# Patient Record
Sex: Male | Born: 1940 | Race: White | Hispanic: No | Marital: Married | Smoking: Never smoker
Health system: Southern US, Community
[De-identification: ages and names within clinical notes are randomized; demographics above are authoritative.]

## PROBLEM LIST (undated history)

## (undated) DIAGNOSIS — E1051 Type 1 diabetes mellitus with diabetic peripheral angiopathy without gangrene: Secondary | ICD-10-CM

## (undated) DIAGNOSIS — I739 Peripheral vascular disease, unspecified: Secondary | ICD-10-CM

## (undated) DIAGNOSIS — M869 Osteomyelitis, unspecified: Secondary | ICD-10-CM

## (undated) DIAGNOSIS — I219 Acute myocardial infarction, unspecified: Secondary | ICD-10-CM

## (undated) DIAGNOSIS — N189 Chronic kidney disease, unspecified: Secondary | ICD-10-CM

## (undated) DIAGNOSIS — D649 Anemia, unspecified: Secondary | ICD-10-CM

## (undated) DIAGNOSIS — E1065 Type 1 diabetes mellitus with hyperglycemia: Secondary | ICD-10-CM

## (undated) DIAGNOSIS — I6529 Occlusion and stenosis of unspecified carotid artery: Secondary | ICD-10-CM

## (undated) DIAGNOSIS — I10 Essential (primary) hypertension: Secondary | ICD-10-CM

## (undated) DIAGNOSIS — E785 Hyperlipidemia, unspecified: Secondary | ICD-10-CM

## (undated) DIAGNOSIS — I4891 Unspecified atrial fibrillation: Secondary | ICD-10-CM

## (undated) DIAGNOSIS — R0602 Shortness of breath: Secondary | ICD-10-CM

## (undated) DIAGNOSIS — I2510 Atherosclerotic heart disease of native coronary artery without angina pectoris: Secondary | ICD-10-CM

## (undated) DIAGNOSIS — I214 Non-ST elevation (NSTEMI) myocardial infarction: Secondary | ICD-10-CM

## (undated) DIAGNOSIS — I255 Ischemic cardiomyopathy: Secondary | ICD-10-CM

## (undated) DIAGNOSIS — N200 Calculus of kidney: Secondary | ICD-10-CM

## (undated) DIAGNOSIS — K5730 Diverticulosis of large intestine without perforation or abscess without bleeding: Secondary | ICD-10-CM

## (undated) DIAGNOSIS — I25810 Atherosclerosis of coronary artery bypass graft(s) without angina pectoris: Secondary | ICD-10-CM

## (undated) HISTORY — DX: Shortness of breath: R06.02

## (undated) HISTORY — DX: Osteomyelitis, unspecified: M86.9

## (undated) HISTORY — DX: Type 1 diabetes mellitus with diabetic peripheral angiopathy without gangrene: E10.51

## (undated) HISTORY — DX: Ischemic cardiomyopathy: I25.5

## (undated) HISTORY — PX: CORONARY ARTERY BYPASS GRAFT: SHX141

## (undated) HISTORY — DX: Occlusion and stenosis of unspecified carotid artery: I65.29

## (undated) HISTORY — DX: Type 1 diabetes mellitus with hyperglycemia: E10.65

## (undated) HISTORY — DX: Peripheral vascular disease, unspecified: I73.9

## (undated) HISTORY — PX: CARDIAC CATHETERIZATION: SHX172

## (undated) HISTORY — DX: Calculus of kidney: N20.0

## (undated) HISTORY — DX: Atherosclerosis of coronary artery bypass graft(s) without angina pectoris: I25.810

## (undated) HISTORY — PX: CATARACT EXTRACTION, BILATERAL: SHX1313

## (undated) HISTORY — DX: Diverticulosis of large intestine without perforation or abscess without bleeding: K57.30

## (undated) HISTORY — DX: Unspecified atrial fibrillation: I48.91

## (undated) HISTORY — PX: KNEE ARTHROPLASTY: SHX992

## (undated) HISTORY — DX: Acute myocardial infarction, unspecified: I21.9

## (undated) HISTORY — PX: EYE SURGERY: SHX253

## (undated) HISTORY — DX: Non-ST elevation (NSTEMI) myocardial infarction: I21.4

## (undated) HISTORY — DX: Hyperlipidemia, unspecified: E78.5

---

## 1998-11-13 HISTORY — PX: CAROTID ENDARTERECTOMY: SUR193

## 1999-03-29 ENCOUNTER — Encounter: Payer: Self-pay | Admitting: *Deleted

## 1999-03-29 ENCOUNTER — Ambulatory Visit (HOSPITAL_COMMUNITY): Admission: RE | Admit: 1999-03-29 | Discharge: 1999-03-29 | Payer: Self-pay | Admitting: Internal Medicine

## 1999-03-29 ENCOUNTER — Inpatient Hospital Stay (HOSPITAL_COMMUNITY): Admission: AD | Admit: 1999-03-29 | Discharge: 1999-04-07 | Payer: Self-pay | Admitting: *Deleted

## 1999-04-01 ENCOUNTER — Encounter: Payer: Self-pay | Admitting: Thoracic Surgery (Cardiothoracic Vascular Surgery)

## 1999-04-02 ENCOUNTER — Encounter: Payer: Self-pay | Admitting: Thoracic Surgery (Cardiothoracic Vascular Surgery)

## 1999-04-03 ENCOUNTER — Encounter: Payer: Self-pay | Admitting: *Deleted

## 1999-04-06 ENCOUNTER — Encounter: Payer: Self-pay | Admitting: Thoracic Surgery (Cardiothoracic Vascular Surgery)

## 2000-01-29 ENCOUNTER — Emergency Department (HOSPITAL_COMMUNITY): Admission: EM | Admit: 2000-01-29 | Discharge: 2000-01-29 | Payer: Self-pay | Admitting: Ophthalmology

## 2004-05-18 ENCOUNTER — Inpatient Hospital Stay (HOSPITAL_COMMUNITY): Admission: EM | Admit: 2004-05-18 | Discharge: 2004-05-20 | Payer: Self-pay

## 2009-05-03 ENCOUNTER — Ambulatory Visit: Payer: Self-pay | Admitting: Gastroenterology

## 2009-05-19 ENCOUNTER — Ambulatory Visit: Payer: Self-pay | Admitting: Gastroenterology

## 2011-02-19 LAB — GLUCOSE, CAPILLARY: Glucose-Capillary: 140 mg/dL — ABNORMAL HIGH (ref 70–99)

## 2011-03-31 NOTE — Discharge Summary (Signed)
Todd Cook, Todd Cook                      ACCOUNT NO.:  [id number]   MEDICAL RECORD NO.:  [id number]                   PATIENT TYPE:  INP   LOCATION:  4732                                 FACILITY:  [facility]   PHYSICIAN:  Erskine Speed, M.D.                DATE OF BIRTH:  1941-10-02   DATE OF ADMISSION:  05/18/2004  DATE OF DISCHARGE:  05/20/2004                                 DISCHARGE SUMMARY   PROBLEMS:  1. Diarrhea.  2. Dehydration.  3. Diabetes.  4. Gout.  5. Kidney stone.   SUBJECTIVE:  This 70 year old patient has a 2-week history of diarrhea and  now nausea and vomiting.  He was seen in the office on May 18, 2004 at 12  p.m., was pale, volume-depleted, hypotensive, and was admitted to [facility] for further evaluation.   HOSPITAL COURSE:  The patient was given IV fluids.  Laboratory testing  including a CT scan of the abdomen (negative), stool evaluation for ova and  parasite and culture and C. difficile (all negative) were performed on  admission.  His hemoglobin on admission was 16; the white count was 14,000.  Glucose was 159.  The patient had already been scheduled for a flexible  sigmoidoscopy on July 8 for colon cancer screening.  On May 20, 2004 the  patient had no nausea or vomiting, one loose stool, vital signs were stable,  the monitor showed normal sinus rhythm, and laboratory workup as noted above  was negative.  The patient was taken down to endoscopy and after standard  preparation with Fleets enemas Dr. Erskine Speed performed flexible  fiberoptic sigmoidoscopy to 75 cm.  The procedure was performed without  complications.  There was no abnormality noted.  There is no polyps, masses,  or mucosal abnormality.  As the patient was feeling much better, diarrhea  had minimized, his volume had been repleted and vital signs were stable, the  patient was ready for discharge on May 20, 2004.   DISCHARGE DIAGNOSES:  As noted above.   CONDITION:   Improved.   COMPLICATIONS:  None.   CONSULTATIONS:  None.   PROCEDURE:  Flexible fiberoptic sigmoidoscopy to 75 cm.   DISCHARGE MEDICATIONS:  The patient will be sent home on all of his usual  medicines.  Imodium if needed.  No pain management was necessary.   ACTIVITY:  As usual.   SPECIAL INSTRUCTIONS:  The patient was to be on clear liquid to full liquid  diet.  On July 9 and July 10, soft chicken baked or grilled and soft potato  could be added.  The patient was to watch his blood sugars carefully.  He  was to call on May 24, 2004 with a progress report or sooner if needed.  Erskine Speed, M.D.    Balinda Quails  D:  06/10/2004  T:  06/10/2004  Job:  [id number]
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
## 2011-09-12 ENCOUNTER — Ambulatory Visit (HOSPITAL_COMMUNITY)
Admission: RE | Admit: 2011-09-12 | Discharge: 2011-09-12 | Disposition: A | Discharge status: Home / Self Care | Payer: Medicare PPO | Source: Ambulatory Visit | Attending: Internal Medicine | Admitting: Internal Medicine

## 2011-09-12 DIAGNOSIS — M79609 Pain in unspecified limb: Secondary | ICD-10-CM | POA: Insufficient documentation

## 2011-09-12 DIAGNOSIS — I739 Peripheral vascular disease, unspecified: Secondary | ICD-10-CM | POA: Insufficient documentation

## 2011-09-12 DIAGNOSIS — L98499 Non-pressure chronic ulcer of skin of other sites with unspecified severity: Secondary | ICD-10-CM | POA: Insufficient documentation

## 2011-09-18 ENCOUNTER — Other Ambulatory Visit: Payer: Self-pay

## 2011-09-18 ENCOUNTER — Inpatient Hospital Stay (HOSPITAL_COMMUNITY)
Admission: EM | Admit: 2011-09-18 | Discharge: 2011-09-29 | DRG: 239 | Disposition: A | Discharge status: Rehabilitation Facility | Payer: Medicare PPO | Attending: Internal Medicine | Admitting: Internal Medicine

## 2011-09-18 ENCOUNTER — Encounter: Payer: Self-pay | Admitting: Emergency Medicine

## 2011-09-18 ENCOUNTER — Emergency Department (HOSPITAL_COMMUNITY): Payer: Medicare PPO

## 2011-09-18 DIAGNOSIS — I255 Ischemic cardiomyopathy: Secondary | ICD-10-CM | POA: Diagnosis present

## 2011-09-18 DIAGNOSIS — I214 Non-ST elevation (NSTEMI) myocardial infarction: Secondary | ICD-10-CM

## 2011-09-18 DIAGNOSIS — M109 Gout, unspecified: Secondary | ICD-10-CM

## 2011-09-18 DIAGNOSIS — Z7982 Long term (current) use of aspirin: Secondary | ICD-10-CM

## 2011-09-18 DIAGNOSIS — N179 Acute kidney failure, unspecified: Secondary | ICD-10-CM | POA: Diagnosis present

## 2011-09-18 DIAGNOSIS — I5022 Chronic systolic (congestive) heart failure: Secondary | ICD-10-CM | POA: Diagnosis present

## 2011-09-18 DIAGNOSIS — Z7902 Long term (current) use of antithrombotics/antiplatelets: Secondary | ICD-10-CM

## 2011-09-18 DIAGNOSIS — K5730 Diverticulosis of large intestine without perforation or abscess without bleeding: Secondary | ICD-10-CM

## 2011-09-18 DIAGNOSIS — L97509 Non-pressure chronic ulcer of other part of unspecified foot with unspecified severity: Secondary | ICD-10-CM | POA: Diagnosis present

## 2011-09-18 DIAGNOSIS — I10 Essential (primary) hypertension: Secondary | ICD-10-CM

## 2011-09-18 DIAGNOSIS — N200 Calculus of kidney: Secondary | ICD-10-CM

## 2011-09-18 DIAGNOSIS — I96 Gangrene, not elsewhere classified: Secondary | ICD-10-CM | POA: Diagnosis present

## 2011-09-18 DIAGNOSIS — M869 Osteomyelitis, unspecified: Secondary | ICD-10-CM

## 2011-09-18 DIAGNOSIS — Z9889 Other specified postprocedural states: Secondary | ICD-10-CM

## 2011-09-18 DIAGNOSIS — E871 Hypo-osmolality and hyponatremia: Secondary | ICD-10-CM | POA: Diagnosis not present

## 2011-09-18 DIAGNOSIS — E1059 Type 1 diabetes mellitus with other circulatory complications: Principal | ICD-10-CM | POA: Diagnosis present

## 2011-09-18 DIAGNOSIS — D62 Acute posthemorrhagic anemia: Secondary | ICD-10-CM | POA: Diagnosis not present

## 2011-09-18 DIAGNOSIS — I2510 Atherosclerotic heart disease of native coronary artery without angina pectoris: Secondary | ICD-10-CM | POA: Diagnosis present

## 2011-09-18 DIAGNOSIS — I798 Other disorders of arteries, arterioles and capillaries in diseases classified elsewhere: Secondary | ICD-10-CM | POA: Diagnosis present

## 2011-09-18 DIAGNOSIS — D509 Iron deficiency anemia, unspecified: Secondary | ICD-10-CM

## 2011-09-18 DIAGNOSIS — N183 Chronic kidney disease, stage 3 unspecified: Secondary | ICD-10-CM | POA: Diagnosis present

## 2011-09-18 DIAGNOSIS — I739 Peripheral vascular disease, unspecified: Secondary | ICD-10-CM

## 2011-09-18 DIAGNOSIS — Z794 Long term (current) use of insulin: Secondary | ICD-10-CM

## 2011-09-18 DIAGNOSIS — Z951 Presence of aortocoronary bypass graft: Secondary | ICD-10-CM

## 2011-09-18 DIAGNOSIS — I4891 Unspecified atrial fibrillation: Secondary | ICD-10-CM | POA: Diagnosis present

## 2011-09-18 DIAGNOSIS — IMO0002 Reserved for concepts with insufficient information to code with codable children: Secondary | ICD-10-CM

## 2011-09-18 DIAGNOSIS — I129 Hypertensive chronic kidney disease with stage 1 through stage 4 chronic kidney disease, or unspecified chronic kidney disease: Secondary | ICD-10-CM | POA: Diagnosis present

## 2011-09-18 DIAGNOSIS — E1051 Type 1 diabetes mellitus with diabetic peripheral angiopathy without gangrene: Secondary | ICD-10-CM

## 2011-09-18 DIAGNOSIS — E876 Hypokalemia: Secondary | ICD-10-CM | POA: Diagnosis not present

## 2011-09-18 DIAGNOSIS — I2589 Other forms of chronic ischemic heart disease: Secondary | ICD-10-CM | POA: Diagnosis present

## 2011-09-18 DIAGNOSIS — I509 Heart failure, unspecified: Secondary | ICD-10-CM | POA: Diagnosis present

## 2011-09-18 HISTORY — DX: Chronic kidney disease, unspecified: N18.9

## 2011-09-18 HISTORY — DX: Essential (primary) hypertension: I10

## 2011-09-18 HISTORY — DX: Diverticulosis of large intestine without perforation or abscess without bleeding: K57.30

## 2011-09-18 HISTORY — DX: Osteomyelitis, unspecified: M86.9

## 2011-09-18 HISTORY — DX: Atherosclerotic heart disease of native coronary artery without angina pectoris: I25.10

## 2011-09-18 HISTORY — DX: Reserved for concepts with insufficient information to code with codable children: IMO0002

## 2011-09-18 HISTORY — DX: Peripheral vascular disease, unspecified: I73.9

## 2011-09-18 HISTORY — DX: Non-ST elevation (NSTEMI) myocardial infarction: I21.4

## 2011-09-18 HISTORY — DX: Calculus of kidney: N20.0

## 2011-09-18 HISTORY — DX: Type 1 diabetes mellitus with diabetic peripheral angiopathy without gangrene: E10.51

## 2011-09-18 HISTORY — DX: Anemia, unspecified: D64.9

## 2011-09-18 LAB — DIFFERENTIAL
Lymphocytes Relative: 6 % — ABNORMAL LOW (ref 12–46)
Monocytes Absolute: 0.6 10*3/uL (ref 0.1–1.0)
Neutro Abs: 7 10*3/uL (ref 1.7–7.7)

## 2011-09-18 LAB — COMPREHENSIVE METABOLIC PANEL
ALT: 21 U/L (ref 0–53)
AST: 27 U/L (ref 0–37)
Albumin: 3 g/dL — ABNORMAL LOW (ref 3.5–5.2)
Alkaline Phosphatase: 92 U/L (ref 39–117)
Calcium: 9.7 mg/dL (ref 8.4–10.5)
Chloride: 102 mEq/L (ref 96–112)
GFR calc non Af Amer: 38 mL/min — ABNORMAL LOW (ref >90)
Glucose, Bld: 475 mg/dL — ABNORMAL HIGH (ref 70–99)
Total Bilirubin: 0.5 mg/dL (ref 0.3–1.2)
Total Protein: 6.2 g/dL (ref 6.0–8.3)

## 2011-09-18 LAB — CBC
Hemoglobin: 11.6 g/dL — ABNORMAL LOW (ref 13.0–17.0)
MCHC: 34.6 g/dL (ref 30.0–36.0)
RBC: 3.81 MIL/uL — ABNORMAL LOW (ref 4.22–5.81)
RDW: 13.5 % (ref 11.5–15.5)
WBC: 8 10*3/uL (ref 4.0–10.5)

## 2011-09-18 LAB — GLUCOSE, CAPILLARY
Glucose-Capillary: 463 mg/dL — ABNORMAL HIGH (ref 70–99)
Glucose-Capillary: 444 mg/dL — ABNORMAL HIGH (ref 70–99)
Glucose-Capillary: 471 mg/dL — ABNORMAL HIGH (ref 70–99)
Glucose-Capillary: 461 mg/dL — ABNORMAL HIGH (ref 70–99)

## 2011-09-18 LAB — CARDIAC PANEL(CRET KIN+CKTOT+MB+TROPI)
Relative Index: 6.6 — ABNORMAL HIGH (ref 0.0–2.5)
Relative Index: 7.2 — ABNORMAL HIGH (ref 0.0–2.5)
Total CK: 171 U/L (ref 7–232)
Troponin I: 4.47 ng/mL (ref <0.30)

## 2011-09-18 LAB — PROCALCITONIN: Procalcitonin: 1.57 ng/mL

## 2011-09-18 MED ORDER — HYDRALAZINE HCL 20 MG/ML IJ SOLN
10.0000 mg | Freq: Once | INTRAMUSCULAR | Status: DC
  Filled 2011-09-18: qty 0.5

## 2011-09-18 MED ORDER — NITROGLYCERIN 0.4 MG SL SUBL: 0.4000 mg | SUBLINGUAL_TABLET | SUBLINGUAL | Status: DC | PRN

## 2011-09-18 MED ORDER — ASPIRIN 81 MG PO CHEW
CHEWABLE_TABLET | ORAL | Status: AC
  Filled 2011-09-18: qty 1

## 2011-09-18 MED ORDER — INSULIN REGULAR HUMAN 100 UNIT/ML IJ SOLN
10.0000 [IU] | Freq: Once | INTRAMUSCULAR | Status: DC
  Filled 2011-09-18: qty 0.1

## 2011-09-18 MED ORDER — SODIUM CHLORIDE 0.9 % IV SOLN: 1000.0000 mL | INTRAVENOUS | Status: AC

## 2011-09-18 MED ORDER — PIPERACILLIN-TAZOBACTAM 3.375 G IVPB 30 MIN
3.3750 g | Freq: Once | INTRAVENOUS | Status: AC
  Administered 2011-09-22: 3.375 g via INTRAVENOUS
  Filled 2011-09-18 (×2): qty 50

## 2011-09-18 MED ORDER — INSULIN ASPART 100 UNIT/ML ~~LOC~~ SOLN
12.0000 [IU] | Freq: Once | SUBCUTANEOUS | Status: DC
  Filled 2011-09-18: qty 3

## 2011-09-18 MED ORDER — ACETAMINOPHEN 325 MG PO TABS
650.0000 mg | ORAL_TABLET | ORAL | Status: DC | PRN
  Administered 2011-09-26: 650 mg via ORAL
  Filled 2011-09-18: qty 2

## 2011-09-18 MED ORDER — SODIUM CHLORIDE 0.9 % IV SOLN
INTRAVENOUS | Status: DC
  Administered 2011-09-18: 22:00:00 via INTRAVENOUS
  Administered 2011-09-19: 125 mL via INTRAVENOUS
  Administered 2011-09-19: 09:00:00 via INTRAVENOUS

## 2011-09-18 MED ORDER — ASPIRIN 81 MG PO CHEW: 324.0000 mg | CHEWABLE_TABLET | ORAL | Status: AC

## 2011-09-18 MED ORDER — SODIUM CHLORIDE 0.9 % IJ SOLN: 3.0000 mL | Freq: Two times a day (BID) | INTRAMUSCULAR | Status: DC

## 2011-09-18 MED ORDER — ASPIRIN 325 MG PO TABS
325.0000 mg | ORAL_TABLET | Freq: Every day | ORAL | Status: DC
  Administered 2011-09-18: 273 mg via ORAL
  Administered 2011-09-19 – 2011-09-23 (×5): 325 mg via ORAL
  Filled 2011-09-18 (×5): qty 1

## 2011-09-18 MED ORDER — SODIUM CHLORIDE 0.9 % IV SOLN
INTRAVENOUS | Status: DC
  Administered 2011-09-18: 3.1 [IU]/h via INTRAVENOUS
  Administered 2011-09-19: 8.3 [IU]/h via INTRAVENOUS
  Filled 2011-09-18 (×2): qty 1

## 2011-09-18 MED ORDER — ROSUVASTATIN CALCIUM 10 MG PO TABS
10.0000 mg | ORAL_TABLET | Freq: Every day | ORAL | Status: DC
  Administered 2011-09-19 – 2011-09-29 (×11): 10 mg via ORAL
  Filled 2011-09-18 (×13): qty 1

## 2011-09-18 MED ORDER — DEXTROSE-NACL 5-0.45 % IV SOLN
INTRAVENOUS | Status: DC
  Administered 2011-09-18 – 2011-09-19 (×2): via INTRAVENOUS

## 2011-09-18 MED ORDER — HEPARIN (PORCINE) IN NACL 100-0.45 UNIT/ML-% IJ SOLN: 14.0000 [IU]/kg/h | INTRAMUSCULAR | Status: DC

## 2011-09-18 MED ORDER — METOPROLOL TARTRATE 1 MG/ML IV SOLN
5.0000 mg | Freq: Four times a day (QID) | INTRAVENOUS | Status: DC
  Administered 2011-09-19: 5 mg via INTRAVENOUS
  Filled 2011-09-18: qty 5

## 2011-09-18 MED ORDER — ALPRAZOLAM 0.25 MG PO TABS
0.2500 mg | ORAL_TABLET | Freq: Two times a day (BID) | ORAL | Status: DC | PRN
  Administered 2011-09-28 (×2): 0.25 mg via ORAL
  Filled 2011-09-18 (×2): qty 1

## 2011-09-18 MED ORDER — DEXTROSE 50 % IV SOLN: 25.0000 mL | INTRAVENOUS | Status: DC | PRN

## 2011-09-18 MED ORDER — VANCOMYCIN HCL 1000 MG IV SOLR
1500.0000 mg | INTRAVENOUS | Status: DC
  Filled 2011-09-18: qty 1500

## 2011-09-18 MED ORDER — SODIUM CHLORIDE 0.9 % IV BOLUS (SEPSIS)
2000.0000 mL | Freq: Once | INTRAVENOUS | Status: AC
  Administered 2011-09-18: 2000 mL via INTRAVENOUS

## 2011-09-18 MED ORDER — SODIUM CHLORIDE 0.9 % IV SOLN: 1000.0000 mL | INTRAVENOUS | Status: DC

## 2011-09-18 MED ORDER — HEPARIN (PORCINE) IN NACL 100-0.45 UNIT/ML-% IJ SOLN
1900.0000 [IU]/h | INTRAMUSCULAR | Status: DC
  Administered 2011-09-19: 1250 [IU]/h via INTRAVENOUS
  Administered 2011-09-19: 1550 [IU]/h via INTRAVENOUS
  Administered 2011-09-20: 1750 [IU]/h via INTRAVENOUS
  Administered 2011-09-20: 1900 [IU]/h via INTRAVENOUS
  Administered 2011-09-20: 19 mL/h
  Filled 2011-09-18 (×4): qty 250

## 2011-09-18 MED ORDER — HEPARIN BOLUS VIA INFUSION
4000.0000 [IU] | Freq: Once | INTRAVENOUS | Status: DC
  Filled 2011-09-18: qty 4000

## 2011-09-18 MED ORDER — SODIUM CHLORIDE 0.9 % IV SOLN: 250.0000 mL | INTRAVENOUS | Status: DC

## 2011-09-18 MED ORDER — ONDANSETRON HCL 4 MG/2ML IJ SOLN: 4.0000 mg | Freq: Four times a day (QID) | INTRAMUSCULAR | Status: DC | PRN

## 2011-09-18 MED ORDER — VANCOMYCIN HCL 1000 MG IV SOLR
1500.0000 mg | INTRAVENOUS | Status: DC
  Administered 2011-09-18 – 2011-09-20 (×3): 1500 mg via INTRAVENOUS
  Filled 2011-09-18 (×5): qty 1500

## 2011-09-18 MED ORDER — SODIUM CHLORIDE 0.9 % IJ SOLN: 3.0000 mL | INTRAMUSCULAR | Status: DC | PRN

## 2011-09-18 MED ORDER — SODIUM CHLORIDE 0.9 % IV BOLUS (SEPSIS)
500.0000 mL | Freq: Once | INTRAVENOUS | Status: AC
  Administered 2011-09-18: 500 mL via INTRAVENOUS

## 2011-09-18 MED ORDER — ASPIRIN 81 MG PO CHEW
CHEWABLE_TABLET | ORAL | Status: AC
  Administered 2011-09-18: 324 mg
  Filled 2011-09-18: qty 4

## 2011-09-18 MED ORDER — CLOPIDOGREL BISULFATE 75 MG PO TABS
75.0000 mg | ORAL_TABLET | Freq: Every day | ORAL | Status: DC
  Administered 2011-09-19 – 2011-09-29 (×11): 75 mg via ORAL
  Filled 2011-09-18 (×13): qty 1

## 2011-09-18 MED ORDER — ASPIRIN 300 MG RE SUPP
300.0000 mg | RECTAL | Status: DC
  Filled 2011-09-18: qty 1

## 2011-09-18 MED ORDER — INSULIN ASPART 100 UNIT/ML ~~LOC~~ SOLN
10.0000 [IU] | Freq: Once | SUBCUTANEOUS | Status: DC
  Filled 2011-09-18: qty 3

## 2011-09-18 MED ORDER — PIPERACILLIN-TAZOBACTAM 3.375 G IVPB
3.3750 g | Freq: Three times a day (TID) | INTRAVENOUS | Status: DC
  Administered 2011-09-19 – 2011-09-27 (×24): 3.375 g via INTRAVENOUS
  Filled 2011-09-18 (×29): qty 50

## 2011-09-18 MED ORDER — VANCOMYCIN HCL IN DEXTROSE 1-5 GM/200ML-% IV SOLN
1000.0000 mg | Freq: Once | INTRAVENOUS | Status: DC
  Filled 2011-09-18: qty 200
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
## 2011-09-18 NOTE — Progress Notes (Signed)
ANTICOAGULATION CONSULT NOTE - Initial Consult  Pharmacy Consult for UFH/Vancomycin/Zosyn Indication: NSTEMI/ r/o Left foot osteo  Allergies  Allergen Reactions  . Altace     Patient Measurements: Height: 5\' 10"  (177.8 cm) Weight: 198 lb (89.812 kg) IBW/kg (Calculated) : 73  Adjusted Body Weight:   Vital Signs: Temp: 97.8 F (36.6 C) (11/05 1707) Temp src: Oral (11/05 1707) BP: 138/63 mmHg (11/05 1707) Pulse Rate: 83  (11/05 1707)  Labs:  Basename 09/18/11 1420  HGB 11.6*  HCT 33.5*  PLT 150  APTT --  LABPROT --  INR --  HEPARINUNFRC --  CREATININE 1.72*  CKTOTAL --  CKMB --  TROPONINI --   Estimated Creatinine Clearance: 43.8 ml/min (by C-G formula based on Cr of 1.72).  Medical History: Past Medical History  Diagnosis Date  . Blood transfusion   . Angina   . Chronic kidney disease   . Anemia   . Diabetes mellitus   . Peripheral vascular disease   . Myocardial infarction   . Hypertension   . Coronary artery disease     Medications:   (Not in a hospital admission)  Assessment: 70 y/o male patient admitted with hyperglycemia, found to have positive cardiac enzymes x1 requiring anticoagulation for NSTEMI. Left xray shows possible osteomyelitis requiring broad spectrum abx. Received vancomycin 1g and zosyn 3.375g in ED. Noted elevated scr on admit d/t CKD. Goal of Therapy:  Heparin level 0.3-0.7 units/ml Vancomycin trough 15-20   Plan:  Heparin 4000 unit IV bolus followed by infusion at 1250 units/hr. Check 8 hour heparin level. Daily cbc and heparin level. Begin vancomycin 1500mg  IV q24h with zosyn 3.375g iv q8h. Will monitor renal fxn.  Leodis Binet, Venora Maples 09/18/2011,5:23 PM

## 2011-09-18 NOTE — ED Provider Notes (Signed)
Patient to hold in CDU pending admission.  Calls for consult and admission have been made by Dr Fonnie Jarvis.  I have called to follow up with hospitalist, who has requested recheck of CBG and BP and ordering insulin and hydralazine.  I have put the orders in EPIC and have discussed the checking prior to giving medications with nurse.  Hospitalist has already seen patient and admission has been arranged.    Todd Cook, [place] 09/18/11 1651

## 2011-09-18 NOTE — Progress Notes (Signed)
Had consultation with regards to patient with Dr. Ladona Ridgel of cardiology.  Patient does not require systemic anticoagulation at this time per Dr. and patient has not had CK CK-MB drawn as yet. I've ordered a stat.  Patient will be reviewed in the morning by cardiology-they will determine at that point whether catheterization is necessary versus other intervention. At that point in time when we have more data with regards to end STEMI we will then determine the appropriateness of vascular surgeon's input in patient's care  [facility]

## 2011-09-18 NOTE — ED Notes (Signed)
To confirm medication given aspirin 324 mg PO

## 2011-09-18 NOTE — Progress Notes (Addendum)
ANTICOAGULATION CONSULT NOTE - Follow Up Consult  Pharmacy Consult for heparin  Indication: chest pain/ACS  Allergies  Allergen Reactions  . Altace     Patient Measurements: Height: 5\' 10"  (177.8 cm) Weight: 216 lb 11.4 oz (98.3 kg) IBW/kg (Calculated) : 73  Adjusted Body Weight:   Vital Signs: Temp: 97.8 F (36.6 C) (11/05 2103) Temp src: Oral (11/05 2103) BP: 158/66 mmHg (11/05 2103) Pulse Rate: 98  (11/05 2103)  Labs:  Basename 09/18/11 1901 09/18/11 1420  HGB -- 11.6*  HCT -- 33.5*  PLT -- 150  APTT -- --  LABPROT -- --  INR -- --  HEPARINUNFRC -- --  CREATININE -- 1.72*  CKTOTAL 185 --  CKMB 12.3* --  TROPONINI 4.47* --   Estimated Creatinine Clearance: 45.6 ml/min (by C-G formula based on Cr of 1.72).   Medications:  Prescriptions prior to admission  Medication Sig Dispense Refill  . allopurinol (ZYLOPRIM) 300 MG tablet Take 300 mg by mouth daily.        Marland Kitchen aspirin EC 81 MG tablet Take 81 mg by mouth daily.        . colchicine 0.6 MG tablet Take 0.6 mg by mouth daily.        Marland Kitchen glimepiride (AMARYL) 4 MG tablet Take 4 mg by mouth 2 (two) times daily.        Marland Kitchen losartan (COZAAR) 100 MG tablet Take 100 mg by mouth daily.        . simvastatin (ZOCOR) 20 MG tablet Take 20 mg by mouth at bedtime.        . sitaGLIPtin (JANUVIA) 100 MG tablet Take 100 mg by mouth daily.          Assessment: 70 yom who heparin was stopped then being resumed for elevated troponins.  Heparin not documented on MAR as started in ED.  Goal of Therapy:  Heparin level 0.3-0.7 units/ml   Plan:  Start heparin heparin gtt at 1250 units/hr, check 8h heparin level with daily CBC and heparin level. Dannielle Huh 09/18/2011,10:38 PM

## 2011-09-18 NOTE — ED Notes (Signed)
Received call from lab with critical results.  DR. Mahala Menghini paged at [phone].

## 2011-09-18 NOTE — ED Provider Notes (Signed)
History     CSN: [id number] Arrival date & time: 09/18/2011  1:50 PM   First MD Initiated Contact with Patient 09/18/11 1427      Chief Complaint  Patient presents with  . Hyperglycemia    (Consider location/radiation/quality/duration/timing/severity/associated sxs/prior treatment) HPI This 70 year old male has a history of diabetes with peripheral neuropathy with numbness to both feet at baseline. 3 weeks ago he developed a blister on the plantar aspect of his left forefoot at the first metatarsal phalangeal region.  About 2 weeks ago his doctor unroofed the blister at that part of the foot. About a week ago the patient's foot became red or he has the ulcer which has been debrided by his doctor. Over the last week the patient has been on oral antibiotics for localized cellulitis with infected diabetic foot ulcer. Today the patient developed a fever to 101 with a blood sugar over 307 patient was sent to the hospital for admission. [facility] service is going to admit the patient to their service however the patient has an abnormal ECG in the emergency department. The patient is currently pain free but had about a 20 minute episode of vague chest discomfort earlier today, as well as about a two-hour episode of a vague chest discomfort yesterday. Had vague chest discomfort was very mild and nonradiating and without associated symptoms and the patient describes it as a feeling of mild indigestion but resolved very quickly with TUMS yesterday and again today.  The patient denies any pain currently to his chest area or to his foot. Patient has had no altered mental status, dizziness, shortness of breath, cough, abdominal pain, vomiting, or new localized weakness or numbness. Past Medical History  Diagnosis Date  . Blood transfusion   . Angina   . Chronic kidney disease   . Anemia   . Diabetes mellitus   . Peripheral vascular disease   . Hypertension   . Coronary artery disease   .  Myocardial infarction   . Shortness of breath   . Gout     left foot    Past Surgical History  Procedure Date  . 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 Knee arthroplasty     2000  . Cataract extraction, bilateral   . Eye surgery   . Cardiac catheterization     History reviewed. No pertinent family history.  History  Substance Use Topics  . Smoking status: Never Smoker   . Smokeless tobacco: Not on file  . Alcohol Use: No      Review of Systems  Constitutional: Positive for fever.       10 Systems reviewed and are negative for acute change except as noted in the HPI.  HENT: Negative for congestion.   Eyes: Negative for discharge and redness.  Respiratory: Negative for cough and shortness of breath.   Cardiovascular: Positive for chest pain. Negative for leg swelling.  Gastrointestinal: Negative for vomiting and abdominal pain.  Musculoskeletal: Negative for back pain.  Skin: Negative for rash.  Neurological: Negative for syncope, numbness and headaches.  Psychiatric/Behavioral:       No behavior change.    Allergies  Altace  Home Medications   No current outpatient prescriptions on file.  BP 114/43  Pulse 92  Temp(Src) 98.5 F (36.9 C) (Oral)  Resp 23  Ht 5\' 10"  (1.778 m)  Wt 227 lb 1.2 oz (103 kg)  BMI 32.58 kg/m2  SpO2 94%  Physical Exam  Nursing note and vitals  reviewed. Constitutional:       Awake, alert, nontoxic appearance.  HENT:  Head: Atraumatic.  Eyes: Right eye exhibits no discharge. Left eye exhibits no discharge.  Neck: Neck supple.  Cardiovascular: Normal rate and regular rhythm.   No murmur heard. Pulmonary/Chest: Effort normal and breath sounds normal. No respiratory distress. He has no wheezes. He has no rales. He exhibits no tenderness.  Abdominal: Soft. There is no tenderness. There is no rebound.  Musculoskeletal: Normal range of motion. He exhibits no tenderness.       Baseline ROM, no obvious new focal weakness.  The  patient's left foot has approximately 2 cm diameter plantar ulcer over the first metatarsal phalangeal region with surrounding localized cellulitis to the left foot without tenderness or purulent drainage or palpable subcutaneous crepitus, he has baseline numbness to both feet and he does have capillary refill less than 2 seconds to his toes of his left foot with baseline range of motion to his left foot  Neurological: He is alert.       Mental status and motor strength appears baseline for patient and situation.  Skin: No rash noted.  Psychiatric: He has a normal mood and affect.    ED Course  Procedures (including critical care time)  Case discussed with [facility] and cardiology with cardiology acting as consultant and [facility] for admission. The patient remains stable in the ED.  ECG: Normal sinus rhythm with ventricular rate 90 beats per minute, normal axis, normal intervals, 1 mm ST depression in the inferior lateral leads which is more evident compared with July 2005 ECG with nonspecific T waves changes as well Labs Reviewed  GLUCOSE, CAPILLARY - Abnormal; Notable for the following:    Glucose-Capillary 416 (*)    All other components within normal limits  CBC - Abnormal; Notable for the following:    RBC 3.81 (*)    Hemoglobin 11.6 (*)    HCT 33.5 (*)    All other components within normal limits  DIFFERENTIAL - Abnormal; Notable for the following:    Neutrophils Relative 87 (*)    Lymphocytes Relative 6 (*)    Lymphs Abs 0.4 (*)    All other components within normal limits  COMPREHENSIVE METABOLIC PANEL - Abnormal; Notable for the following:    Sodium 134 (*)    CO2 17 (*)    Glucose, Bld 475 (*)    BUN 44 (*)    Creatinine, Ser 1.72 (*)    Albumin 3.0 (*)    GFR calc non Af Amer 38 (*)    GFR calc Af Amer 44 (*)    All other components within normal limits  URINALYSIS, ROUTINE W REFLEX MICROSCOPIC - Abnormal; Notable for the following:    Appearance HAZY (*)     Glucose, UA >1000 (*)    All other components within normal limits  LACTIC ACID, PLASMA - Abnormal; Notable for the following:    Lactic Acid, Venous 2.4 (*)    All other components within normal limits  POCT I-STAT TROPONIN I - Abnormal; Notable for the following:    Troponin i, poc 2.24 (*)    All other components within normal limits  CBC - Abnormal; Notable for the following:    RBC 3.71 (*)    Hemoglobin 11.0 (*)    HCT 32.4 (*)    Platelets 133 (*)    All other components within normal limits  BASIC METABOLIC PANEL - Abnormal; Notable for the following:  CO2 17 (*)    Glucose, Bld 164 (*)    BUN 40 (*)    Creatinine, Ser 1.74 (*)    GFR calc non Af Amer 37 (*)    GFR calc Af Amer 43 (*)    All other components within normal limits  GLUCOSE, CAPILLARY - Abnormal; Notable for the following:    Glucose-Capillary 463 (*)    All other components within normal limits  CARDIAC PANEL(CRET KIN+CKTOT+MB+TROPI) - Abnormal; Notable for the following:    CK, MB 12.3 (*)    Troponin I 4.47 (*)    Relative Index 6.6 (*)    All other components within normal limits  GLUCOSE, CAPILLARY - Abnormal; Notable for the following:    Glucose-Capillary 444 (*)    All other components within normal limits  GLUCOSE, CAPILLARY - Abnormal; Notable for the following:    Glucose-Capillary 471 (*)    All other components within normal limits  CARDIAC PANEL(CRET KIN+CKTOT+MB+TROPI) - Abnormal; Notable for the following:    CK, MB 12.3 (*) CRITICAL VALUE NOTED.  VALUE IS CONSISTENT WITH PREVIOUSLY REPORTED AND CALLED VALUE.   Troponin I 4.44 (*)    Relative Index 7.2 (*)    All other components within normal limits  CARDIAC PANEL(CRET KIN+CKTOT+MB+TROPI) - Abnormal; Notable for the following:    CK, MB 10.5 (*) CRITICAL VALUE NOTED.  VALUE IS CONSISTENT WITH PREVIOUSLY REPORTED AND CALLED VALUE.   Troponin I 5.58 (*)    Relative Index 6.1 (*)    All other components within normal limits  CARDIAC  PANEL(CRET KIN+CKTOT+MB+TROPI) - Abnormal; Notable for the following:    CK, MB 12.4 (*) CRITICAL VALUE NOTED.  VALUE IS CONSISTENT WITH PREVIOUSLY REPORTED AND CALLED VALUE.   Troponin I 8.48 (*)    Relative Index 6.9 (*)    All other components within normal limits  GLUCOSE, CAPILLARY - Abnormal; Notable for the following:    Glucose-Capillary 460 (*)    All other components within normal limits  HEPARIN LEVEL - Abnormal; Notable for the following:    Heparin Unfractionated <0.10 (*)    All other components within normal limits  GLUCOSE, CAPILLARY - Abnormal; Notable for the following:    Glucose-Capillary 461 (*)    All other components within normal limits  GLUCOSE, CAPILLARY - Abnormal; Notable for the following:    Glucose-Capillary 295 (*)    All other components within normal limits  GLUCOSE, CAPILLARY - Abnormal; Notable for the following:    Glucose-Capillary 232 (*)    All other components within normal limits  GLUCOSE, CAPILLARY - Abnormal; Notable for the following:    Glucose-Capillary 175 (*)    All other components within normal limits  GLUCOSE, CAPILLARY - Abnormal; Notable for the following:    Glucose-Capillary 198 (*)    All other components within normal limits  GLUCOSE, CAPILLARY - Abnormal; Notable for the following:    Glucose-Capillary 168 (*)    All other components within normal limits  GLUCOSE, CAPILLARY - Abnormal; Notable for the following:    Glucose-Capillary 166 (*)    All other components within normal limits  GLUCOSE, CAPILLARY - Abnormal; Notable for the following:    Glucose-Capillary 157 (*)    All other components within normal limits  GLUCOSE, CAPILLARY - Abnormal; Notable for the following:    Glucose-Capillary 139 (*)    All other components within normal limits  GLUCOSE, CAPILLARY - Abnormal; Notable for the following:    Glucose-Capillary 148 (*)  All other components within normal limits  URINE MICROSCOPIC-ADD ON - Abnormal;  Notable for the following:    Casts GRANULAR CAST (*)    All other components within normal limits  GLUCOSE, CAPILLARY - Abnormal; Notable for the following:    Glucose-Capillary 133 (*)    All other components within normal limits  HEPARIN LEVEL - Abnormal; Notable for the following:    Heparin Unfractionated 0.21 (*)    All other components within normal limits  GLUCOSE, CAPILLARY - Abnormal; Notable for the following:    Glucose-Capillary 213 (*)    All other components within normal limits  GLUCOSE, CAPILLARY - Abnormal; Notable for the following:    Glucose-Capillary 185 (*)    All other components within normal limits  HEPARIN LEVEL - Abnormal; Notable for the following:    Heparin Unfractionated 0.24 (*)    All other components within normal limits  CBC - Abnormal; Notable for the following:    WBC 11.3 (*)    RBC 3.72 (*)    Hemoglobin 11.1 (*)    HCT 32.4 (*)    Platelets 140 (*)    All other components within normal limits  PROTIME-INR - Abnormal; Notable for the following:    Prothrombin Time 16.0 (*)    All other components within normal limits  CBC - Abnormal; Notable for the following:    RBC 3.54 (*)    Hemoglobin 10.5 (*)    HCT 31.1 (*)    Platelets 135 (*)    All other components within normal limits  GLUCOSE, CAPILLARY - Abnormal; Notable for the following:    Glucose-Capillary 268 (*)    All other components within normal limits  GLUCOSE, CAPILLARY - Abnormal; Notable for the following:    Glucose-Capillary 256 (*)    All other components within normal limits  GLUCOSE, CAPILLARY - Abnormal; Notable for the following:    Glucose-Capillary 269 (*)    All other components within normal limits  BASIC METABOLIC PANEL - Abnormal; Notable for the following:    Sodium 134 (*)    CO2 16 (*)    Glucose, Bld 284 (*)    BUN 47 (*)    Creatinine, Ser 1.79 (*)    GFR calc non Af Amer 36 (*)    GFR calc Af Amer 42 (*)    All other components within normal  limits  GLUCOSE, CAPILLARY - Abnormal; Notable for the following:    Glucose-Capillary 253 (*)    All other components within normal limits  GLUCOSE, CAPILLARY - Abnormal; Notable for the following:    Glucose-Capillary 241 (*)    All other components within normal limits  GLUCOSE, CAPILLARY - Abnormal; Notable for the following:    Glucose-Capillary 184 (*)    All other components within normal limits  GLUCOSE, CAPILLARY - Abnormal; Notable for the following:    Glucose-Capillary 267 (*)    All other components within normal limits  CULTURE, BLOOD (ROUTINE X 2)  PROCALCITONIN  URINE CULTURE  CULTURE, BLOOD (ROUTINE SINGLE)  MRSA PCR SCREENING  POCT ACTIVATED CLOTTING TIME  BRAIN NATRIURETIC PEPTIDE  POCT CBG MONITORING  CBC  BASIC METABOLIC PANEL  POCT CBG MONITORING   Dg Chest Portable 1 View  09/20/2011  *RADIOLOGY REPORT*  Clinical Data: Shortness of breath.  PORTABLE CHEST - 1 VIEW  Comparison: None.  Findings: Cardiomegaly.  Central vascular congestion.  Interval development of right greater than left airspace opacities.  Status post median sternotomy and CABG.  No acute osseous abnormality.  No pneumothorax.  There may be small pleural effusions.  IMPRESSION: Interval development of right greater than left airspace opacities. The rapid onset favors a process such as asymmetric edema.  Original Report Authenticated By: Waneta Martins, M.D.     1. Non-STEMI (non-ST elevated myocardial infarction)   Infected diabetic foot ulcer   MDM  Doubt STEMI or sepsis.  D/w Cards and [facility] multiple times during ED stay.        Hurman Horn, MD 09/20/11 2241

## 2011-09-18 NOTE — ED Notes (Signed)
Report called to Nurse receiving pt to bed 2924.  Tereso Newcomer, Charity fundraiser.  All questions answered.  Pt to floor with monitor intact.  NAD noted.  No answer received from Dr. Mahala Menghini regarding abnormal results. Dr. Luberta Robertson again.

## 2011-09-18 NOTE — ED Notes (Signed)
Onset three weeks ago developed three weeks ago blister bottom of foot.  Overtime blister worsening and light pink yellow and black.  Sent to ED for evaluation.  Patient denies any chest pain or shortness of breath. Denies any pain. Pedal pulse +1 bilateral.

## 2011-09-18 NOTE — Consult Note (Signed)
Todd Cook is an 70 y.o. male.   Chief Complaint: "I was having fevers and chills in my foot hurts" HPI: The patient is a very pleasant 70 year old man with long-standing diabetes and coronary artery disease. He underwent bypass surgery in 2000. Since then he has done well from a cardiac perspective. Until a few days ago he remained active able to walk and get around with minimal difficulty. Approximately 2 weeks ago he noted drainage and an ulcer on his left foot. This has subsequently worsened. He's had ongoing drainage despite debridement from his foot. 2 days ago the patient had a shaking fever and chill which ultimately got better. Today he had a similar episode. The patient has complained others about chest discomfort but denies these symptoms when I ask him about his symptoms. He further denies shortness of breath, nausea, vomiting, or diaphoresis. He denies syncope. In the emergency room, he was noted to have an elevated troponin. We are consulted for additional evaluation and recommendation.  Past Medical History  Diagnosis Date  . Blood transfusion   . Angina   . Chronic kidney disease   . Anemia   . Diabetes mellitus   . Peripheral vascular disease   . Myocardial infarction   . Hypertension   . Coronary artery disease     Past Surgical History  Procedure Date  . 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 Knee arthroplasty     2000    History reviewed. No pertinent family history. He denies premature coronary disease. Social History:  reports that he has never smoked. He does not have any smokeless tobacco history on file. His alcohol and drug histories not on file.  Allergies:  Allergies  Allergen Reactions  . Altace     Medications Prior to Admission  Medication Dose Route Frequency Provider Last Rate Last Dose  . 0.9 %  sodium chloride infusion  1,000 mL Intravenous STAT Hurman Horn, MD      . 0.9 %  sodium chloride infusion  250 mL Intravenous  Continuous Pleas Koch      . acetaminophen (TYLENOL) tablet 650 mg  650 mg Oral Q4H PRN Pleas Koch      . ALPRAZolam Prudy Feeler) tablet 0.25 mg  0.25 mg Oral BID PRN Pleas Koch      . aspirin 81 MG chewable tablet        324 mg at 09/18/11 1455  . aspirin chewable tablet 324 mg  324 mg Oral NOW Jai Samtani       Or  . aspirin suppository 300 mg  300 mg Rectal NOW Pleas Koch      . aspirin tablet 325 mg  325 mg Oral Daily Hurman Horn, MD   273 mg at 09/18/11 1500  . clopidogrel (PLAVIX) tablet 75 mg  75 mg Oral Q breakfast Pleas Koch      . hydrALAZINE (APRESOLINE) injection 10 mg  10 mg Intravenous Once Emily B West, PA      . insulin regular (HUMULIN R,NOVOLIN R) 100 units/mL injection 10 Units  10 Units Subcutaneous Once Emily B West, PA      . metoprolol (LOPRESSOR) injection 5 mg  5 mg Intravenous Q6H Jai Samtani      . nitroGLYCERIN (NITROSTAT) SL tablet 0.4 mg  0.4 mg Sublingual Q5 min PRN Pleas Koch      . ondansetron (ZOFRAN) injection 4 mg  4 mg Intravenous Q6H PRN Pleas Koch      .  piperacillin-tazobactam (ZOSYN) IVPB 3.375 g  3.375 g Intravenous Once Hurman Horn, MD      . rosuvastatin (CRESTOR) tablet 10 mg  10 mg Oral Daily Pleas Koch      . sodium chloride 0.9 % bolus 2,000 mL  2,000 mL Intravenous Once Hurman Horn, MD   2,000 mL at 09/18/11 1709  . sodium chloride 0.9 % bolus 500 mL  500 mL Intravenous Once Hurman Horn, MD   500 mL at 09/18/11 1445  . sodium chloride 0.9 % injection 3 mL  3 mL Intravenous Q12H Jai Samtani      . sodium chloride 0.9 % injection 3 mL  3 mL Intravenous PRN Pleas Koch      . vancomycin (VANCOCIN) IVPB 1000 mg/200 mL premix  1,000 mg Intravenous Once Hurman Horn, MD      . DISCONTD: 0.9 %  sodium chloride infusion  1,000 mL Intravenous STAT Hurman Horn, MD      . DISCONTD: aspirin 81 MG chewable tablet           . DISCONTD: insulin aspart (novoLOG) injection 12 Units  12 Units Subcutaneous Once Hurman Horn, MD       No current  outpatient prescriptions on file as of 09/18/2011.    Results for orders placed during the hospital encounter of 09/18/11 (from the past 48 hour(s))  GLUCOSE, CAPILLARY     Status: Abnormal   Collection Time   09/18/11  1:45 PM      Component Value Range Comment   Glucose-Capillary 416 (*) 70 - 99 (mg/dL)   CBC     Status: Abnormal   Collection Time   09/18/11  2:20 PM      Component Value Range Comment   WBC 8.0  4.0 - 10.5 (K/uL)    RBC 3.81 (*) 4.22 - 5.81 (MIL/uL)    Hemoglobin 11.6 (*) 13.0 - 17.0 (g/dL)    HCT 16.1 (*) 09.6 - 52.0 (%)    MCV 87.9  78.0 - 100.0 (fL)    MCH 30.4  26.0 - 34.0 (pg)    MCHC 34.6  30.0 - 36.0 (g/dL)    RDW 04.5  40.9 - 81.1 (%)    Platelets 150  150 - 400 (K/uL)   DIFFERENTIAL     Status: Abnormal   Collection Time   09/18/11  2:20 PM      Component Value Range Comment   Neutrophils Relative 87 (*) 43 - 77 (%)    Neutro Abs 7.0  1.7 - 7.7 (K/uL)    Lymphocytes Relative 6 (*) 12 - 46 (%)    Lymphs Abs 0.4 (*) 0.7 - 4.0 (K/uL)    Monocytes Relative 7  3 - 12 (%)    Monocytes Absolute 0.6  0.1 - 1.0 (K/uL)    Eosinophils Relative 0  0 - 5 (%)    Eosinophils Absolute 0.0  0.0 - 0.7 (K/uL)    Basophils Relative 0  0 - 1 (%)    Basophils Absolute 0.0  0.0 - 0.1 (K/uL)   COMPREHENSIVE METABOLIC PANEL     Status: Abnormal   Collection Time   09/18/11  2:20 PM      Component Value Range Comment   Sodium 134 (*) 135 - 145 (mEq/L)    Potassium 5.0  3.5 - 5.1 (mEq/L)    Chloride 102  96 - 112 (mEq/L)    CO2 17 (*) 19 - 32 (mEq/L)  Glucose, Bld 475 (*) 70 - 99 (mg/dL)    BUN 44 (*) 6 - 23 (mg/dL)    Creatinine, Ser 1.61 (*) 0.50 - 1.35 (mg/dL)    Calcium 9.7  8.4 - 10.5 (mg/dL)    Total Protein 6.2  6.0 - 8.3 (g/dL)    Albumin 3.0 (*) 3.5 - 5.2 (g/dL)    AST 27  0 - 37 (U/L)    ALT 21  0 - 53 (U/L)    Alkaline Phosphatase 92  39 - 117 (U/L)    Total Bilirubin 0.5  0.3 - 1.2 (mg/dL)    GFR calc non Af Amer 38 (*) >90 (mL/min)    GFR calc Af Amer  44 (*) >90 (mL/min)   PROCALCITONIN     Status: Normal   Collection Time   09/18/11  2:29 PM      Component Value Range Comment   Procalcitonin 1.57     LACTIC ACID, PLASMA     Status: Abnormal   Collection Time   09/18/11  2:31 PM      Component Value Range Comment   Lactic Acid, Venous 2.4 (*) 0.5 - 2.2 (mmol/L)   POCT I-STAT TROPONIN I     Status: Abnormal   Collection Time   09/18/11  2:47 PM      Component Value Range Comment   Troponin i, poc 2.24 (*) 0.00 - 0.08 (ng/mL)    Comment NOTIFIED PHYSICIAN      Comment 3             Dg Chest 2 View  09/18/2011  *RADIOLOGY REPORT*  Clinical Data: Diabetic left foot ulcerations/abscess.  Prior CABG.  CHEST - 2 VIEW 09/18/2011:  Comparison: None.  Findings: Prior sternotomy for CABG.  Cardiac silhouette mildly enlarged.  Thoracic aorta tortuous and atherosclerotic.  Hilar and mediastinal contours otherwise unremarkable.  Suboptimal inspiration.  Pulmonary venous hypertension without overt edema. Lungs clear.  No pleural effusions.  Degenerative changes and DISH involving the thoracic spine.  IMPRESSION: Suboptimal inspiration.  Mild cardiomegaly.  Pulmonary venous hypertension without overt edema.  No acute cardiopulmonary disease.  Original Report Authenticated By: Arnell Sieving, M.D.   Dg Foot Complete Left  09/18/2011  *RADIOLOGY REPORT*  Clinical Data: Ulcer at plantar aspect left foot with abscess  LEFT FOOT - COMPLETE 3+ VIEW  Comparison: None  Findings: Marked osseous demineralization. Scattered small vessel atherosclerotic calcifications. Joint spaces preserved. Soft tissue gas at great toe. Assessment of cortical integrity is suboptimal due to degree of bony demineralization. No definite acute fracture, dislocation or bone destruction identified. It is difficult to exclude destruction at the lateral aspect of the first metatarsal head due to degree of demineralization.  IMPRESSION: Soft tissue gas at great toe consistent with  infection. No definite acute bony abnormalities identified, though the head of the first metatarsal is difficult to assess. If osteomyelitis is clinically suspected, or assessment of extent of soft tissue infection is required, recommend MR imaging with and without contrast.  Original Report Authenticated By: Lollie Marrow, M.D.    Review of systems - all systems reviewed and negative except as noted in the history of present illness.  Blood pressure 138/63, pulse 83, temperature 97.8 F (36.6 C), temperature source Oral, resp. rate 20, height 5\' 10"  (1.778 m), weight 198 lb (89.812 kg), SpO2 99.00%. Well appearing NAD HEENT: Unremarkable Neck:  No JVD, no thyromegally Lungs:  Clear except for basilar rales. No wheezes or rhonchi are present. HEART:  Regular rate rhythm, no murmurs, no rubs. A soft S4 gallop is present.  Abd:  Flat, positive bowel sounds, no organomegally, no rebound, no guarding Ext:  pulses in the feet are markedly reduced. His left and right popliteal pulse is palpable but reduced. Skin:  No rashes no nodules Neuro:  CN II through XII intact, motor grossly intact  EKG: . Normal sinus rhythm with delayed R-wave progression and ST-T wave abnormality consistent with inferolateral ischemia.  Assessment/Plan Principal Problem:  *Non-STEMI (non-ST elevated myocardial infarction) - despite his multiple cardiac risks and despite his elevated troponin, the patient denies symptoms on my history and examination. For now recommend continuing serial cardiac enzymes and EKGs. I would continue aspirin and Plavix. I am not convinced heparin is warranted at the present time. His elevated troponins could be related to peripheral vascular disease. We'll plan to follow his EKGs and symptoms. I will obtain 2-D echo both to evaluate left ventricular dysfunction as well as to make sure he has no evidence of endocarditis.  Active Problems:  Osteomyelitis - the patient has severe peripheral  vascular disease and has evidence of osteomyelitis likely secondary to his severe vascular disease. He will evaluation for this and likely peripheral vascular studies. I will ask my peripheral vascular specialist to help in this regard. He has been started on antibiotics to cover staph and strep which I think is appropriate at this time. His foot demonstrates evidence of gangrene. He may ultimately require amputation.       Lewayne Bunting 09/18/2011, 5:19 PM

## 2011-09-18 NOTE — Progress Notes (Signed)
Paged by ED RN who I was not able to get up with  Spoke with Accepting nurse Informed her Cardiologist on call need to address this issue, but I will alert [facility] on call to assess patient in meanwhile.  Likely evolving NSTEMI?  Spoke with Dr. Mikeal Hawthorne, and informed that per my earlier discussion with Dr. Ladona Ridgel of Cardiology, patient likely needs Heparin re-started and eventually might need Cardiac Catheterization  Rest of decision making deferred to cardiology  [facility]

## 2011-09-19 ENCOUNTER — Other Ambulatory Visit: Payer: Self-pay

## 2011-09-19 ENCOUNTER — Encounter (HOSPITAL_COMMUNITY): Payer: Self-pay | Admitting: *Deleted

## 2011-09-19 DIAGNOSIS — M79609 Pain in unspecified limb: Secondary | ICD-10-CM

## 2011-09-19 DIAGNOSIS — I200 Unstable angina: Secondary | ICD-10-CM

## 2011-09-19 LAB — URINE MICROSCOPIC-ADD ON

## 2011-09-19 LAB — GLUCOSE, CAPILLARY
Glucose-Capillary: 139 mg/dL — ABNORMAL HIGH (ref 70–99)
Glucose-Capillary: 148 mg/dL — ABNORMAL HIGH (ref 70–99)
Glucose-Capillary: 168 mg/dL — ABNORMAL HIGH (ref 70–99)
Glucose-Capillary: 175 mg/dL — ABNORMAL HIGH (ref 70–99)
Glucose-Capillary: 198 mg/dL — ABNORMAL HIGH (ref 70–99)
Glucose-Capillary: 295 mg/dL — ABNORMAL HIGH (ref 70–99)

## 2011-09-19 LAB — CBC
HCT: 32.4 % — ABNORMAL LOW (ref 39.0–52.0)
HCT: 31.1 % — ABNORMAL LOW (ref 39.0–52.0)
Hemoglobin: 11 g/dL — ABNORMAL LOW (ref 13.0–17.0)
Hemoglobin: 10.5 g/dL — ABNORMAL LOW (ref 13.0–17.0)
MCH: 29.7 pg (ref 26.0–34.0)
MCHC: 34 g/dL (ref 30.0–36.0)
MCHC: 33.8 g/dL (ref 30.0–36.0)
MCV: 87.9 fL (ref 78.0–100.0)
RDW: 14 % (ref 11.5–15.5)

## 2011-09-19 LAB — URINALYSIS, ROUTINE W REFLEX MICROSCOPIC
Bilirubin Urine: NEGATIVE
Ketones, ur: NEGATIVE mg/dL
Nitrite: NEGATIVE
Urobilinogen, UA: 0.2 mg/dL (ref 0.0–1.0)

## 2011-09-19 LAB — BASIC METABOLIC PANEL
BUN: 40 mg/dL — ABNORMAL HIGH (ref 6–23)
Chloride: 108 mEq/L (ref 96–112)
GFR calc non Af Amer: 37 mL/min — ABNORMAL LOW (ref >90)
Glucose, Bld: 164 mg/dL — ABNORMAL HIGH (ref 70–99)
Potassium: 4.2 mEq/L (ref 3.5–5.1)

## 2011-09-19 LAB — CARDIAC PANEL(CRET KIN+CKTOT+MB+TROPI)
Relative Index: 6.1 — ABNORMAL HIGH (ref 0.0–2.5)
Relative Index: 6.9 — ABNORMAL HIGH (ref 0.0–2.5)
Total CK: 180 U/L (ref 7–232)
Troponin I: 5.58 ng/mL (ref <0.30)
Troponin I: 8.48 ng/mL (ref <0.30)

## 2011-09-19 LAB — HEPARIN LEVEL (UNFRACTIONATED): Heparin Unfractionated: 0.21 IU/mL — ABNORMAL LOW (ref 0.30–0.70)

## 2011-09-19 MED ORDER — METOPROLOL TARTRATE 12.5 MG HALF TABLET
12.5000 mg | ORAL_TABLET | Freq: Two times a day (BID) | ORAL | Status: DC
  Administered 2011-09-19 – 2011-09-21 (×5): 12.5 mg via ORAL
  Filled 2011-09-19 (×7): qty 1

## 2011-09-19 MED ORDER — SODIUM CHLORIDE 0.9 % IJ SOLN
3.0000 mL | Freq: Two times a day (BID) | INTRAMUSCULAR | Status: DC
  Administered 2011-09-21 – 2011-09-29 (×11): 3 mL via INTRAVENOUS

## 2011-09-19 MED ORDER — SODIUM CHLORIDE 0.9 % IV SOLN: 250.0000 mL | INTRAVENOUS | Status: DC

## 2011-09-19 MED ORDER — SODIUM CHLORIDE 0.9 % IV SOLN
1.0000 mL/kg/h | INTRAVENOUS | Status: DC
  Administered 2011-09-20: 1 mL/kg/h via INTRAVENOUS

## 2011-09-19 MED ORDER — SODIUM CHLORIDE 0.9 % IJ SOLN: 3.0000 mL | INTRAMUSCULAR | Status: DC | PRN

## 2011-09-19 MED ORDER — METOPROLOL TARTRATE 25 MG PO TABS
25.0000 mg | ORAL_TABLET | Freq: Four times a day (QID) | ORAL | Status: DC
  Administered 2011-09-19: 25 mg via ORAL
  Filled 2011-09-19: qty 1

## 2011-09-19 MED ORDER — METOPROLOL TARTRATE 1 MG/ML IV SOLN
5.0000 mg | INTRAVENOUS | Status: AC
  Administered 2011-09-19: 5 mg via INTRAVENOUS
  Filled 2011-09-19: qty 5

## 2011-09-19 MED ORDER — HEPARIN BOLUS VIA INFUSION
3000.0000 [IU] | Freq: Once | INTRAVENOUS | Status: AC
  Administered 2011-09-19: 3000 [IU] via INTRAVENOUS

## 2011-09-19 MED ORDER — INSULIN ASPART 100 UNIT/ML ~~LOC~~ SOLN
0.0000 [IU] | SUBCUTANEOUS | Status: DC
  Administered 2011-09-19: 5 [IU] via SUBCUTANEOUS
  Administered 2011-09-19: 3 [IU] via SUBCUTANEOUS
  Administered 2011-09-19: 2 [IU] via SUBCUTANEOUS
  Administered 2011-09-19: 1 [IU] via SUBCUTANEOUS
  Administered 2011-09-20 (×2): 5 [IU] via SUBCUTANEOUS
  Administered 2011-09-20: 3 [IU] via SUBCUTANEOUS
  Administered 2011-09-20: 5 [IU] via SUBCUTANEOUS
  Filled 2011-09-19: qty 3

## 2011-09-19 MED ORDER — HEPARIN BOLUS VIA INFUSION
1500.0000 [IU] | Freq: Once | INTRAVENOUS | Status: AC
  Administered 2011-09-19: 1500 [IU] via INTRAVENOUS
  Filled 2011-09-19: qty 1500

## 2011-09-19 MED ORDER — INSULIN GLARGINE 100 UNIT/ML ~~LOC~~ SOLN
3.0000 [IU] | Freq: Every day | SUBCUTANEOUS | Status: DC
  Administered 2011-09-19: 3 [IU] via SUBCUTANEOUS
  Filled 2011-09-19: qty 3

## 2011-09-19 NOTE — Consult Note (Addendum)
[facility] Consult  Reson for Consult: Referring Physician:  History of Present Illness: This is a 70 y.o. male with long-standing diabetes and coronary artery disease. He underwent bypass surgery in 2000. Since then he has done well from a cardiac perspective. Until a few days ago he remained active able to walk and get around with minimal difficulty. Approximately 2 weeks ago he noted drainage and an ulcer on his left foot. This has subsequently worsened. He's had ongoing drainage despite debridement from his foot. 2 days ago the patient had a shaking fever and chill which ultimately got better. Today he had a similar episode. The patient has complained others about chest discomfort but denies these symptoms when I ask him about his symptoms. He further denies shortness of breath, nausea, vomiting, or diaphoresis. He denies syncope. In the emergency room, he was noted to have an elevated troponin and ruled in for an NSTEMI.  He will undergo cardiac catheterization tomorrow.  Vascular surgery consulted for non-healing wound left foot.  He states that his wound started as a blister, but has progressively gotten worse.  It started ~ one month ago.  He is ambulatory.   Past Medical History  Diagnosis Date  . Blood transfusion   . Angina   . Chronic kidney disease   . Anemia   . Diabetes mellitus   . Peripheral vascular disease   . Hypertension   . Coronary artery disease   . Myocardial infarction   . Shortness of breath   . Gout     left foot   Past Surgical History  Procedure Date  . 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 Knee arthroplasty     2000  . Cataract extraction, bilateral   . Eye surgery   . Cardiac catheterization     Allergies  Allergen Reactions  . Altace     Current Facility-Administered Medications  Medication Dose Route Frequency Provider Last Rate Last Dose  . 0.9 %  sodium chloride infusion  1,000 mL Intravenous STAT Hurman Horn, MD       . 0.9 %  sodium chloride infusion   Intravenous Continuous Jai Samtani 125 mL/hr at 09/19/11 1400    . acetaminophen (TYLENOL) tablet 650 mg  650 mg Oral Q4H PRN Pleas Koch      . ALPRAZolam Prudy Feeler) tablet 0.25 mg  0.25 mg Oral BID PRN Pleas Koch      . aspirin 81 MG chewable tablet        324 mg at 09/18/11 1455  . aspirin chewable tablet 324 mg  324 mg Oral NOW Pleas Koch      . aspirin tablet 325 mg  325 mg Oral Daily Hurman Horn, MD   325 mg at 09/19/11 1032  . clopidogrel (PLAVIX) tablet 75 mg  75 mg Oral Q breakfast Jai Samtani   75 mg at 09/19/11 0804  . dextrose 50 % solution 25 mL  25 mL Intravenous PRN Pleas Koch      . heparin 100 units/mL bolus via infusion 3,000 Units  3,000 Units Intravenous Once Severiano Gilbert, PHARMD   3,000 Units at 09/19/11 1200  . heparin ADULT infusion 100 units/mL (25000 units/250 mL)  1,550 Units/hr Intravenous Continuous Severiano Gilbert, PHARMD 15.5 mL/hr at 09/19/11 1400 15.5 mL/hr at 09/19/11 1400  . hydrALAZINE (APRESOLINE) injection 10 mg  10 mg Intravenous Once Emily B West, PA      . insulin aspart (novoLOG)  injection 0-9 Units  0-9 Units Subcutaneous Q4H Jai Samtani   3 Units at 09/19/11 1229  . insulin glargine (LANTUS) injection 3 Units  3 Units Subcutaneous QHS Jai Samtani      . metoprolol (LOPRESSOR) injection 5 mg  5 mg Intravenous NOW Michael E. Hall   5 mg at 09/19/11 0359  . metoprolol tartrate (LOPRESSOR) tablet 12.5 mg  12.5 mg Oral BID Pleas Koch      . nitroGLYCERIN (NITROSTAT) SL tablet 0.4 mg  0.4 mg Sublingual Q5 min PRN Pleas Koch      . ondansetron (ZOFRAN) injection 4 mg  4 mg Intravenous Q6H PRN Pleas Koch      . piperacillin-tazobactam (ZOSYN) IVPB 3.375 g  3.375 g Intravenous Once Hurman Horn, MD      . piperacillin-tazobactam (ZOSYN) IVPB 3.375 g  3.375 g Intravenous [address] [place], PHARMD   3.375 g at 09/19/11 0603  . rosuvastatin (CRESTOR) tablet 10 mg  10 mg Oral Daily Jai Samtani   10 mg at  09/19/11 1106  . sodium chloride 0.9 % bolus 2,000 mL  2,000 mL Intravenous Once Hurman Horn, MD   2,000 mL at 09/18/11 1709  . sodium chloride 0.9 % bolus 500 mL  500 mL Intravenous Once Hurman Horn, MD   500 mL at 09/18/11 1445  . vancomycin (VANCOCIN) 1,500 mg in sodium chloride 0.9 % 500 mL IVPB  1,500 mg Intravenous Q24H Christian M [place], PHARMD   1,500 mg at 09/18/11 2222  . DISCONTD: 0.9 %  sodium chloride infusion  1,000 mL Intravenous STAT Hurman Horn, MD      . DISCONTD: 0.9 %  sodium chloride infusion  250 mL Intravenous Continuous Pleas Koch      . DISCONTD: aspirin 81 MG chewable tablet           . DISCONTD: aspirin suppository 300 mg  300 mg Rectal NOW Pleas Koch      . DISCONTD: dextrose 5 %-0.45 % sodium chloride infusion   Intravenous Continuous Jai Samtani 125 mL/hr at 09/19/11 0303    . DISCONTD: heparin 100 units/mL bolus via infusion 4,000 Units  4,000 Units Intravenous Once Christian M Buettner, PHARMD      . DISCONTD: heparin ADULT infusion 100 units/mL (25000 units/250 mL)  14 Units/kg/hr Intravenous Continuous Christian M Buettner, PHARMD      . DISCONTD: insulin aspart (novoLOG) injection 10 Units  10 Units Subcutaneous Once Christian M Buettner, PHARMD      . DISCONTD: insulin aspart (novoLOG) injection 12 Units  12 Units Subcutaneous Once Hurman Horn, MD      . DISCONTD: insulin regular (HUMULIN R,NOVOLIN R) 1 Units/mL in sodium chloride 0.9 % 100 mL infusion   Intravenous Continuous Jai Samtani 4.4 mL/hr at 09/19/11 0802 4.4 Units/hr at 09/19/11 0802  . DISCONTD: insulin regular (HUMULIN R,NOVOLIN R) 100 units/mL injection 10 Units  10 Units Subcutaneous Once Rise Patience, [place]      . DISCONTD: metoprolol (LOPRESSOR) injection 5 mg  5 mg Intravenous Q6H Jai Samtani   5 mg at 09/19/11 0007  . DISCONTD: metoprolol tartrate (LOPRESSOR) tablet 25 mg  25 mg Oral Q6H Michael E. Hall   25 mg at 09/19/11 0405  . DISCONTD: sodium chloride 0.9 % injection 3 mL  3 mL  Intravenous Q12H Jai Samtani      . DISCONTD: sodium chloride 0.9 % injection 3 mL  3 mL Intravenous PRN Pleas Koch      .  DISCONTD: vancomycin (VANCOCIN) 1,500 mg in sodium chloride 0.9 % 500 mL IVPB  1,500 mg Intravenous Q24H Christian M Buettner, PHARMD      . DISCONTD: vancomycin (VANCOCIN) IVPB 1000 mg/200 mL premix  1,000 mg Intravenous Once Hurman Horn, MD        History   Social History  . Marital Status: Married    Spouse Name: N/A    Number of Children: N/A  . Years of Education: N/A   Occupational History  . Not on file.   Social History Main Topics  . Smoking status: Never Smoker   . Smokeless tobacco: Not on file  . Alcohol Use: No  . Drug Use: No  . Sexually Active: No   Other Topics Concern  . Not on file   Social History Narrative  . No narrative on file    History reviewed. No pertinent family history.   ROS: [x]  Positive   [ ]  Negative   [ ]  All sytems reviewed and are negative  General: [ ]  Weight loss, [ ]  Fever, [ ]  chills Neurologic: [ ]  Dizziness, [ ]  Blackouts, [ ]  Seizure [ ]  Stroke, [ ]  "Mini stroke", [ ]  Slurred speech, [ ]  Temporary blindness; [ ]  weakness in arms or legs, [ ]  Hoarseness Cardiac: [x]  Chest pain/pressure, [ ]  Shortness of breath at rest [x]  Shortness of breath with exertion, [ ]  Atrial fibrillation or irregular heartbeat Vascular: [ ]  Pain in legs with walking, [ ]  Pain in legs at rest, [ ]  Pain in legs at night,  [x]  Non-healing ulcer left foot, [ ]  Blood clot in vein/DVT,   Pulmonary: [ ]  Home oxygen, [ ]  Productive cough, [ ]  Coughing up blood, [ ]  Asthma,  [ ]  Wheezing Musculoskeletal:  [ ]  Arthritis, [ ]  Low back pain, [ ]  Joint pain [x] gout Hematologic: [ ]  Easy Bruising, [ ]  Anemia; [ ]  Hepatitis Gastrointestinal: [ ]  Blood in stool, [ ]  Gastroesophageal Reflux/heartburn, [ ]  Trouble swallowing Urinary: [ ]  chronic Kidney disease, [ ]  on HD - [ ]  MWF or [ ]  TTHS, [ ]  Burning with urination, [ ]  Difficulty  urinating Skin: [ ]  Rashes, [x]  Wounds left foot Psychological: [ ]  Anxiety, [ ]  Depression   Physical Examination  Filed Vitals:   09/19/11 1300  BP: 113/56  Pulse: 96  Temp:   Resp: 20    Body mass index is 31.28 kg/(m^2).  General:  WDWN in NAD Gait: Normal HENT: WNL Eyes: Pupils equal Pulmonary: normal non-labored breathing , without Rales, rhonchi,  wheezing Cardiac: RRR, without  Murmurs, rubs or gallops;+ right carotid bruit, no bruit heard on the left, Abdomen: soft, NT, no masses Skin: no rashes, + ulcer left plantar surface of foot w/necrotic tissue. Vascular Exam/Pulses:2+ radial pulses bilaterally; there are +doppler signals in the PT/DP bilaterally with the right > left.    Extremities:  There is an ulcer with necrotic tissue on the plantar aspect of his left foot.  The left foot is erythematous.  There is no edema in the BLE. Well healed incision from radial artery harvest on his left forearm.  He has a well healed incision on his RLE from vein harvest.  He has a well healed incision on the anterior portion of his left leg from previous fracture and surgery.  Musculoskeletal: no muscle wasting or atrophy  Neurologic: A&O X 3; Appropriate Affect ; SENSATION: normal; MOTOR FUNCTION:  moving all extremities equally. Speech is fluent/normal  Non-Invasive  Vascular Imaging: none at this time  BMET    Component Value Date/Time   NA 136 09/19/2011 0500   K 4.2 09/19/2011 0500   CL 108 09/19/2011 0500   CO2 17* 09/19/2011 0500   GLUCOSE 164* 09/19/2011 0500   BUN 40* 09/19/2011 0500   CREATININE 1.74* 09/19/2011 0500   CALCIUM 8.6 09/19/2011 0500   GFRNONAA 37* 09/19/2011 0500   GFRAA 43* 09/19/2011 0500    CBC    Component Value Date/Time   WBC 8.1 09/19/2011 0500   RBC 3.71* 09/19/2011 0500   HGB 11.0* 09/19/2011 0500   HCT 32.4* 09/19/2011 0500   PLT 133* 09/19/2011 0500   MCV 87.3 09/19/2011 0500   MCH 29.6 09/19/2011 0500   MCHC 34.0 09/19/2011 0500   RDW 13.7  09/19/2011 0500   LYMPHSABS 0.4* 09/18/2011 1420   MONOABS 0.6 09/18/2011 1420   EOSABS 0.0 09/18/2011 1420   BASOSABS 0.0 09/18/2011 1420      ASSESSMENT/PLAN: This is a 70 y.o. male who presented to the hospital and ruled in with a NSTEMI and subsequently also has a non-healing wound to his left foot.   Pt will undergo cardiac cath tomorrow.  After discussing with Dr. Darrick Penna, since the pt's creatinine is 1.74, we will let him recover from his cardiac cath and perform his arteriogram at a later date to give his kidneys time to recover. Dr. Darrick Penna to discuss with Dr. Rosalie Gums.   He does have a right carotid bruit and will need a carotid duplex scan on this admission to evaluate carotid artery disease.  He is asymptomatic and denies symptoms of slurred speech, amaurosis fugax, hemiparalysis.    Newton Pigg, PA-C [facility] [phone]

## 2011-09-19 NOTE — Consult Note (Signed)
History details as per Samantha's note above.  ABI reviewed most likely non compressible calcified vessels with popliteal tibial disease.  He has had right leg vein harvest for CABG.  Prior CEA right side by Dr Liliane Bade.  On exam pt has 1+ Femoral pulses bilaterally with absent popliteal and pedal pulses Left for has exposed metatarsal head  With gangrenous skin changes and foul smell Foot is diffusely erythematous.  Pt discussed with Dr. Rosalie Gums. Cardiac status needs to be main priority.  If possible he will also do diagnostic lower extremity arteriogram but with renal insufficiency this may need to be staged.  Discussed with pt that he has a limb threatening situation and that he will need at least metatarsal amputation of his first toe but this will not heal without revascularization. If this cannot be done percutaneously he will need to wait at least a few weeks prior to considering a bypass in light of his recent MI.  If foot continues to deteriorate during this period he may require BKA vs AKA as we would need to do procedure with least myocardial risk.  Will continue to follow.    Dry dressing left foot  Carotid Duplex for bruit

## 2011-09-19 NOTE — Progress Notes (Signed)
Subjective: Cough lastr night, no chest pain at all.  Has been out of bed this am-felt unsteady when he first got up this am as well.  Had stool and urination.  No CP right now at all. No n/v/no fever.  And concerned about his care and whether he had a heart issue.   Nursing reports that blood sugars are now under control and questions whether he can discontinue that. Patient still n.p.o.  Objective: Weight change:   Intake/Output Summary (Last 24 hours) at 09/19/11 9604 Last data filed at 09/19/11 0700  Gross per 24 hour  Intake 2147.04 ml  Output    200 ml  Net 1947.04 ml    Filed Vitals:   09/19/11 0800  BP: 94/55  Pulse: 92  Temp: 98.1 F (36.7 C)  Resp: 23    HEENT arcus senilis no pallor dentition good JVD marginal but slightly elevated at 30 no green noted keratectomy scar on the right side.  CHEST no tactile vocal fremitus resonance or added sound. Lung bases clear posteriorly CARDS S1-S2 no murmur rub or gallop ABD soft nontender nondistended no rebound or guarding bowel sounds heard \ NEURO intact grossly cranial nerves II through XII SKIN skin red and erythematous on the left leg wound is dressed with a dressing at present time not visualized this morning as yet. Foot is warm.  Lab Results: Lab Results  Component Value Date   WBC 8.1 09/19/2011   HGB 11.0* 09/19/2011   HCT 32.4* 09/19/2011   MCV 87.3 09/19/2011   PLT 133* 09/19/2011   Lab Results  Component Value Date   CREATININE 1.74* 09/19/2011   BUN 40* 09/19/2011   NA 136 09/19/2011   K 4.2 09/19/2011   CL 108 09/19/2011   CO2 17* 09/19/2011    Micro Results: Recent Results (from the past 240 hour(s))  MRSA PCR SCREENING     Status: Normal   Collection Time   09/18/11  8:59 PM      Component Value Range Status Comment   MRSA by PCR NEGATIVE  NEGATIVE  Final     Studies/Results: Dg Chest 2 View  09/18/2011  *RADIOLOGY REPORT*  Clinical Data: Diabetic left foot ulcerations/abscess.  Prior CABG.  CHEST  - 2 VIEW 09/18/2011:  Comparison: None.  Findings: Prior sternotomy for CABG.  Cardiac silhouette mildly enlarged.  Thoracic aorta tortuous and atherosclerotic.  Hilar and mediastinal contours otherwise unremarkable.  Suboptimal inspiration.  Pulmonary venous hypertension without overt edema. Lungs clear.  No pleural effusions.  Degenerative changes and DISH involving the thoracic spine.  IMPRESSION: Suboptimal inspiration.  Mild cardiomegaly.  Pulmonary venous hypertension without overt edema.  No acute cardiopulmonary disease.  Original Report Authenticated By: Arnell Sieving, M.D.   Dg Foot Complete Left  09/18/2011  *RADIOLOGY REPORT*  Clinical Data: Ulcer at plantar aspect left foot with abscess  LEFT FOOT - COMPLETE 3+ VIEW  Comparison: None  Findings: Marked osseous demineralization. Scattered small vessel atherosclerotic calcifications. Joint spaces preserved. Soft tissue gas at great toe. Assessment of cortical integrity is suboptimal due to degree of bony demineralization. No definite acute fracture, dislocation or bone destruction identified. It is difficult to exclude destruction at the lateral aspect of the first metatarsal head due to degree of demineralization.  IMPRESSION: Soft tissue gas at great toe consistent with infection. No definite acute bony abnormalities identified, though the head of the first metatarsal is difficult to assess. If osteomyelitis is clinically suspected, or assessment of extent of soft  tissue infection is required, recommend MR imaging with and without contrast.  Original Report Authenticated By: Lollie Marrow, M.D.   Medications: Scheduled Meds:   . sodium chloride  1,000 mL Intravenous STAT  . aspirin      . aspirin  324 mg Oral NOW   Or  . aspirin  300 mg Rectal NOW  . aspirin  325 mg Oral Daily  . clopidogrel  75 mg Oral Q breakfast  . hydrALAZINE  10 mg Intravenous Once  . insulin aspart  10 Units Subcutaneous Once  . metoprolol  5 mg Intravenous  NOW  . metoprolol tartrate  25 mg Oral Q6H  . piperacillin-tazobactam  3.375 g Intravenous Once  . piperacillin-tazobactam (ZOSYN)  IV  3.375 g Intravenous Q8H  . rosuvastatin  10 mg Oral Daily  . sodium chloride  2,000 mL Intravenous Once  . sodium chloride  500 mL Intravenous Once  . vancomycin  1,500 mg Intravenous Q24H  . DISCONTD: sodium chloride  1,000 mL Intravenous STAT  . DISCONTD: aspirin      . DISCONTD: heparin  4,000 Units Intravenous Once  . DISCONTD: insulin aspart  12 Units Subcutaneous Once  . DISCONTD: insulin regular  10 Units Subcutaneous Once  . DISCONTD: metoprolol  5 mg Intravenous Q6H  . DISCONTD: sodium chloride  3 mL Intravenous Q12H  . DISCONTD: vancomycin  1,500 mg Intravenous Q24H  . DISCONTD: vancomycin  1,000 mg Intravenous Once   Continuous Infusions:   . sodium chloride 125 mL/hr at 09/18/11 2221  . dextrose 5 % and 0.45% NaCl 125 mL/hr at 09/19/11 0303  . heparin 1,250 Units/hr (09/19/11 0003)  . insulin (NOVOLIN-R) infusion 4.4 Units/hr (09/19/11 0802)  . DISCONTD: sodium chloride    . DISCONTD: heparin     PRN Meds:.acetaminophen, ALPRAZolam, dextrose, nitroGLYCERIN, ondansetron (ZOFRAN) IV, DISCONTD: sodium chloride  Assessment/Plan: Patient Active Hospital Problem List: Non-STEMI (non-ST elevated myocardial infarction) (09/18/2011)   Assessment: Patient is deathly had what looks like an end STEMI sort of event which appears to have it normalized per EKG done this morning does show ST segment depression and flipped T waves consistent with yesterday's especially in V4 V5 and V6. It appears that this likely was secondary to the chest pain that the patient had on Sunday. I discussed this patient extensively with Dr. Ladona Ridgel yesterday on telephone with regards to further management. Dr. has a short me that the cardiologist will be by specializes in vascular procedures as well to determine whether patient would meet criteria for coronary  angiographic.  This remains to be determined as patient does have a creatinine today of 1.74.  His troponins is still peaking and the troponin is 5.8 with MB fraction of 10.5.     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 Otherwise I will consult vascular surgery later this evening with regards to his care. Patient will likely benefit from some type of operative procedure in terms of a stent placed to increase blood flow. Alternatively patient may sustain an below knee amputation at this time it is unclear to me at this time further management for now.  We'll defer perioperative clearance to cardiology     DM (diabetes mellitus) type I uncontrolled, periph vascular disorder (09/18/2011)   Assessment: Early  controlled blood sugars have been consistently within the 148-166 range as of this morning we will discontinue the continue the sliding scale insulin and keep him n.p.o. and cover him with Lantus and of very sensitive resistant sliding scale. He certainly would benefit from outpatient management of his diabetes states once all of these issues are resolved and may be able to go back on oral medication but for now given the fact that he is in the ICU and more poorly because he has a severe infection he will need insulin    Diverticulosis of colon (09/18/2011)   Assessment: As is a stable problem at present time     S/P carotid endarterectomy (09/18/2011)   Assessment: Stable    Iron deficiency anemia (09/18/2011)   Assessment: Stable we'll type and cross given the fact that patient might need some amount of surgery done     Gout attack (09/18/2011)   Assessment: Discontinued the majority of his gout medications including allopurinol and colchicine as well as  indomethacin these may have had an effect on his kidney issues due to this    HTN (hypertension) (09/18/2011)   Assessment: Patient actually slightly hypotensive and we will half his dose of metoprolol present time. Patient does definitely need criteria for perioperative beta blocker-the question is does patient need to be on an ACE at present time this patient need to be on nitroglycerin without chest pain and hypotension.  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 Risks and benefits of any procedure need to be determined with regards to this as well and I will review the patient subsequently keep him on IV fluid repletion with one (non-case any Angiographic studies done and review.  40 minutes critical care time inclusive of 15 minutes face-to-face direct time and discussion with his wife Dorcas   LOS: 1 day   Klynn Linnemann,JAI 09/19/2011, 8:22 AM

## 2011-09-19 NOTE — Progress Notes (Addendum)
ANTICOAGULATION CONSULT NOTE - Follow Up Consult  Pharmacy Consult for Heparin Indication: chest pain/ACS  Allergies  Allergen Reactions  . Altace     Patient Measurements: Height: 5\' 10"  (177.8 cm) Weight: 218 lb 0.6 oz (98.9 kg) IBW/kg (Calculated) : 73    Vital Signs: Temp: 98.1 F (36.7 C) (11/06 0800) Temp src: Axillary (11/06 0800) BP: 108/47 mmHg (11/06 1100) Pulse Rate: 101  (11/06 1100)  Labs:  Basename 09/19/11 0848 09/19/11 0500 09/18/11 2152 09/18/11 1420  HGB -- 11.0* -- 11.6*  HCT -- 32.4* -- 33.5*  PLT -- 133* -- 150  APTT -- -- -- --  LABPROT -- -- -- --  INR -- -- -- --  HEPARINUNFRC <0.10* -- -- --  CREATININE -- 1.74* -- 1.72*  CKTOTAL 180 173 171 --  CKMB 12.4* 10.5* 12.3* --  TROPONINI 8.48* 5.58* 4.44* --   Estimated Creatinine Clearance: 45.3 ml/min (by C-G formula based on Cr of 1.74).   Medications:  Scheduled:    . sodium chloride  1,000 mL Intravenous STAT  . aspirin      . aspirin  324 mg Oral NOW  . aspirin  325 mg Oral Daily  . clopidogrel  75 mg Oral Q breakfast  . heparin  3,000 Units Intravenous Once  . hydrALAZINE  10 mg Intravenous Once  . insulin aspart  0-9 Units Subcutaneous Q4H  . insulin glargine  3 Units Subcutaneous QHS  . metoprolol  5 mg Intravenous NOW  . metoprolol tartrate  12.5 mg Oral BID  . piperacillin-tazobactam  3.375 g Intravenous Once  . piperacillin-tazobactam (ZOSYN)  IV  3.375 g Intravenous Q8H  . rosuvastatin  10 mg Oral Daily  . sodium chloride  2,000 mL Intravenous Once  . sodium chloride  500 mL Intravenous Once  . vancomycin  1,500 mg Intravenous Q24H  . DISCONTD: sodium chloride  1,000 mL Intravenous STAT  . DISCONTD: aspirin      . DISCONTD: aspirin  300 mg Rectal NOW  . DISCONTD: heparin  4,000 Units Intravenous Once  . DISCONTD: insulin aspart  10 Units Subcutaneous Once  . DISCONTD: insulin aspart  12 Units Subcutaneous Once  . DISCONTD: insulin regular  10 Units Subcutaneous Once    . DISCONTD: metoprolol  5 mg Intravenous Q6H  . DISCONTD: metoprolol tartrate  25 mg Oral Q6H  . DISCONTD: sodium chloride  3 mL Intravenous Q12H  . DISCONTD: vancomycin  1,500 mg Intravenous Q24H  . DISCONTD: vancomycin  1,000 mg Intravenous Once   Infustions:    . sodium chloride 125 mL/hr at 09/19/11 1000  . heparin 1,250 Units/hr (09/19/11 0003)  . DISCONTD: sodium chloride    . DISCONTD: dextrose 5 % and 0.45% NaCl 125 mL/hr at 09/19/11 0303  . DISCONTD: heparin    . DISCONTD: insulin (NOVOLIN-R) infusion 4.4 Units/hr (09/19/11 0802)   Anti-infectives     Start     Dose/Rate Route Frequency Ordered Stop   09/19/11 1000   vancomycin (VANCOCIN) 1,500 mg in sodium chloride 0.9 % 500 mL IVPB  Status:  Discontinued        1,500 mg 250 mL/hr over 120 Minutes Intravenous Every 24 hours 09/18/11 1733 09/18/11 2315   09/18/11 1445   vancomycin (VANCOCIN) IVPB 1000 mg/200 mL premix  Status:  Discontinued        1,000 mg 200 mL/hr over 60 Minutes Intravenous  Once 09/18/11 1431 09/18/11 1945          Assessment:  70 yom who has ruled in for MI/nstemi also with new onset afib currently below goal on heparin drip, no bleeding complications noted. Planning on cath but not likely for today. Started on broad antibiotics last night with vancomycin and zosyn, will continue current doses. Goal of Therapy:  Heparin level 0.3-0.7 units/ml   Plan:  3000 units bolus Increase IV drip rate to 1550 units/hr 8hr heparin level Continue vancomycin 1500 iv q24 hours Continue zosyn 3.375grams iv q 8 hours  Severiano Gilbert 09/19/2011,11:57 AM

## 2011-09-19 NOTE — Progress Notes (Signed)
SUBJECTIVE: I was asked by Dr. Ladona Ridgel to see this patient today. Pt has no complaints. No awareness of irregularity of his heart rhythm. No CP, SOB. He converted to atrial fibrillation overnight around 2:30am.   BP 94/55  Pulse 92  Temp(Src) 98.1 F (36.7 C) (Axillary)  Resp 23  Ht 5\' 10"  (1.778 m)  Wt 218 lb 0.6 oz (98.9 kg)  BMI 31.28 kg/m2  SpO2 97%  Intake/Output Summary (Last 24 hours) at 09/19/11 0941 Last data filed at 09/19/11 0700  Gross per 24 hour  Intake 2147.04 ml  Output    200 ml  Net 1947.04 ml    PHYSICAL EXAM General: Well developed, well nourished, in no acute distress. Alert and oriented x 3.  Psych:  Good affect, responds appropriately Neck: No JVD. No masses noted.  Lungs: Clear bilaterally with no wheezes or rhonci noted.  Heart: Irregular without murmurs noted. Abdomen: Bowel sounds are present. Soft, non-tender.  Extremities: No lower extremity edema. Plantar surface of left foot with 4x4 cm necrotic ulcer. No surrounding erythema.   LABS: Basic Metabolic Panel:  Basename 09/19/11 0500 09/18/11 1420  NA 136 134*  K 4.2 5.0  CL 108 102  CO2 17* 17*  GLUCOSE 164* 475*  BUN 40* 44*  CREATININE 1.74* 1.72*  CALCIUM 8.6 9.7  MG -- --  PHOS -- --   CBC:  Basename 09/19/11 0500 09/18/11 1420  WBC 8.1 8.0  NEUTROABS -- 7.0  HGB 11.0* 11.6*  HCT 32.4* 33.5*  MCV 87.3 87.9  PLT 133* 150   Cardiac Enzymes:  Basename 09/19/11 0500 09/18/11 2152 09/18/11 1901  CKTOTAL 173 171 185  CKMB 10.5* 12.3* 12.3*  CKMBINDEX -- -- --  TROPONINI 5.58* 4.44* 4.47*   Current Meds:   . sodium chloride  1,000 mL Intravenous STAT              . aspirin  325 mg Oral Daily  . clopidogrel  75 mg Oral Q breakfast  . hydrALAZINE  10 mg Intravenous Once  . insulin aspart  0-9 Units Subcutaneous Q4H  . insulin glargine  3 Units Subcutaneous QHS  . metoprolol  5 mg Intravenous NOW  . metoprolol tartrate  12.5 mg Oral BID  . piperacillin-tazobactam  3.375 g  Intravenous Once  . piperacillin-tazobactam (ZOSYN)  IV  3.375 g Intravenous Q8H  . rosuvastatin  10 mg Oral Daily  . sodium chloride  2,000 mL Intravenous Once  . sodium chloride  500 mL Intravenous Once  . vancomycin  1,500 mg Intravenous Q24H    ASSESSMENT AND PLAN:  Principal Problem: 1. CAD: Admitted with non-STEMI. He has no active chest pain. His troponin is greater than 5.0. He is now on heparin gtt, ASA and Plavix. 4V CABG 2000 per Dr. Cornelius Moras. Clearly this is an acute coronary syndrome, however, he also has renal insufficiency (? Baseline creatinine-no creatinine before yesterday is documented). He will definetely need a left heart cath with grafts. Timing is important with his renal insufficiency. He is also going to need a left lower extremity angiogram at the time of cath which will increase his contrast load.   ---Will Hydrate aggressively today. Echo to assess wall motion, LVEF today. Plan NPO at midnight for left heart cath and selective left lower extremity angiogram in the am.   2. 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 I have discussed this with Dr. Mahala Menghini. A selective lower extremity angiogram can be performed at the  time of the cardiac cath. Vascular surgery has been consulted today for a surgical opinion. Once again, we will need to work closely together with his active cardiac process and vascular process. If he needs a coronary stent, any open surgical procedure would need to be delayed while he is on Plavix. Hopefully, we can approach his lower extremity disease and cardiac disease percutaneously.  Further plans after his cath and angiogram. Continue antibiotics per primary team for wound.     3. New onset atrial fibrillation: No prior documented history of atrial fibrillation. He is on a heparin gtt currently. Continue beta blocker for rate control. Titrate beta blocker as necessary for rate control.    Todd Cook,Todd Cook  11/6/20129:41  AM

## 2011-09-19 NOTE — Progress Notes (Deleted)
hey

## 2011-09-19 NOTE — Progress Notes (Signed)
ANTICOAGULATION CONSULT NOTE - Follow Up Consult  Pharmacy Consult for Unfractionated Heparin IV Indication: chest pain/ACS  Allergies  Allergen Reactions  . Altace     Patient Measurements: Height: 5\' 10"  (177.8 cm) Weight: 218 lb 0.6 oz (98.9 kg) IBW/kg (Calculated) : 73  Hep Wt 93.5kg  Vital Signs: Temp: 98.1 F (36.7 C) (11/06 1638) Temp src: Oral (11/06 1100) BP: 122/60 mmHg (11/06 1600) Pulse Rate: 103  (11/06 1600)  Labs:  Basename 09/19/11 1938 09/19/11 0848 09/19/11 0500 09/18/11 2152 09/18/11 1420  HGB 10.5* -- 11.0* -- --  HCT 31.1* -- 32.4* -- 33.5*  PLT 135* -- 133* -- 150  APTT -- -- -- -- --  LABPROT 16.0* -- -- -- --  INR 1.25 -- -- -- --  HEPARINUNFRC 0.21* <0.10* -- -- --  CREATININE -- -- 1.74* -- 1.72*  CKTOTAL -- 180 173 171 --  CKMB -- 12.4* 10.5* 12.3* --  TROPONINI -- 8.48* 5.58* 4.44* --   Estimated Creatinine Clearance: 45.3 ml/min (by C-G formula based on Cr of 1.74).   Medications:  Prescriptions prior to admission  Medication Sig Dispense Refill  . allopurinol (ZYLOPRIM) 300 MG tablet Take 300 mg by mouth daily.        Marland Kitchen aspirin EC 81 MG tablet Take 81 mg by mouth daily.        . colchicine 0.6 MG tablet Take 0.6 mg by mouth daily.        Marland Kitchen glimepiride (AMARYL) 4 MG tablet Take 4 mg by mouth 2 (two) times daily.        Marland Kitchen losartan (COZAAR) 100 MG tablet Take 100 mg by mouth daily.        . simvastatin (ZOCOR) 20 MG tablet Take 20 mg by mouth at bedtime.        . sitaGLIPtin (JANUVIA) 100 MG tablet Take 100 mg by mouth daily.          Assessment: 70yo M currently on IV heparin at 1550units/hr for chest pain/ACS. Heparin level is subtherapeutic for goal. Plans for cath in am. No bleeding complications per RN.  Goal of Therapy:  Heparin level 0.3-0.7 units/ml   Plan:  Heparin bolus of 1500 units IV x1, then increase heparin to 1750 units/hr. Will recheck heparin level in 8hrs-ok to do with am labs or will follow-up post cath.    Fayne Norrie 09/19/2011,9:10 PM

## 2011-09-19 NOTE — Progress Notes (Signed)
Subjective:  No fevers or chills today. The patient denies chest pain or shortness of breath. Objective:  Vital Signs in the last 24 hours: Temp:  [97.8 F (36.6 C)-100.2 F (37.9 C)] 98.1 F (36.7 C) (11/06 0800) Pulse Rate:  [80-118] 98  (11/06 1000) Resp:  [19-29] 21  (11/06 1000) BP: (94-203)/(48-77) 106/55 mmHg (11/06 1000) SpO2:  [92 %-99 %] 96 % (11/06 1000) Weight:  [198 lb (89.812 kg)-218 lb 0.6 oz (98.9 kg)] 218 lb 0.6 oz (98.9 kg) (11/06 0500)  Intake/Output from previous day: 11/05 0701 - 11/06 0700 In: 2147 [I.V.:1584.5; IV Piggyback:562.5] Out: 200 [Urine:200] Intake/Output from this shift: Total I/O In: 450 [I.V.:412.5; IV Piggyback:37.5] Out: 225 [Urine:225]  Physical Exam: Chronically ill appearing NAD HEENT: Unremarkable Neck:  No JVD, no thyromegally Lungs:  Clear with basilar rales, no wheezes. HEART:  Regular rate rhythm, no murmurs. A soft S4 is present. Abd:  soft, positive bowel sounds, no organomegally, no rebound, no guarding Ext:   Left foot is bandaged. No erythema. Both legs are warm. Skin:  No rashes no nodules Neuro:  CN II through XII intact, motor grossly intact  Lab Results:  Basename 09/19/11 0500 09/18/11 1420  WBC 8.1 8.0  HGB 11.0* 11.6*  PLT 133* 150    Basename 09/19/11 0500 09/18/11 1420  NA 136 134*  K 4.2 5.0  CL 108 102  CO2 17* 17*  GLUCOSE 164* 475*  BUN 40* 44*  CREATININE 1.74* 1.72*    Basename 09/19/11 0848 09/19/11 0500  TROPONINI 8.48* 5.58*   Hepatic Function Panel  Basename 09/18/11 1420  PROT 6.2  ALBUMIN 3.0*  AST 27  ALT 21  ALKPHOS 92  BILITOT 0.5  BILIDIR --  IBILI --   Chest x-ray - reviewed Cardiac Studies: Telemetry demonstrates atrial fibrillation.  Assessment/Plan:  Principal Problem:  *Non-STEMI (non-ST elevated myocardial infarction) - the patient appears to have ruled in for an MI. He has had no symptoms to speak of. He will be continued on intravenous heparin and his current  medical therapy. I discussed the case with Dr.McAlhaney and he will likely proceed with catheterization. Active Problems:   Osteomyelitis - the patient has very poor circulation in his left lower leg. He has peripheral vascular disease. He will likely need an angiogram of his left lower extremities. Based on these findings, we would consider vascular surgery consult versus possible percutaneous intervention.    LOS: 1 day    Todd Cook 09/19/2011, 11:09 AM    

## 2011-09-19 NOTE — Progress Notes (Signed)
Inpatient Diabetes Program Recommendations  AACE/ADA: New Consensus Statement on Inpatient Glycemic Control (2009)  Target Ranges:  Prepandial:   less than 140 mg/dL      Peak postprandial:   less than 180 mg/dL (1-2 hours)      Critically ill patients:  140 - 180 mg/dL   Reason for Visit: Hyperglycemia  Inpatient Diabetes Program Recommendations HgbA1C: order to assess pre-hospital glucose control  Note: last insulin gtt rate 4.4 u/hr

## 2011-09-20 ENCOUNTER — Other Ambulatory Visit: Payer: Self-pay

## 2011-09-20 ENCOUNTER — Encounter (HOSPITAL_COMMUNITY)
Admission: EM | Disposition: A | Discharge status: Rehabilitation Facility | Payer: Self-pay | Source: Home / Self Care | Attending: Internal Medicine

## 2011-09-20 ENCOUNTER — Inpatient Hospital Stay (HOSPITAL_COMMUNITY): Payer: Medicare PPO

## 2011-09-20 DIAGNOSIS — I255 Ischemic cardiomyopathy: Secondary | ICD-10-CM

## 2011-09-20 DIAGNOSIS — I25810 Atherosclerosis of coronary artery bypass graft(s) without angina pectoris: Secondary | ICD-10-CM

## 2011-09-20 DIAGNOSIS — I059 Rheumatic mitral valve disease, unspecified: Secondary | ICD-10-CM

## 2011-09-20 DIAGNOSIS — R0989 Other specified symptoms and signs involving the circulatory and respiratory systems: Secondary | ICD-10-CM

## 2011-09-20 HISTORY — DX: Ischemic cardiomyopathy: I25.5

## 2011-09-20 HISTORY — PX: PERCUTANEOUS CORONARY STENT INTERVENTION (PCI-S): SHX5485

## 2011-09-20 HISTORY — PX: LEFT HEART CATHETERIZATION WITH CORONARY/GRAFT ANGIOGRAM: SHX5450

## 2011-09-20 LAB — CBC
HCT: 32.4 % — ABNORMAL LOW (ref 39.0–52.0)
Hemoglobin: 11.1 g/dL — ABNORMAL LOW (ref 13.0–17.0)
MCH: 29.8 pg (ref 26.0–34.0)
MCHC: 34.3 g/dL (ref 30.0–36.0)
MCV: 87.1 fL (ref 78.0–100.0)
RDW: 14.1 % (ref 11.5–15.5)

## 2011-09-20 LAB — URINE CULTURE
Colony Count: NO GROWTH
Culture  Setup Time: 201211060818
Culture: NO GROWTH

## 2011-09-20 LAB — GLUCOSE, CAPILLARY
Glucose-Capillary: 241 mg/dL — ABNORMAL HIGH (ref 70–99)
Glucose-Capillary: 184 mg/dL — ABNORMAL HIGH (ref 70–99)
Glucose-Capillary: 267 mg/dL — ABNORMAL HIGH (ref 70–99)

## 2011-09-20 LAB — BASIC METABOLIC PANEL
CO2: 16 mEq/L — ABNORMAL LOW (ref 19–32)
Chloride: 106 mEq/L (ref 96–112)
GFR calc non Af Amer: 36 mL/min — ABNORMAL LOW (ref >90)
Glucose, Bld: 284 mg/dL — ABNORMAL HIGH (ref 70–99)
Potassium: 4.5 mEq/L (ref 3.5–5.1)
Sodium: 134 mEq/L — ABNORMAL LOW (ref 135–145)

## 2011-09-20 SURGERY — PERCUTANEOUS CORONARY STENT INTERVENTION (PCI-S)
Anesthesia: LOCAL

## 2011-09-20 MED ORDER — MIDAZOLAM HCL 2 MG/2ML IJ SOLN
INTRAMUSCULAR | Status: AC
  Filled 2011-09-20: qty 2

## 2011-09-20 MED ORDER — INSULIN ASPART 100 UNIT/ML ~~LOC~~ SOLN
0.0000 [IU] | Freq: Three times a day (TID) | SUBCUTANEOUS | Status: DC
  Administered 2011-09-21 (×2): 4 [IU] via SUBCUTANEOUS
  Administered 2011-09-21: 11 [IU] via SUBCUTANEOUS
  Administered 2011-09-22 (×2): 4 [IU] via SUBCUTANEOUS
  Administered 2011-09-22: 7 [IU] via SUBCUTANEOUS
  Administered 2011-09-23 – 2011-09-24 (×3): 4 [IU] via SUBCUTANEOUS
  Administered 2011-09-24 – 2011-09-25 (×2): 3 [IU] via SUBCUTANEOUS
  Administered 2011-09-26: 4 [IU] via SUBCUTANEOUS
  Administered 2011-09-27: 20 [IU] via SUBCUTANEOUS
  Administered 2011-09-27: 14 [IU] via SUBCUTANEOUS
  Administered 2011-09-27: 15 [IU] via SUBCUTANEOUS
  Administered 2011-09-28: 7 [IU] via SUBCUTANEOUS
  Administered 2011-09-28 – 2011-09-29 (×3): 4 [IU] via SUBCUTANEOUS
  Administered 2011-09-29: 11 [IU] via SUBCUTANEOUS
  Filled 2011-09-20: qty 3

## 2011-09-20 MED ORDER — INSULIN GLARGINE 100 UNIT/ML ~~LOC~~ SOLN
14.0000 [IU] | Freq: Every day | SUBCUTANEOUS | Status: DC
  Administered 2011-09-20 – 2011-09-24 (×5): 14 [IU] via SUBCUTANEOUS
  Filled 2011-09-20: qty 3

## 2011-09-20 MED ORDER — LIVING WELL WITH DIABETES BOOK
Freq: Once | Status: DC
  Filled 2011-09-20: qty 1

## 2011-09-20 MED ORDER — FENTANYL CITRATE 0.05 MG/ML IJ SOLN
INTRAMUSCULAR | Status: AC
  Filled 2011-09-20: qty 2

## 2011-09-20 MED ORDER — HEPARIN (PORCINE) IN NACL 2-0.9 UNIT/ML-% IJ SOLN
INTRAMUSCULAR | Status: AC
  Filled 2011-09-20: qty 1000

## 2011-09-20 MED ORDER — NITROGLYCERIN 0.2 MG/ML ON CALL CATH LAB
INTRAVENOUS | Status: AC
  Filled 2011-09-20: qty 1

## 2011-09-20 MED ORDER — INSULIN ASPART 100 UNIT/ML ~~LOC~~ SOLN
0.0000 [IU] | Freq: Every day | SUBCUTANEOUS | Status: DC
  Administered 2011-09-20: 3 [IU] via SUBCUTANEOUS
  Administered 2011-09-21 – 2011-09-23 (×2): 2 [IU] via SUBCUTANEOUS
  Administered 2011-09-27: 4 [IU] via SUBCUTANEOUS

## 2011-09-20 MED ORDER — CLOPIDOGREL BISULFATE 300 MG PO TABS
ORAL_TABLET | ORAL | Status: AC
  Filled 2011-09-20: qty 1

## 2011-09-20 MED ORDER — ONDANSETRON HCL 4 MG/2ML IJ SOLN
INTRAMUSCULAR | Status: AC
  Filled 2011-09-20: qty 2

## 2011-09-20 MED ORDER — FUROSEMIDE 10 MG/ML IJ SOLN
INTRAMUSCULAR | Status: AC
  Administered 2011-09-20: 40 mg via INTRAVENOUS
  Filled 2011-09-20: qty 4

## 2011-09-20 MED ORDER — LIDOCAINE HCL (PF) 1 % IJ SOLN
INTRAMUSCULAR | Status: AC
  Filled 2011-09-20: qty 30

## 2011-09-20 MED ORDER — INSULIN ASPART 100 UNIT/ML ~~LOC~~ SOLN
3.0000 [IU] | Freq: Three times a day (TID) | SUBCUTANEOUS | Status: DC
  Administered 2011-09-21 – 2011-09-25 (×11): 3 [IU] via SUBCUTANEOUS

## 2011-09-20 MED ORDER — FUROSEMIDE 10 MG/ML IJ SOLN
40.0000 mg | Freq: Once | INTRAMUSCULAR | Status: AC
  Administered 2011-09-20: 40 mg via INTRAVENOUS

## 2011-09-20 MED ORDER — SODIUM CHLORIDE 0.9 % IV SOLN: INTRAVENOUS | Status: DC

## 2011-09-20 NOTE — ED Provider Notes (Signed)
Medical screening examination/treatment/procedure(s) were conducted as a shared visit with non-physician practitioner(s) and myself.  I personally evaluated the patient during the encounter  Hurman Horn, MD 09/20/11 2206

## 2011-09-20 NOTE — Progress Notes (Signed)
Subjective: The patient is presently in his room recovering post cardiac catheter. He presently has no complaints. He denies chest pain fevers chills nausea vomiting or abdominal pain. I have reviewed his full history and data base.   Objective: Weight change: 13.188 kg (29 lb 1.2 oz)  Intake/Output Summary (Last 24 hours) at 09/20/11 1327 Last data filed at 09/20/11 1000  Gross per 24 hour  Intake   3508 ml  Output   2900 ml  Net    608 ml   Blood pressure 107/83, pulse 87, temperature 97.9 F (36.6 C), temperature source Oral, resp. rate 24, height 5\' 10"  (1.778 m), weight 103 kg (227 lb 1.2 oz), SpO2 97.00%. Temp:  [97.7 F (36.5 C)-98.8 F (37.1 C)] 97.9 F (36.6 C) (11/07 0836) Pulse Rate:  [87-104] 87  (11/07 0600) Resp:  [22-30] 24  (11/07 0600) BP: (107-184)/(60-83) 107/83 mmHg (11/07 0600) SpO2:  [90 %-97 %] 97 % (11/07 0600) Weight:  [103 kg (227 lb 1.2 oz)] 227 lb 1.2 oz (103 kg) (11/07 0500)  Physical Exam: General: No acute respiratory distress Lungs: Clear to auscultation bilaterally without wheezes or crackles Cardiovascular: Regular rate and rhythm without murmur gallop or rub normal S1 and S2 Abdomen: Nontender, nondistended, soft, bowel sounds positive, no rebound, no ascites, no appreciable mass Extremities: No significant cyanosis, clubbing, or edema bilateral lower extremities  Lab Results:  Basename 09/20/11 0720 09/19/11 0500 09/18/11 1420  NA 134* 136 134*  K 4.5 4.2 5.0  CL 106 108 102  CO2 16* 17* 17*  GLUCOSE 284* 164* 475*  BUN 47* 40* 44*  CREATININE 1.79* 1.74* 1.72*  CALCIUM 8.6 8.6 9.7  MG -- -- --  PHOS -- -- --    Basename 09/18/11 1420  AST 27  ALT 21  ALKPHOS 92  BILITOT 0.5  PROT 6.2  ALBUMIN 3.0*   No results found for this basename: LIPASE:2,AMYLASE:2 in the last 72 hours  Basename 09/20/11 0720 09/19/11 1938 09/19/11 0500 09/18/11 1420  WBC 11.3* 7.8 8.1 --  NEUTROABS -- -- -- 7.0  HGB 11.1* 10.5* 11.0* --  HCT  32.4* 31.1* 32.4* --  MCV 87.1 87.9 87.3 --  PLT 140* 135* 133* --    Basename 09/19/11 0848 09/19/11 0500 09/18/11 2152  CKTOTAL 180 173 171  CKMB 12.4* 10.5* 12.3*  CKMBINDEX -- -- --  TROPONINI 8.48* 5.58* 4.44*   No results found for this basename: POCBNP:3 in the last 72 hours No results found for this basename: DDIMER:2 in the last 72 hours No results found for this basename: HGBA1C:12 in the last 72 hours No results found for this basename: CHOL:2,HDL:2,LDLCALC:2,TRIG:2,CHOLHDL:2,LDLDIRECT:2 in the last 72 hours No results found for this basename: TSH,T4TOTAL,FREET3,T3FREE,THYROIDAB in the last 72 hours No results found for this basename: VITAMINB12:2,FOLATE:2,FERRITIN:2,TIBC:2,IRON:2,RETICCTPCT:2 in the last 72 hours  Micro Results: Recent Results (from the past 240 hour(s))  CULTURE, BLOOD (ROUTINE X 2)     Status: Normal (Preliminary result)   Collection Time   09/18/11  2:38 PM      Component Value Range Status Comment   Specimen Description BLOOD ARM LEFT   Final    Special Requests BOTTLES DRAWN AEROBIC ONLY 10CC   Final    Setup Time [id number]   Final    Culture     Final    Value: GRAM POSITIVE COCCI IN PAIRS AND CHAINS     Note: Gram Stain Report Called to,Read Back By and Verified With: CHRISTIAN TOOENTINO 09/19/11 @  1822 HAJAM   Report Status PENDING   Incomplete   CULTURE, BLOOD (ROUTINE SINGLE)     Status: Normal (Preliminary result)   Collection Time   09/18/11  4:07 PM      Component Value Range Status Comment   Specimen Description BLOOD ARM RIGHT   Final    Special Requests BOTTLES DRAWN AEROBIC AND ANAEROBIC 10CC EACH   Final    Setup Time [id number]   Final    Culture     Final    Value:        BLOOD CULTURE RECEIVED NO GROWTH TO DATE CULTURE WILL BE HELD FOR 5 DAYS BEFORE ISSUING A FINAL NEGATIVE REPORT   Report Status PENDING   Incomplete   MRSA PCR SCREENING     Status: Normal   Collection Time   09/18/11  8:59 PM      Component Value Range  Status Comment   MRSA by PCR NEGATIVE  NEGATIVE  Final     Studies/Results: Scheduled Meds:   . sodium chloride  1,000 mL Intravenous STAT  . aspirin  324 mg Oral NOW  . aspirin  325 mg Oral Daily  . clopidogrel  75 mg Oral Q breakfast  . furosemide  40 mg Intravenous Once  . heparin  1,500 Units Intravenous Once  . hydrALAZINE  10 mg Intravenous Once  . insulin aspart  0-9 Units Subcutaneous Q4H  . insulin glargine  3 Units Subcutaneous QHS  . living well with diabetes book   Does not apply Once  . metoprolol tartrate  12.5 mg Oral BID  . piperacillin-tazobactam  3.375 g Intravenous Once  . piperacillin-tazobactam (ZOSYN)  IV  3.375 g Intravenous Q8H  . rosuvastatin  10 mg Oral Daily  . sodium chloride  3 mL Intravenous Q12H  . vancomycin  1,500 mg Intravenous Q24H   Continuous Infusions:   . sodium chloride 125 mL/hr at 09/19/11 2100  . sodium chloride 1 mL/kg/hr (09/20/11 0355)  . heparin 17.5 Units/kg/hr (09/20/11 1000)  . DISCONTD: sodium chloride     PRN Meds:.acetaminophen, ALPRAZolam, dextrose, nitroGLYCERIN, ondansetron (ZOFRAN) IV, sodium chloride  Assessment/Plan:  Non-STEMI (non-ST elevated myocardial infarction) - S/P CABG x 4  The patient was taken to cardiac cath lab by cardiology today. He underwent PTCA and stent placement with a drug-eluting stent to one of his grafted vessels. Cardiology continues to follow.  Osteomyelitis L foot  Vascular surgery is following. Empiric antibiotics continue. Arteriogram was not performed during cardiac catheter today out of concern for the patient's renal function.  1 out of 2 blood cultures positive - gram-positive cocci in pairs and chains The patient is being covered with vancomycin empirically. This may very well be a simple contaminant. We will need to followup on the speciation and consider discontinuing vancomycin based on the results.  Cardiomyopathy, ischemic This appears to be a new diagnosis. At the present  time the patient appears to be volume compensated.  DM (diabetes mellitus) type I uncontrolled, periph vascular disorder We will continue strict sliding-scale insulin and adjust treatment to improve CBG control.  Diverticulosis of colon Quiescent at the present time  S/P carotid endarterectomy/peripheral vascular disease Please see discussion above under osteomyelitis  Iron deficiency anemia This is likely due to renal insufficiency as well as smoldering osteomyelitis. We will follow the hemoglobin trend.  Gout attack Appears to be much improved at the present time  HTN (hypertension) His blood pressure has been erratic thus far-we will follow and  adjust meds as needed      LOS: 2 days   Therasa Lorenzi T 09/20/2011, 1:27 PM

## 2011-09-20 NOTE — Procedures (Signed)
Cardiac Catheterization Procedure Note  Name: Todd Cook MRN: [id number] DOB: Aug 15, 1941  Procedure: Left Heart Cath, Selective Coronary Angiography, SVG/LIMA angiography,  PTCA/Stent of SVG to RCA with a DES.   Indication:    Diagnostic Procedure Details: The right groin was prepped, draped, and anesthetized with 1% lidocaine. Using the modified Seldinger technique, a 5 French sheath was introduced into the right femoral artery. Standard Judkins catheters were used for selective coronary angiography. Catheter exchanges were performed over a wire.  The diagnostic procedure was well-tolerated without immediate complications.  PROCEDURAL FINDINGS  Contrast used: 110 ml.  Hemodynamics:   AO 157/73/108 mm Hg   LV 153/13. EDP : 23 mm Hg  Coronary angiography   Coronary dominance: right    Left mainstem: 20 % stenosis.     Left anterior descending (LAD): 70% proximal disease and occluded in mid segment. Supplied by a patent LIMA.     Left circumflex (LCx): Occluded proximally.     Right coronary artery (RCA): Occluded in mid segment.                           LIMA to LAD: patent with 60% ostial stenosis.                            SVG to RCA: 80% proximal stenosis and 40% distal stenosis.                           SVG to OM2: patent with mild 30% proximal disease.                           SVG to OM3 : patent with mild 20% proximal disease.   Left ventriculography: Not performed. EF is 35% by echo.   PCI Procedure Note:  Following the diagnostic procedure, the decision was made to proceed with PCI. The sheath was upsized to a 6 [place]. Weight-based bivalirudin was given for anticoagulation. Once a therapeutic ACT was achieved, a 6 [place] AR1 guide catheter was inserted. I tried different guides before that that included JR4, AL1 and LCB but did not work. The graft had a tortious take off.   A [place] coronary guidewire was used to cross the lesion.  A 4.0 Spider distal  protection device was used. The lesion was predilated with a 2.5 X12 mm balloon.  The lesion was then stented with a 2.75 X 16 mm Promus drug eluting stent. There was 0% residual stenosis and TIMI-3 flow. Final angiography confirmed an excellent result.   The patient tolerated the PCI procedure well. There were no immediate procedural complications.  The patient was transferred to the post catheterization recovery area for further monitoring.  PCI Data:  Vessel - SVG to RCA  /Segment - proximal.   Percent Stenosis (pre)  80%  TIMI-flow 3  Stent Promus DES. 2.75 X 16 mm  Percent Stenosis (post) 0  TIMI-flow (post) 3    Final Conclusions:  Significant 3 vessel CAD. LV gram not performed.  Patent grafts but with significant disease in proximal SVG to RCA. Moderate disease in ostial LIMA.  Successful PCI and DES placement to SVG to RCA with a distal protection device.   Recommendations: Aggressive medical therapy. Continue dual antiplatelet therapy for at least 12 months. Consider LE angiography in near future. Monitor renal function.  Lorine Bears 09/20/2011, 3:34 PM

## 2011-09-20 NOTE — Progress Notes (Signed)
  Echocardiogram 2D Echocardiogram has been performed.  Todd Cook 09/20/2011, 10:09 AM

## 2011-09-20 NOTE — Progress Notes (Signed)
ANTICOAGULATION CONSULT NOTE - Follow Up Consult  Pharmacy Consult for Heparin Indication: chest pain/ACS  Allergies  Allergen Reactions  . Altace     Patient Measurements: Height: 5\' 10"  (177.8 cm) Weight: 227 lb 1.2 oz (103 kg) IBW/kg (Calculated) : 73  Adjusted Body Weight:   Vital Signs: Temp: 97.9 F (36.6 C) (11/07 0836) Temp src: Oral (11/07 0836) BP: 107/83 mmHg (11/07 0600) Pulse Rate: 87  (11/07 0600)  Labs:  Basename 09/20/11 0720 09/19/11 1938 09/19/11 0848 09/19/11 0500 09/18/11 2152 09/18/11 1420  HGB 11.1* 10.5* -- -- -- --  HCT 32.4* 31.1* -- 32.4* -- --  PLT 140* 135* -- 133* -- --  APTT -- -- -- -- -- --  LABPROT -- 16.0* -- -- -- --  INR -- 1.25 -- -- -- --  HEPARINUNFRC 0.24* 0.21* <0.10* -- -- --  CREATININE 1.79* -- -- 1.74* -- 1.72*  CKTOTAL -- -- 180 173 171 --  CKMB -- -- 12.4* 10.5* 12.3* --  TROPONINI -- -- 8.48* 5.58* 4.44* --   Estimated Creatinine Clearance: 44.8 ml/min (by C-G formula based on Cr of 1.79).   Medications:  Infustions:    . sodium chloride 125 mL/hr at 09/19/11 2100  . sodium chloride 1 mL/kg/hr (09/20/11 0355)  . heparin 1,750 Units/hr (09/20/11 0351)  . DISCONTD: sodium chloride      Assessment: 70 yom ruled in for MI currently just below goal on heparin drip. No bleeding complications noted, Will increase drip rate slightly then follow up after cath Goal of Therapy:  Heparin level 0.3-0.7 units/ml   Plan:  infusion rate 1900 units/hr F/u after cath  Todd Cook 09/20/2011,9:21 AM

## 2011-09-20 NOTE — Progress Notes (Signed)
*  PRELIMINARY RESULTS*  Carotid Dopplers has been performed. Preliminary - No significant ICA stenosis on the right. There is a 60 to 79% stenosis by velocity of the left ICA. Vertebral arteries are patent with antegrade flow.    Mila Homer 09/20/2011, 2:05 PM

## 2011-09-20 NOTE — H&P (View-Only) (Signed)
Subjective:  No fevers or chills today. The patient denies chest pain or shortness of breath. Objective:  Vital Signs in the last 24 hours: Temp:  [97.8 F (36.6 C)-100.2 F (37.9 C)] 98.1 F (36.7 C) (11/06 0800) Pulse Rate:  [80-118] 98  (11/06 1000) Resp:  [19-29] 21  (11/06 1000) BP: (94-203)/(48-77) 106/55 mmHg (11/06 1000) SpO2:  [92 %-99 %] 96 % (11/06 1000) Weight:  [198 lb (89.812 kg)-218 lb 0.6 oz (98.9 kg)] 218 lb 0.6 oz (98.9 kg) (11/06 0500)  Intake/Output from previous day: 11/05 0701 - 11/06 0700 In: 2147 [I.V.:1584.5; IV Piggyback:562.5] Out: 200 [Urine:200] Intake/Output from this shift: Total I/O In: 450 [I.V.:412.5; IV Piggyback:37.5] Out: 225 [Urine:225]  Physical Exam: Chronically ill appearing NAD HEENT: Unremarkable Neck:  No JVD, no thyromegally Lungs:  Clear with basilar rales, no wheezes. HEART:  Regular rate rhythm, no murmurs. A soft S4 is present. Abd:  soft, positive bowel sounds, no organomegally, no rebound, no guarding Ext:   Left foot is bandaged. No erythema. Both legs are warm. Skin:  No rashes no nodules Neuro:  CN II through XII intact, motor grossly intact  Lab Results:  Basename 09/19/11 0500 09/18/11 1420  WBC 8.1 8.0  HGB 11.0* 11.6*  PLT 133* 150    Basename 09/19/11 0500 09/18/11 1420  NA 136 134*  K 4.2 5.0  CL 108 102  CO2 17* 17*  GLUCOSE 164* 475*  BUN 40* 44*  CREATININE 1.74* 1.72*    Basename 09/19/11 0848 09/19/11 0500  TROPONINI 8.48* 5.58*   Hepatic Function Panel  Basename 09/18/11 1420  PROT 6.2  ALBUMIN 3.0*  AST 27  ALT 21  ALKPHOS 92  BILITOT 0.5  BILIDIR --  IBILI --   Chest x-ray - reviewed Cardiac Studies: Telemetry demonstrates atrial fibrillation.  Assessment/Plan:  Principal Problem:  *Non-STEMI (non-ST elevated myocardial infarction) - the patient appears to have ruled in for an MI. He has had no symptoms to speak of. He will be continued on intravenous heparin and his current  medical therapy. I discussed the case with Dr.McAlhaney and he will likely proceed with catheterization. Active Problems:   Osteomyelitis - the patient has very poor circulation in his left lower leg. He has peripheral vascular disease. He will likely need an angiogram of his left lower extremities. Based on these findings, we would consider vascular surgery consult versus possible percutaneous intervention.    LOS: 1 day    Todd Cook 09/19/2011, 11:09 AM

## 2011-09-20 NOTE — Interval H&P Note (Signed)
History and Physical Interval Note:   09/20/2011   1:34 PM   Todd Cook  has presented today for surgery, with the diagnosis of Chest Pain  The various methods of treatment have been discussed with the patient and family. After consideration of risks, benefits and other options for treatment, the patient has consented to  Procedure(s): LEFT HEART CATHETERIZATION WITH CORONARY/GRAFT ANGIOGRAM LOWER EXTREMITY ANGIOGRAM as a surgical intervention .  The patients' history has been reviewed, patient examined, no change in status, stable for surgery.  I have reviewed the patients' chart and labs.  Questions were answered to the patient's satisfaction.     Lorine Bears  MD       Date of Initial H&P: 09/18/2011  History reviewed, patient examined, no change in status, stable for surgery.

## 2011-09-20 NOTE — Progress Notes (Signed)
Inpatient Diabetes Program Recommendations  AACE/ADA: New Consensus Statement on Inpatient Glycemic Control (2009)  Target Ranges:  Prepandial:   less than 140 mg/dL      Peak postprandial:   less than 180 mg/dL (1-2 hours)      Critically ill patients:  140 - 180 mg/dL   Reason for Visit: Hyperglycemia-CBGs 256,269,253  Inpatient Diabetes Program Recommendations Insulin - Basal: Increase Lantus to 20 units  HgbA1C: order to assess pre-hospital glucose control  Thank you

## 2011-09-20 NOTE — Progress Notes (Signed)
Order for sheath removal verified per post procedural orders.  Patient's family was asked to exit the room during sheath removal. Procedure explained to patient and Rt femoral artery access site assessed: level 0, palpable dorsalis pedis and posterior tibial pulses.  90F Sheath removed and manual pressure applied for 20 minutes.  Pre, peri, & post procedural vitals: HR 80's - 90's, RR 20, O2 Sat upper 90's, BP 130's-150's/70's-90's, Pain 0.  Distal pulses remained intact after sheath removal.  Access site level 0 and dressed with 4X4 gauze and tegaderm.  Jenness Corner, RN confirmed condition of site.  Post procedural instructions discussed with return demonstration from patient.

## 2011-09-20 NOTE — Progress Notes (Signed)
Patient having developed difficulty breathing, increased respirations to 30's. Oxygen via nasal cannula at 4L in order to keep sats in 90's, blood pressure increased as well. Physician on call notified, orders for Lasix 40mg  IV and chest x-ray obtained. Will monitor for changes. Montine Circle, RN

## 2011-09-20 NOTE — Progress Notes (Signed)
  Pt currently undergoing cardiac cath need several cardiac interventions.  Will need to recover from dye load prior to lower extremity procedures.  Will reassess in a few days.  Fabienne Bruns, MD [facility] Office: [phone] Pager: [phone]

## 2011-09-21 LAB — GLUCOSE, CAPILLARY
Glucose-Capillary: 154 mg/dL — ABNORMAL HIGH (ref 70–99)
Glucose-Capillary: 218 mg/dL — ABNORMAL HIGH (ref 70–99)

## 2011-09-21 LAB — BASIC METABOLIC PANEL
BUN: 36 mg/dL — ABNORMAL HIGH (ref 6–23)
CO2: 20 mEq/L (ref 19–32)
Chloride: 104 mEq/L (ref 96–112)
Creatinine, Ser: 1.69 mg/dL — ABNORMAL HIGH (ref 0.50–1.35)
Glucose, Bld: 166 mg/dL — ABNORMAL HIGH (ref 70–99)

## 2011-09-21 LAB — CBC
HCT: 35.6 % — ABNORMAL LOW (ref 39.0–52.0)
MCV: 88.6 fL (ref 78.0–100.0)
RBC: 4.02 MIL/uL — ABNORMAL LOW (ref 4.22–5.81)
WBC: 11.4 10*3/uL — ABNORMAL HIGH (ref 4.0–10.5)

## 2011-09-21 LAB — CULTURE, BLOOD (ROUTINE X 2)

## 2011-09-21 MED ORDER — SODIUM CHLORIDE 0.9 % IV SOLN
INTRAVENOUS | Status: DC
  Administered 2011-09-23: 23:00:00 via INTRAVENOUS

## 2011-09-21 MED ORDER — GLIMEPIRIDE 4 MG PO TABS
4.0000 mg | ORAL_TABLET | Freq: Two times a day (BID) | ORAL | Status: DC
  Administered 2011-09-21 – 2011-09-25 (×9): 4 mg via ORAL
  Filled 2011-09-21 (×10): qty 1

## 2011-09-21 MED ORDER — ALLOPURINOL 300 MG PO TABS
300.0000 mg | ORAL_TABLET | Freq: Every day | ORAL | Status: DC
  Administered 2011-09-21 – 2011-09-29 (×9): 300 mg via ORAL
  Filled 2011-09-21 (×9): qty 1

## 2011-09-21 NOTE — Progress Notes (Signed)
Received order post PCI. Pt with moderately high Afib and osteomyelitis in toe. Please specify if/when ambulation is warranted.  Also, if pt is ready for ambulation, it seems pt would benefit from PT as well as CRPI. Please order if you agree.  Todd Cook CES, ACSM

## 2011-09-21 NOTE — Progress Notes (Signed)
Subjective: Appetite is poor. Able to walk to commode but gets short of breath and feels weak. Has a cough which is worse than it had been at home- no sputum. No fevers. No chest pain.   Objective: Weight change: -11.6 kg (-25 lb 9.2 oz)  Intake/Output Summary (Last 24 hours) at 09/21/11 1457 Last data filed at 09/21/11 1300  Gross per 24 hour  Intake   1400 ml  Output   4425 ml  Net  -3025 ml   Blood pressure 150/74, pulse 74, temperature 100 F (37.8 C), temperature source Oral, resp. rate 24, height 5\' 10"  (1.778 m), weight 91.4 kg (201 lb 8 oz), SpO2 96.00%. Temp:  [97.8 F (36.6 C)-100 F (37.8 C)] 100 F (37.8 C) (11/08 1205) Pulse Rate:  [74-110] 74  (11/08 0900) Resp:  [18-26] 24  (11/08 0900) BP: (114-164)/(43-81) 150/74 mmHg (11/08 0745) SpO2:  [92 %-96 %] 96 % (11/08 1205) Weight:  [91.4 kg (201 lb 8 oz)] 201 lb 8 oz (91.4 kg) (11/08 0500)  Physical Exam: General: No acute respiratory distress Lungs: Clear to auscultation bilaterally without wheezes or crackles Cardiovascular: Regular rate and rhythm without murmur gallop or rub normal S1 and S2 Abdomen: Nontender, nondistended, soft, bowel sounds positive, no rebound, no ascites, no appreciable mass Extremities: No significant cyanosis, clubbing, or edema bilateral lower extremities Skin: Large ulcer on plantar aspect of forefoot (under 1st metatarsal head) w/ brownish discharge on dressing.   Lab Results:  Basename 09/21/11 0615 09/20/11 0720 09/19/11 0500  NA 137 134* 136  K 3.7 4.5 4.2  CL 104 106 108  CO2 20 16* 17*  GLUCOSE 166* 284* 164*  BUN 36* 47* 40*  CREATININE 1.69* 1.79* 1.74*  CALCIUM 9.3 8.6 8.6  MG -- -- --  PHOS -- -- --   No results found for this basename: AST:2,ALT:2,ALKPHOS:2,BILITOT:2,PROT:2,ALBUMIN:2 in the last 72 hours No results found for this basename: LIPASE:2,AMYLASE:2 in the last 72 hours  Basename 09/21/11 0615 09/20/11 0720 09/19/11 1938  WBC 11.4* 11.3* 7.8  NEUTROABS --  -- --  HGB 11.8* 11.1* 10.5*  HCT 35.6* 32.4* 31.1*  MCV 88.6 87.1 87.9  PLT 160 140* 135*    Basename 09/19/11 0848 09/19/11 0500 09/18/11 2152  CKTOTAL 180 173 171  CKMB 12.4* 10.5* 12.3*  CKMBINDEX -- -- --  TROPONINI 8.48* 5.58* 4.44*   No results found for this basename: POCBNP:3 in the last 72 hours No results found for this basename: DDIMER:2 in the last 72 hours No results found for this basename: HGBA1C:12 in the last 72 hours No results found for this basename: CHOL:2,HDL:2,LDLCALC:2,TRIG:2,CHOLHDL:2,LDLDIRECT:2 in the last 72 hours No results found for this basename: TSH,T4TOTAL,FREET3,T3FREE,THYROIDAB in the last 72 hours No results found for this basename: VITAMINB12:2,FOLATE:2,FERRITIN:2,TIBC:2,IRON:2,RETICCTPCT:2 in the last 72 hours  Micro Results: Recent Results (from the past 240 hour(s))  CULTURE, BLOOD (ROUTINE X 2)     Status: Normal   Collection Time   09/18/11  2:38 PM      Component Value Range Status Comment   Specimen Description BLOOD ARM LEFT   Final    Special Requests BOTTLES DRAWN AEROBIC ONLY 10CC   Final    Setup Time [id number]   Final    Culture     Final    Value: VIRIDANS STREPTOCOCCUS     Note: Gram Stain Report Called to,Read Back By and Verified With: CHRISTIAN TOOENTINO 09/19/11 @ 1822 HAJAM   Report Status 09/21/2011 FINAL   Final  CULTURE, BLOOD (ROUTINE SINGLE)     Status: Normal (Preliminary result)   Collection Time   09/18/11  4:07 PM      Component Value Range Status Comment   Specimen Description BLOOD ARM RIGHT   Final    Special Requests BOTTLES DRAWN AEROBIC AND ANAEROBIC 10CC EACH   Final    Setup Time [id number]   Final    Culture     Final    Value:        BLOOD CULTURE RECEIVED NO GROWTH TO DATE CULTURE WILL BE HELD FOR 5 DAYS BEFORE ISSUING A FINAL NEGATIVE REPORT   Report Status PENDING   Incomplete   MRSA PCR SCREENING     Status: Normal   Collection Time   09/18/11  8:59 PM      Component Value Range Status  Comment   MRSA by PCR NEGATIVE  NEGATIVE  Final   URINE CULTURE     Status: Normal   Collection Time   09/19/11  8:18 AM      Component Value Range Status Comment   Specimen Description URINE, CLEAN CATCH   Final    Special Requests NONE   Final    Setup Time [id number]   Final    Colony Count NO GROWTH   Final    Culture NO GROWTH   Final    Report Status 09/20/2011 FINAL   Final     Studies/Results: Scheduled Meds:    . aspirin  325 mg Oral Daily  . clopidogrel      . clopidogrel  75 mg Oral Q breakfast  . fentaNYL      . hydrALAZINE  10 mg Intravenous Once  . insulin aspart  0-20 Units Subcutaneous TID WC  . insulin aspart  0-5 Units Subcutaneous QHS  . insulin aspart  3 Units Subcutaneous TID WC  . insulin glargine  14 Units Subcutaneous QHS  . living well with diabetes book   Does not apply Once  . metoprolol tartrate  12.5 mg Oral BID  . midazolam      . nitroGLYCERIN      . ondansetron      . piperacillin-tazobactam  3.375 g Intravenous Once  . piperacillin-tazobactam (ZOSYN)  IV  3.375 g Intravenous Q8H  . rosuvastatin  10 mg Oral Daily  . sodium chloride  3 mL Intravenous Q12H  . vancomycin  1,500 mg Intravenous Q24H  . DISCONTD: heparin      . DISCONTD: insulin aspart  0-9 Units Subcutaneous Q4H  . DISCONTD: insulin glargine  3 Units Subcutaneous QHS   Continuous Infusions:    . sodium chloride 10 mL/hr at 09/21/11 1100  . DISCONTD: sodium chloride 125 mL/hr at 09/19/11 2100  . DISCONTD: sodium chloride 1 mL/kg/hr (09/20/11 0355)  . DISCONTD: sodium chloride    . DISCONTD: heparin 1,900 Units/hr (09/20/11 1400)   PRN Meds:.acetaminophen, ALPRAZolam, dextrose, nitroGLYCERIN, ondansetron (ZOFRAN) IV, sodium chloride  Assessment/Plan:  Non-STEMI (non-ST elevated myocardial infarction) - S/P CABG x 4  The patient was taken to cardiac cath lab,  underwent PTCA and stent placement with a drug-eluting stent to one of his grafted vessels. Cardiology continues  to follow.  A-fib w/ RVR- cardiology to manage. Not certain at this point if he should receive coumadin.   Osteomyelitis L foot  Vascular surgery is following. Empiric antibiotics continue. Arteriogram will be performed tomorrow.    1 out of 2 blood cultures positive - gram-positive cocci in pairs and chains  The patient is being covered with vancomycin empirically. Strep. Viridans on one set of blood cultures- not sure if we should treat this as a contaminant. Will d/w ID. We can d/c Vanc.   Cardiomyopathy, ischemic This appears to be a new diagnosis. At the present time the patient appears to be volume compensated.  DM (diabetes mellitus) type I uncontrolled, periph vascular disorder We will continue strict sliding-scale insulin and adjust treatment to improve CBG control.  Diverticulosis of colon Quiescent at the present time  S/P carotid endarterectomy/peripheral vascular disease Please see discussion above under osteomyelitis  Iron deficiency anemia This is likely due to renal insufficiency as well as smoldering osteomyelitis. We will follow the hemoglobin trend.  Gout attack Appears to be much improved at the present time  HTN (hypertension) His blood pressure has been erratic thus far-we will follow and adjust meds as needed      LOS: 3 days   [facility] ANWAR 09/21/2011, 2:57 PM

## 2011-09-21 NOTE — Progress Notes (Signed)
Subjective:  Denies c/p or sob. S/p PCI/Stent SVG.  Objective:  Vital Signs in the last 24 hours: Temp:  [97.6 F (36.4 C)-98.5 F (36.9 C)] 98.5 F (36.9 C) (11/08 0400) Pulse Rate:  [81-99] 90  (11/08 0400) Resp:  [23-25] 25  (11/08 0400) BP: (114-164)/(43-81) 133/56 mmHg (11/08 0400) SpO2:  [92 %-96 %] 94 % (11/08 0400) Weight:  [201 lb 8 oz (91.4 kg)] 201 lb 8 oz (91.4 kg) (11/08 0500)  Intake/Output from previous day: 11/07 0701 - 11/08 0700 In: 1280 [I.V.:705; IV Piggyback:575] Out: 4425 [Urine:4425] Intake/Output from this shift:    Physical Exam: Well appearing NAD HEENT: Unremarkable Neck:  No JVD, no thyromegally Lungs:  Clear with no wheezes, rales, or rhonchi HEART:  IRegular rate rhythm, no murmurs, no rubs, no clicks Abd:  soft, positive bowel sounds, no organomegally, no rebound, no guarding Ext:  Left foot with draining ulcer.  Skin:  No rashes no nodules Neuro:  CN II through XII intact, motor grossly intact  Lab Results:  [facility] 09/20/11 0720 09/19/11 1938  WBC 11.3* 7.8  HGB 11.1* 10.5*  PLT 140* 135*    Basename 09/20/11 0720 09/19/11 0500  NA 134* 136  K 4.5 4.2  CL 106 108  CO2 16* 17*  GLUCOSE 284* 164*  BUN 47* 40*  CREATININE 1.79* 1.74*    Basename 09/19/11 0848 09/19/11 0500  TROPONINI 8.48* 5.58*   Hepatic Function Panel  Basename 09/18/11 1420  PROT 6.2  ALBUMIN 3.0*  AST 27  ALT 21  ALKPHOS 92  BILITOT 0.5  BILIDIR --  IBILI --   No results found for this basename: CHOL in the last 72 hours No results found for this basename: PROTIME in the last 72 hours   Cardiac Studies: Catheterization results noted.  Assessment/Plan:  Principal Problem:  *Non-STEMI (non-ST elevated myocardial infarction) - he is s/p PCI stent SVG. No additional chest pain. Note plans for dual anti-platelet therapy. Active Problems: Atrial fibrillation he is in and out of atrial fib. His CHADS score is at least 2. I will discuss whether  there is enough incremental benefit to add coumadin in setting dual anti-platelet therapy. My guess is there is not. Peripheral vascular disease - he has a draining ulcer and severe peripheral vascular disease clinically. Will discuss timing of his peripheral angiography. DM - his CBG's are elevated. Will defer management to his primary team.  LOS: 3 days    Todd Cook 09/21/2011, 7:15 AM

## 2011-09-21 NOTE — Progress Notes (Signed)
ANTIBIOTIC CONSULT NOTE - FOLLOW UP  Pharmacy Consult for Vancomycin Indication: Bacteremia, osteomyelitis  Allergies  Allergen Reactions  . Altace     Patient Measurements: Height: 5\' 10"  (177.8 cm) Weight: 201 lb 8 oz (91.4 kg) IBW/kg (Calculated) : 73   Vital Signs: Temp: 98.8 F (37.1 C) (11/08 0744) Temp src: Oral (11/08 0744) BP: 133/56 mmHg (11/08 0400) Pulse Rate: 90  (11/08 0400) Intake/Output from previous day: 11/07 0701 - 11/08 0700 In: 1302.5 [I.V.:715; IV Piggyback:587.5] Out: 4425 [Urine:4425] Intake/Output from this shift:    Labs:  Basename 09/21/11 0615 09/20/11 0720 09/19/11 1938 09/19/11 0500 09/18/11 1420  WBC 11.4* 11.3* 7.8 -- --  HGB 11.8* 11.1* 10.5* -- --  PLT 160 140* 135* -- --  LABCREA -- -- -- -- --  CREATININE -- 1.79* -- 1.74* 1.72*   Estimated Creatinine Clearance: 42.4 ml/min (by C-G formula based on Cr of 1.79).   Microbiology: Recent Results (from the past 720 hour(s))  CULTURE, BLOOD (ROUTINE X 2)     Status: Normal (Preliminary result)   Collection Time   09/18/11  2:38 PM      Component Value Range Status Comment   Specimen Description BLOOD ARM LEFT   Final    Special Requests BOTTLES DRAWN AEROBIC ONLY 10CC   Final    Setup Time [id number]   Final    Culture     Final    Value: GRAM POSITIVE COCCI IN PAIRS AND CHAINS     Note: Gram Stain Report Called to,Read Back By and Verified With: CHRISTIAN TOOENTINO 09/19/11 @ 1822 HAJAM   Report Status PENDING   Incomplete   CULTURE, BLOOD (ROUTINE SINGLE)     Status: Normal (Preliminary result)   Collection Time   09/18/11  4:07 PM      Component Value Range Status Comment   Specimen Description BLOOD ARM RIGHT   Final    Special Requests BOTTLES DRAWN AEROBIC AND ANAEROBIC 10CC EACH   Final    Setup Time [id number]   Final    Culture     Final    Value:        BLOOD CULTURE RECEIVED NO GROWTH TO DATE CULTURE WILL BE HELD FOR 5 DAYS BEFORE ISSUING A FINAL NEGATIVE REPORT   Report Status PENDING   Incomplete   MRSA PCR SCREENING     Status: Normal   Collection Time   09/18/11  8:59 PM      Component Value Range Status Comment   MRSA by PCR NEGATIVE  NEGATIVE  Final   URINE CULTURE     Status: Normal   Collection Time   09/19/11  8:18 AM      Component Value Range Status Comment   Specimen Description URINE, CLEAN CATCH   Final    Special Requests NONE   Final    Setup Time [id number]   Final    Colony Count NO GROWTH   Final    Culture NO GROWTH   Final    Report Status 09/20/2011 FINAL   Final     Anti-infectives     Start     Dose/Rate Route Frequency Ordered Stop   09/19/11 1000   vancomycin (VANCOCIN) 1,500 mg in sodium chloride 0.9 % 500 mL IVPB  Status:  Discontinued        1,500 mg 250 mL/hr over 120 Minutes Intravenous Every 24 hours 09/18/11 1733 09/18/11 2315   09/18/11 1445   vancomycin (VANCOCIN) IVPB 1000  mg/200 mL premix  Status:  Discontinued        1,000 mg 200 mL/hr over 60 Minutes Intravenous  Once 09/18/11 1431 09/18/11 1945          Assessment: 70 year old man on Vancomycin and Zosyn for osteomyelitis and positive blood culture (speciation pending).  Goal of Therapy:  Vancomycin trough level 15-20 mcg/ml  Plan:  Will get a vancomycin trough on 11/9 if vancomycin to continue.  Mickeal Skinner 09/21/2011,8:26 AM

## 2011-09-21 NOTE — Progress Notes (Signed)
CBGs 11/07: 253/ 241/ 184/ 267 mg/dl.  Noted Lantus 14 units QHS started last pm.  Agree!  Fasting sugar today improved: 154 mg/dl.  Increased correction scale to Resistant scale today and also added 3 units Novolog meal coverage today.  Agree!  Will follow.

## 2011-09-22 ENCOUNTER — Encounter (HOSPITAL_COMMUNITY): Payer: Self-pay

## 2011-09-22 DIAGNOSIS — I4891 Unspecified atrial fibrillation: Secondary | ICD-10-CM

## 2011-09-22 LAB — GLUCOSE, CAPILLARY: Glucose-Capillary: 209 mg/dL — ABNORMAL HIGH (ref 70–99)

## 2011-09-22 MED ORDER — METOPROLOL TARTRATE 50 MG PO TABS
50.0000 mg | ORAL_TABLET | Freq: Two times a day (BID) | ORAL | Status: DC
  Administered 2011-09-23 – 2011-09-29 (×13): 50 mg via ORAL
  Filled 2011-09-22 (×14): qty 1

## 2011-09-22 MED ORDER — METOPROLOL TARTRATE 25 MG PO TABS
25.0000 mg | ORAL_TABLET | Freq: Two times a day (BID) | ORAL | Status: DC
  Administered 2011-09-22 (×2): 25 mg via ORAL
  Filled 2011-09-22 (×2): qty 1

## 2011-09-22 MED ORDER — LINAGLIPTIN 5 MG PO TABS
5.0000 mg | ORAL_TABLET | Freq: Every day | ORAL | Status: DC
  Administered 2011-09-22 – 2011-09-25 (×4): 5 mg via ORAL
  Filled 2011-09-22 (×4): qty 1

## 2011-09-22 MED ORDER — METOPROLOL TARTRATE 25 MG PO TABS
25.0000 mg | ORAL_TABLET | Freq: Once | ORAL | Status: AC
  Administered 2011-09-23: 25 mg via ORAL
  Filled 2011-09-22: qty 1

## 2011-09-22 NOTE — Progress Notes (Signed)
SUBJECTIVE: The patient is doing well today.  At this time, he denies chest pain, shortness of breath, or any new concerns.  OBJECTIVE: Physical Exam: Filed Vitals:   09/22/11 0800 09/22/11 0816 09/22/11 1212 09/22/11 1347  BP: 164/62   150/70  Pulse:    96  Temp:  98.6 F (37 C) 98.5 F (36.9 C)   TempSrc:  Oral Oral   Resp:      Height:      Weight:      SpO2: 98%  97%     Intake/Output Summary (Last 24 hours) at 09/22/11 1510 Last data filed at 09/22/11 1400  Gross per 24 hour  Intake    495 ml  Output   1900 ml  Net  -1405 ml   Telemetry reveals sinus rhythm presently, though he has had afib with V rates 120s  GEN- The patient is chronically ill appearing, alert and oriented x 3 today.   Head- normocephalic, atraumatic Eyes-  Sclera clear, conjunctiva pink Ears- hearing intact Oropharynx- clear Neck- supple, no JVP Lymph- no cervical lymphadenopathy Lungs- Clear to ausculation bilaterally, normal work of breathing Heart- Regular rate and rhythm,  GI- soft, NT, ND, + BS Extremities- no clubbing, cyanosis, trace edema  LABS: Basic Metabolic Panel:  Basename 09/21/11 0615 09/20/11 0720  NA 137 134*  K 3.7 4.5  CL 104 106  CO2 20 16*  GLUCOSE 166* 284*  BUN 36* 47*  CREATININE 1.69* 1.79*  CALCIUM 9.3 8.6  MG -- --  PHOS -- --   Liver Function Tests: No results found for this basename: AST:2,ALT:2,ALKPHOS:2,BILITOT:2,PROT:2,ALBUMIN:2 in the last 72 hours No results found for this basename: LIPASE:2,AMYLASE:2 in the last 72 hours CBC:  Basename 09/21/11 0615 09/20/11 0720  WBC 11.4* 11.3*  NEUTROABS -- --  HGB 11.8* 11.1*  HCT 35.6* 32.4*  MCV 88.6 87.1  PLT 160 140*   RADIOLOGY: Dg Chest 2 View  09/18/2011  *RADIOLOGY REPORT*  Clinical Data: Diabetic left foot ulcerations/abscess.  Prior CABG.  CHEST - 2 VIEW 09/18/2011:  Comparison: None.  Findings: Prior sternotomy for CABG.  Cardiac silhouette mildly enlarged.  Thoracic aorta tortuous and  atherosclerotic.  Hilar and mediastinal contours otherwise unremarkable.  Suboptimal inspiration.  Pulmonary venous hypertension without overt edema. Lungs clear.  No pleural effusions.  Degenerative changes and DISH involving the thoracic spine.  IMPRESSION: Suboptimal inspiration.  Mild cardiomegaly.  Pulmonary venous hypertension without overt edema.  No acute cardiopulmonary disease.  Original Report Authenticated By: Arnell Sieving, M.D.   Dg Chest Portable 1 View  09/20/2011  *RADIOLOGY REPORT*  Clinical Data: Shortness of breath.  PORTABLE CHEST - 1 VIEW  Comparison: None.  Findings: Cardiomegaly.  Central vascular congestion.  Interval development of right greater than left airspace opacities.  Status post median sternotomy and CABG.  No acute osseous abnormality.  No pneumothorax.  There may be small pleural effusions.  IMPRESSION: Interval development of right greater than left airspace opacities. The rapid onset favors a process such as asymmetric edema.  Original Report Authenticated By: Waneta Martins, M.D.   Dg Foot Complete Left  09/18/2011  *RADIOLOGY REPORT*  Clinical Data: Ulcer at plantar aspect left foot with abscess  LEFT FOOT - COMPLETE 3+ VIEW  Comparison: None  Findings: Marked osseous demineralization. Scattered small vessel atherosclerotic calcifications. Joint spaces preserved. Soft tissue gas at great toe. Assessment of cortical integrity is suboptimal due to degree of bony demineralization. No definite acute fracture, dislocation or bone destruction  identified. It is difficult to exclude destruction at the lateral aspect of the first metatarsal head due to degree of demineralization.  IMPRESSION: Soft tissue gas at great toe consistent with infection. No definite acute bony abnormalities identified, though the head of the first metatarsal is difficult to assess. If osteomyelitis is clinically suspected, or assessment of extent of soft tissue infection is required, recommend  MR imaging with and without contrast.  Original Report Authenticated By: Lollie Marrow, M.D.    ASSESSMENT AND PLAN:  1.  NSTEMI-  Stable s/p PCI without symptoms of ischemia           Continue ASA, Plavix, beta blocker, statin 2.  Afib- V rates elevated during afib   increase metoprolol to 50mg  BID              Would hold off on coumadin until decisions are made about peripheral vascular disease 3.  PVD-  Pt with L foot ulcer.  I have spoken with both Dr Darrick Penna and Dr Clifton Verlin Uher today.  The plan is for peripheral arteriogram on Monday by Dr Clifton Shronda Boeh.  Further treatment (angioplasty vs surgery) depends on findings. 4.  1:2 blood cultures positive for strep veridans-  Continue ABx per internal medicine 5.  Hypertension- above goal-  Increase metoprolol   Transfer to telemetry Out of bed/ PT over weekend.   Hillis Range, MD 09/22/2011 3:10 PM

## 2011-09-22 NOTE — Progress Notes (Signed)
Subjective: No new complaints. Awaiting Angio which might be done today.   Objective: Weight change: -2.1 kg (-4 lb 10.1 oz)  Intake/Output Summary (Last 24 hours) at 09/22/11 1058 Last data filed at 09/22/11 0817  Gross per 24 hour  Intake    680 ml  Output   3250 ml  Net  -2570 ml   Blood pressure 164/62, pulse 74, temperature 98.6 F (37 C), temperature source Oral, resp. rate 26, height 5\' 10"  (1.778 m), weight 89.3 kg (196 lb 13.9 oz), SpO2 98.00%. Temp:  [98 F (36.7 C)-100 F (37.8 C)] 98.6 F (37 C) (11/09 0816) Pulse Rate:  [53-138] 74  (11/09 0400) Resp:  [20-26] 26  (11/09 0400) BP: (97-164)/(43-67) 164/62 mmHg (11/09 0800) SpO2:  [95 %-98 %] 98 % (11/09 0800) Weight:  [89.3 kg (196 lb 13.9 oz)] 196 lb 13.9 oz (89.3 kg) (11/09 0400)  Physical Exam: General: No acute respiratory distress Lungs: Clear to auscultation bilaterally without wheezes or crackles Cardiovascular: Regular rate and rhythm without murmur gallop or rub normal S1 and S2 Abdomen: Nontender, nondistended, soft, bowel sounds positive, no rebound, no ascites, no appreciable mass Extremities: No significant cyanosis, clubbing, or edema bilateral lower extremities Skin: Large ulcer on plantar aspect of forefoot (under 1st metatarsal head) w/ brownish discharge on dressing.   Lab Results:  [facility] 09/21/11 0615 09/20/11 0720  NA 137 134*  K 3.7 4.5  CL 104 106  CO2 20 16*  GLUCOSE 166* 284*  BUN 36* 47*  CREATININE 1.69* 1.79*  CALCIUM 9.3 8.6  MG -- --  PHOS -- --   No results found for this basename: AST:2,ALT:2,ALKPHOS:2,BILITOT:2,PROT:2,ALBUMIN:2 in the last 72 hours No results found for this basename: LIPASE:2,AMYLASE:2 in the last 72 hours  Basename 09/21/11 0615 09/20/11 0720 09/19/11 1938  WBC 11.4* 11.3* 7.8  NEUTROABS -- -- --  HGB 11.8* 11.1* 10.5*  HCT 35.6* 32.4* 31.1*  MCV 88.6 87.1 87.9  PLT 160 140* 135*   No results found for this basename:  CKTOTAL:3,CKMB:3,CKMBINDEX:3,TROPONINI:3 in the last 72 hours No results found for this basename: POCBNP:3 in the last 72 hours No results found for this basename: DDIMER:2 in the last 72 hours No results found for this basename: HGBA1C:12 in the last 72 hours No results found for this basename: CHOL:2,HDL:2,LDLCALC:2,TRIG:2,CHOLHDL:2,LDLDIRECT:2 in the last 72 hours No results found for this basename: TSH,T4TOTAL,FREET3,T3FREE,THYROIDAB in the last 72 hours No results found for this basename: VITAMINB12:2,FOLATE:2,FERRITIN:2,TIBC:2,IRON:2,RETICCTPCT:2 in the last 72 hours  Micro Results: Recent Results (from the past 240 hour(s))  CULTURE, BLOOD (ROUTINE X 2)     Status: Normal   Collection Time   09/18/11  2:38 PM      Component Value Range Status Comment   Specimen Description BLOOD ARM LEFT   Final    Special Requests BOTTLES DRAWN AEROBIC ONLY 10CC   Final    Setup Time [id number]   Final    Culture     Final    Value: VIRIDANS STREPTOCOCCUS     Note: Gram Stain Report Called to,Read Back By and Verified With: CHRISTIAN TOOENTINO 09/19/11 @ 1822 HAJAM   Report Status 09/21/2011 FINAL   Final   CULTURE, BLOOD (ROUTINE SINGLE)     Status: Normal (Preliminary result)   Collection Time   09/18/11  4:07 PM      Component Value Range Status Comment   Specimen Description BLOOD ARM RIGHT   Final    Special Requests BOTTLES DRAWN AEROBIC AND ANAEROBIC 10CC  EACH   Final    Setup Time [id number]   Final    Culture     Final    Value:        BLOOD CULTURE RECEIVED NO GROWTH TO DATE CULTURE WILL BE HELD FOR 5 DAYS BEFORE ISSUING A FINAL NEGATIVE REPORT   Report Status PENDING   Incomplete   MRSA PCR SCREENING     Status: Normal   Collection Time   09/18/11  8:59 PM      Component Value Range Status Comment   MRSA by PCR NEGATIVE  NEGATIVE  Final   URINE CULTURE     Status: Normal   Collection Time   09/19/11  8:18 AM      Component Value Range Status Comment   Specimen Description  URINE, CLEAN CATCH   Final    Special Requests NONE   Final    Setup Time [id number]   Final    Colony Count NO GROWTH   Final    Culture NO GROWTH   Final    Report Status 09/20/2011 FINAL   Final     Studies/Results: Scheduled Meds:    . allopurinol  300 mg Oral Daily  . aspirin  325 mg Oral Daily  . clopidogrel  75 mg Oral Q breakfast  . glimepiride  4 mg Oral BID AC  . hydrALAZINE  10 mg Intravenous Once  . insulin aspart  0-20 Units Subcutaneous TID WC  . insulin aspart  0-5 Units Subcutaneous QHS  . insulin aspart  3 Units Subcutaneous TID WC  . insulin glargine  14 Units Subcutaneous QHS  . living well with diabetes book   Does not apply Once  . metoprolol tartrate  12.5 mg Oral BID  . piperacillin-tazobactam  3.375 g Intravenous Once  . piperacillin-tazobactam (ZOSYN)  IV  3.375 g Intravenous Q8H  . rosuvastatin  10 mg Oral Daily  . sodium chloride  3 mL Intravenous Q12H  . DISCONTD: vancomycin  1,500 mg Intravenous Q24H   Continuous Infusions:    . sodium chloride 10 mL/hr at 09/22/11 0000   PRN Meds:.acetaminophen, ALPRAZolam, dextrose, nitroGLYCERIN, ondansetron (ZOFRAN) IV, sodium chloride  Assessment/Plan:  Non-STEMI (non-ST elevated myocardial infarction) - S/P CABG x 4  The patient was taken to cardiac cath lab,  underwent PTCA and stent placement with a drug-eluting stent to one of his grafted vessels. Cardiology continues to follow. On ASA/Plavix  A-fib w/ RVR- noted on 11/8 but in sinus rhythm this AM. On Metoprolol 12.5 BID.  Cardiology not certain at this point if he should receive coumadin. He is on ASA and Plavix for MI.   Osteomyelitis L foot - Vascular surgery is following. Arteriogram need to be performed first.  On Zosyn.  1 out of 2 blood cultures positive - gram-positive cocci in pairs and chains . Strep. Viridans on one set of blood cultures- have discussed this w/ Dr Maurice March (ID) who suspects from the history that this is likely a  contaminant.   Cardiomyopathy, ischemic/ Chronic systolic CHF- w/ EF of 35 % This appears to be a new diagnosis. At the present time the patient appears to be volume compensated. On Losartan.   DM (diabetes mellitus) type I uncontrolled, periph vascular disorder We will continue strict sliding-scale insulin and adjust treatment to improve CBG control. Have resumed his Amaryl and Januvia (on pharmacy equivalent).   Diverticulosis of colon Quiescent at the present time  S/P carotid endarterectomy/peripheral vascular disease  Iron deficiency  anemia This is likely due to renal insufficiency as well as smoldering osteomyelitis. We will follow the hemoglobin trend.  Gout attack Appears to be much improved at the present time  HTN (hypertension) On Hydralazine, Losartan and Metoprolol. Can Increase Metoprolol today to 25 BID.     LOS: 4 days   [facility] 09/22/2011, 10:58 AM

## 2011-09-23 ENCOUNTER — Encounter (HOSPITAL_COMMUNITY): Payer: Self-pay

## 2011-09-23 LAB — GLUCOSE, CAPILLARY
Glucose-Capillary: 159 mg/dL — ABNORMAL HIGH (ref 70–99)
Glucose-Capillary: 183 mg/dL — ABNORMAL HIGH (ref 70–99)
Glucose-Capillary: 205 mg/dL — ABNORMAL HIGH (ref 70–99)

## 2011-09-23 LAB — BASIC METABOLIC PANEL
Calcium: 9.3 mg/dL (ref 8.4–10.5)
Creatinine, Ser: 1.69 mg/dL — ABNORMAL HIGH (ref 0.50–1.35)
GFR calc Af Amer: 45 mL/min — ABNORMAL LOW (ref >90)
Sodium: 136 mEq/L (ref 135–145)

## 2011-09-23 LAB — CBC
Platelets: 162 10*3/uL (ref 150–400)
RBC: 3.73 MIL/uL — ABNORMAL LOW (ref 4.22–5.81)
RDW: 14 % (ref 11.5–15.5)
WBC: 13.9 10*3/uL — ABNORMAL HIGH (ref 4.0–10.5)

## 2011-09-23 MED ORDER — ASPIRIN EC 325 MG PO TBEC
325.0000 mg | DELAYED_RELEASE_TABLET | Freq: Every day | ORAL | Status: DC
  Administered 2011-09-24 – 2011-09-26 (×3): 325 mg via ORAL
  Filled 2011-09-23 (×4): qty 1

## 2011-09-23 NOTE — Progress Notes (Signed)
SUBJECTIVE: The patient is doing well today.  At this time, he denies chest pain, shortness of breath, or any new concerns.  OBJECTIVE: Physical Exam: Filed Vitals:   09/23/11 0500 09/23/11 0545 09/23/11 0741 09/23/11 0753  BP:    136/60  Pulse:  74  90  Temp:  99.3 F (37.4 C) 100 F (37.8 C) 99.9 F (37.7 C)  TempSrc:  Oral  Oral  Resp:  18    Height:      Weight: 86.9 kg (191 lb 9.3 oz)     SpO2:  94%  97%    Intake/Output Summary (Last 24 hours) at 09/23/11 1034 Last data filed at 09/23/11 0900  Gross per 24 hour  Intake   1060 ml  Output   2475 ml  Net  -1415 ml   Telemetry reveals sinus rhythm presently, though he has had afib with V rates 120s  GEN- The patient is chronically ill appearing, alert and oriented x 3 today.   Head- normocephalic, atraumatic Eyes-  Sclera clear, conjunctiva pink Ears- hearing intact Oropharynx- clear Neck- supple, no JVP Lymph- no cervical lymphadenopathy Lungs- Clear to ausculation bilaterally, normal work of breathing Heart- Regular rate and rhythm,  GI- soft, NT, ND, + BS Extremities- no clubbing, cyanosis, trace edema  LABS: Basic Metabolic Panel:  Basename 09/23/11 0620 09/21/11 0615  NA 136 137  K 4.1 3.7  CL 102 104  CO2 23 20  GLUCOSE 87 166*  BUN 27* 36*  CREATININE 1.69* 1.69*  CALCIUM 9.3 9.3  MG -- --  PHOS -- --   Liver Function Tests: No results found for this basename: AST:2,ALT:2,ALKPHOS:2,BILITOT:2,PROT:2,ALBUMIN:2 in the last 72 hours No results found for this basename: LIPASE:2,AMYLASE:2 in the last 72 hours CBC:  Basename 09/23/11 0620 09/21/11 0615  WBC 13.9* 11.4*  NEUTROABS -- --  HGB 11.0* 11.8*  HCT 33.1* 35.6*  MCV 88.7 88.6  PLT 162 160   RADIOLOGY: Dg Chest 2 View  09/18/2011  *RADIOLOGY REPORT*  Clinical Data: Diabetic left foot ulcerations/abscess.  Prior CABG.  CHEST - 2 VIEW 09/18/2011:  Comparison: None.  Findings: Prior sternotomy for CABG.  Cardiac silhouette mildly enlarged.   Thoracic aorta tortuous and atherosclerotic.  Hilar and mediastinal contours otherwise unremarkable.  Suboptimal inspiration.  Pulmonary venous hypertension without overt edema. Lungs clear.  No pleural effusions.  Degenerative changes and DISH involving the thoracic spine.  IMPRESSION: Suboptimal inspiration.  Mild cardiomegaly.  Pulmonary venous hypertension without overt edema.  No acute cardiopulmonary disease.  Original Report Authenticated By: Arnell Sieving, M.D.   Dg Chest Portable 1 View  09/20/2011  *RADIOLOGY REPORT*  Clinical Data: Shortness of breath.  PORTABLE CHEST - 1 VIEW  Comparison: None.  Findings: Cardiomegaly.  Central vascular congestion.  Interval development of right greater than left airspace opacities.  Status post median sternotomy and CABG.  No acute osseous abnormality.  No pneumothorax.  There may be small pleural effusions.  IMPRESSION: Interval development of right greater than left airspace opacities. The rapid onset favors a process such as asymmetric edema.  Original Report Authenticated By: Waneta Martins, M.D.   Dg Foot Complete Left  09/18/2011  *RADIOLOGY REPORT*  Clinical Data: Ulcer at plantar aspect left foot with abscess  LEFT FOOT - COMPLETE 3+ VIEW  Comparison: None  Findings: Marked osseous demineralization. Scattered small vessel atherosclerotic calcifications. Joint spaces preserved. Soft tissue gas at great toe. Assessment of cortical integrity is suboptimal due to degree of bony demineralization. No  definite acute fracture, dislocation or bone destruction identified. It is difficult to exclude destruction at the lateral aspect of the first metatarsal head due to degree of demineralization.  IMPRESSION: Soft tissue gas at great toe consistent with infection. No definite acute bony abnormalities identified, though the head of the first metatarsal is difficult to assess. If osteomyelitis is clinically suspected, or assessment of extent of soft tissue  infection is required, recommend MR imaging with and without contrast.  Original Report Authenticated By: Lollie Marrow, M.D.    ASSESSMENT AND PLAN:  1.  NSTEMI-  Stable s/p PCI without symptoms of ischemia           Continue ASA, Plavix, beta blocker, statin. Renal function is stable. 2.  Afib- V rates are better   Continue metoprolol to 50mg  BID              Would hold off on coumadin until decisions are made about peripheral vascular disease. Can likely start warfarin before discharge. Aspirin can be stopped based on recent data. Plavix should be continued. 3.  PVD-  Pt with L foot ulcer.  The plan is for peripheral arteriogram on Monday by Dr Clifton James.  Further treatment (angioplasty vs surgery) depends on findings. 4.  1:2 blood cultures positive for strep veridans-  Continue ABx per internal medicine 5.  Hypertension- continue current medications. Transfer to telemetry    Lorine Bears, MD 09/23/2011 10:34 AM

## 2011-09-23 NOTE — Progress Notes (Signed)
Subjective: Deneis chest pain, feeling well no new complaints.  Objective: Filed Vitals:   09/23/11 0545 09/23/11 0741 09/23/11 0753 09/23/11 1158  BP:   136/60   Pulse: 74  90   Temp: 99.3 F (37.4 C) 100 F (37.8 C) 99.9 F (37.7 C) 98.6 F (37 C)  TempSrc: Oral  Oral Oral  Resp: 18     Height:      Weight:      SpO2: 94%  97%    Weight change: -2.4 kg (-5 lb 4.7 oz)  Intake/Output Summary (Last 24 hours) at 09/23/11 1344 Last data filed at 09/23/11 1000  Gross per 24 hour  Intake   1010 ml  Output   2475 ml  Net  -1465 ml    General: Alert, awake, oriented x3, in no acute distress.  HEENT: No bruits, no goiter.  Heart: Regular rate and rhythm, without murmurs, rubs, gallops.  Lungs: , bilateral air movement no wheezes.  Abdomen: Soft, nontender, nondistended, positive bowel sounds.  Neuro: Grossly intact, nonfocal.   Lab Results:  [facility] 09/23/11 0620 09/21/11 0615  NA 136 137  K 4.1 3.7  CL 102 104  CO2 23 20  GLUCOSE 87 166*  BUN 27* 36*  CREATININE 1.69* 1.69*  CALCIUM 9.3 9.3  MG -- --  PHOS -- --    Basename 09/23/11 0620 09/21/11 0615  WBC 13.9* 11.4*  NEUTROABS -- --  HGB 11.0* 11.8*  HCT 33.1* 35.6*  MCV 88.7 88.6  PLT 162 160   Micro Results: Recent Results (from the past 240 hour(s))  CULTURE, BLOOD (ROUTINE X 2)     Status: Normal   Collection Time   09/18/11  2:38 PM      Component Value Range Status Comment   Specimen Description BLOOD ARM LEFT   Final    Special Requests BOTTLES DRAWN AEROBIC ONLY 10CC   Final    Setup Time [id number]   Final    Culture     Final    Value: VIRIDANS STREPTOCOCCUS     Note: Gram Stain Report Called to,Read Back By and Verified With: CHRISTIAN TOOENTINO 09/19/11 @ 1822 HAJAM   Report Status 09/21/2011 FINAL   Final   CULTURE, BLOOD (ROUTINE SINGLE)     Status: Normal (Preliminary result)   Collection Time   09/18/11  4:07 PM      Component Value Range Status Comment   Specimen Description  BLOOD ARM RIGHT   Final    Special Requests BOTTLES DRAWN AEROBIC AND ANAEROBIC 10CC EACH   Final    Setup Time [id number]   Final    Culture     Final    Value:        BLOOD CULTURE RECEIVED NO GROWTH TO DATE CULTURE WILL BE HELD FOR 5 DAYS BEFORE ISSUING A FINAL NEGATIVE REPORT   Report Status PENDING   Incomplete   MRSA PCR SCREENING     Status: Normal   Collection Time   09/18/11  8:59 PM      Component Value Range Status Comment   MRSA by PCR NEGATIVE  NEGATIVE  Final   URINE CULTURE     Status: Normal   Collection Time   09/19/11  8:18 AM      Component Value Range Status Comment   Specimen Description URINE, CLEAN CATCH   Final    Special Requests NONE   Final    Setup Time [id number]   Final  Colony Count NO GROWTH   Final    Culture NO GROWTH   Final    Report Status 09/20/2011 FINAL   Final     Studies/Results: No results found.  Medications: I have reviewed the patient's current medications.   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 Cardiology continues to follow.  On ASA/Plavix. Denies chest pain.  A-fib w/ RVR- noted on 11/8 but now sinus rhythm. On Metoprolol 12.5 BID.   He is on ASA and Plavix for MI. Hold off on coumadin until decisions are made about peripheral vascular disease  Osteomyelitis L foot - Vascular surgery is following. Arteriogram on monday. On Zosyn.   X ray showed :  No definite acute bony abnormalities identified, though the head of the first metatarsal is difficult to assess. If osteomyelitis is clinically suspected, or assessment of extent of soft tissue infection is required, recommend MR imaging. Will order MRI if no osteomyelitis this will change antibiotics course. WBC increasing, had mild fever. I will add Vancomycin.  1 out of 2 blood  cultures positive - gram-positive cocci in pairs and chains  . Strep. Viridans on one set of blood cultures- have discussed this w/ Dr Maurice March (ID) who suspects from the history that this is likely a contaminant.  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 At the present time the patient appears to be volume compensated.  No ACE due to renal insufficiency. Medical management. DM (diabetes mellitus) type I uncontrolled, periph vascular disorder   Have resumed his Amaryl and Januvia (on pharmacy equivalent).   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 We will follow the hemoglobin trend.  HB stable.  Gout attack  Appears to be much improved at the present time  HTN (hypertension)   Metoprolol. Will consider Hydralazine for better Blood pressure controlled.  Chronic Renal insufficiency: Cr stable. Continue to monitor. CR was at 1.8 on July 2005.     LOS: 5 days   Jianna Drabik 09/23/2011, 1:44 PM

## 2011-09-23 NOTE — Progress Notes (Signed)
ANTIBIOTIC CONSULT NOTE - FOLLOW UP  Pharmacy Consult for Vancomycin  / Zosyn Indication: Bacteremia, osteomyelitis  Allergies  Allergen Reactions  . Altace     Patient Measurements: Height: 5\' 10"  (177.8 cm) Weight: 191 lb 9.3 oz (86.9 kg) IBW/kg (Calculated) : 73   Vital Signs: Temp: 98.6 F (37 C) (11/10 1158) Temp src: Oral (11/10 1158) BP: 144/49 mmHg (11/10 1158) Pulse Rate: 82  (11/10 1158) Intake/Output from previous day: 11/09 0701 - 11/10 0700 In: 737.5 [P.O.:360; I.V.:240; IV Piggyback:137.5] Out: 2975 [Urine:2975] Intake/Output from this shift: Total I/O In: 672.5 [P.O.:600; I.V.:60; IV Piggyback:12.5] Out: 400 [Urine:400]  Labs:  [facility] 09/23/11 0620 09/21/11 0615  WBC 13.9* 11.4*  HGB 11.0* 11.8*  PLT 162 160  LABCREA -- --  CREATININE 1.69* 1.69*   Estimated Creatinine Clearance: 40.8 ml/min (by C-G formula based on Cr of 1.69).   Microbiology: Recent Results (from the past 720 hour(s))  CULTURE, BLOOD (ROUTINE X 2)     Status: Normal   Collection Time   09/18/11  2:38 PM      Component Value Range Status Comment   Specimen Description BLOOD ARM LEFT   Final    Special Requests BOTTLES DRAWN AEROBIC ONLY 10CC   Final    Setup Time [id number]   Final    Culture     Final    Value: VIRIDANS STREPTOCOCCUS     Note: Gram Stain Report Called to,Read Back By and Verified With: CHRISTIAN TOOENTINO 09/19/11 @ 1822 HAJAM   Report Status 09/21/2011 FINAL   Final   CULTURE, BLOOD (ROUTINE SINGLE)     Status: Normal (Preliminary result)   Collection Time   09/18/11  4:07 PM      Component Value Range Status Comment   Specimen Description BLOOD ARM RIGHT   Final    Special Requests BOTTLES DRAWN AEROBIC AND ANAEROBIC 10CC EACH   Final    Setup Time [id number]   Final    Culture     Final    Value:        BLOOD CULTURE RECEIVED NO GROWTH TO DATE CULTURE WILL BE HELD FOR 5 DAYS BEFORE ISSUING A FINAL NEGATIVE REPORT   Report Status PENDING    Incomplete   MRSA PCR SCREENING     Status: Normal   Collection Time   09/18/11  8:59 PM      Component Value Range Status Comment   MRSA by PCR NEGATIVE  NEGATIVE  Final   URINE CULTURE     Status: Normal   Collection Time   09/19/11  8:18 AM      Component Value Range Status Comment   Specimen Description URINE, CLEAN CATCH   Final    Special Requests NONE   Final    Setup Time [id number]   Final    Colony Count NO GROWTH   Final    Culture NO GROWTH   Final    Report Status 09/20/2011 FINAL   Final      Assessment: 70 year old man with L foot infection possible osteomyelitis. Vancomycin s/p 3 days of tx d/c 11/8 now restart and Zosyn Day #5  positive blood culture Strep viridans probably contaminant.  Plan MRI for r/o osteo.  WBC inc to 13, Tm 99  Goal of Therapy:  Vancomycin trough level 15-20 mcg/ml  Plan:  Continue Zosyn 3.375 gg q8 Restart Vancomycin 1500mg  iv q24  Marcelino Scot 09/23/2011,2:57 PM

## 2011-09-23 NOTE — Progress Notes (Signed)
  Spoke with Dr. Johney Frame yesterday.  Pt scheduled for arteriogram +/- intervention by Dr Rosalie Gums on 09/25/11.  If a percutaneous solution for his PAD is not possible, he will need at least 4-6 weeks of recovery from MI before considering bypass for his leg if necessary.  If foot continues to deteriorate he may need bka.  Fabienne Bruns, MD [facility] Office: [phone] Pager: [phone]

## 2011-09-24 ENCOUNTER — Inpatient Hospital Stay (HOSPITAL_COMMUNITY): Payer: Medicare PPO

## 2011-09-24 LAB — BASIC METABOLIC PANEL
BUN: 28 mg/dL — ABNORMAL HIGH (ref 6–23)
CO2: 24 mEq/L (ref 19–32)
Calcium: 9.2 mg/dL (ref 8.4–10.5)
Creatinine, Ser: 1.79 mg/dL — ABNORMAL HIGH (ref 0.50–1.35)
GFR calc Af Amer: 42 mL/min — ABNORMAL LOW (ref >90)
GFR calc non Af Amer: 36 mL/min — ABNORMAL LOW (ref >90)
Glucose, Bld: 131 mg/dL — ABNORMAL HIGH (ref 70–99)
Potassium: 4 mEq/L (ref 3.5–5.1)
Sodium: 135 mEq/L (ref 135–145)

## 2011-09-24 LAB — CULTURE, BLOOD (SINGLE): Culture  Setup Time: 201211052249

## 2011-09-24 LAB — CBC
Hemoglobin: 11 g/dL — ABNORMAL LOW (ref 13.0–17.0)
MCH: 30 pg (ref 26.0–34.0)
RBC: 3.67 MIL/uL — ABNORMAL LOW (ref 4.22–5.81)

## 2011-09-24 LAB — GLUCOSE, CAPILLARY

## 2011-09-24 MED ORDER — HYDRALAZINE HCL 10 MG PO TABS
10.0000 mg | ORAL_TABLET | Freq: Three times a day (TID) | ORAL | Status: DC
  Administered 2011-09-24 – 2011-09-27 (×9): 10 mg via ORAL
  Filled 2011-09-24 (×12): qty 1

## 2011-09-24 MED ORDER — VANCOMYCIN HCL 1000 MG IV SOLR
1500.0000 mg | INTRAVENOUS | Status: DC
  Administered 2011-09-24 – 2011-09-27 (×4): 1500 mg via INTRAVENOUS
  Filled 2011-09-24 (×4): qty 1500

## 2011-09-24 MED ORDER — SODIUM CHLORIDE 0.45 % IV SOLN
INTRAVENOUS | Status: DC
  Administered 2011-09-26 – 2011-09-27 (×2): via INTRAVENOUS
  Administered 2011-09-28: 15 mL/h via INTRAVENOUS

## 2011-09-24 NOTE — Progress Notes (Addendum)
Subjective: Denies chest pain or dyspnea. Feeling ok. Denies left foot pain.  Objective: Filed Vitals:   09/23/11 0753 09/23/11 1158 09/23/11 1600 09/23/11 1909  BP: 136/60 144/49 135/50 153/70  Pulse: 90 82 78 72  Temp: 99.9 F (37.7 C) 98.6 F (37 C) 99.7 F (37.6 C) 97.7 F (36.5 C)  TempSrc: Oral Oral Oral Oral  Resp:    18  Height:      Weight:    84.505 kg (186 lb 4.8 oz)  SpO2: 97% 95%  98%   Weight change: -2.395 kg (-5 lb 4.5 oz)  Intake/Output Summary (Last 24 hours) at 09/24/11 0944 Last data filed at 09/23/11 2322  Gross per 24 hour  Intake    410 ml  Output    950 ml  Net   -540 ml    General: Alert, awake, oriented x3, in no acute distress.  HEENT: No bruits, no goiter.  Heart: Regular rate and rhythm, without murmurs, rubs, gallops.  Lungs: , bilateral air movement, no wheezes or crackles.  Abdomen: Soft, nontender, nondistended, positive bowel sounds.  Neuro: Grossly intact, nonfocal. Extremities: Left foot with dressing, purplish discoloration of the big toe.   Lab Results:  [facility] 09/24/11 0545 09/23/11 0620  NA 135 136  K 4.0 4.1  CL 101 102  CO2 24 23  GLUCOSE 131* 87  BUN 28* 27*  CREATININE 1.79* 1.69*  CALCIUM 9.2 9.3  MG -- --  PHOS -- --    Basename 09/24/11 0545 09/23/11 0620  WBC 13.9* 13.9*  NEUTROABS -- --  HGB 11.0* 11.0*  HCT 32.4* 33.1*  MCV 88.3 88.7  PLT 173 162   Micro Results: Recent Results (from the past 240 hour(s))  CULTURE, BLOOD (ROUTINE X 2)     Status: Normal   Collection Time   09/18/11  2:38 PM      Component Value Range Status Comment   Specimen Description BLOOD ARM LEFT   Final    Special Requests BOTTLES DRAWN AEROBIC ONLY 10CC   Final    Setup Time [id number]   Final    Culture     Final    Value: VIRIDANS STREPTOCOCCUS     Note: Gram Stain Report Called to,Read Back By and Verified With: CHRISTIAN TOOENTINO 09/19/11 @ 1822 HAJAM   Report Status 09/21/2011 FINAL   Final   CULTURE, BLOOD  (ROUTINE SINGLE)     Status: Normal (Preliminary result)   Collection Time   09/18/11  4:07 PM      Component Value Range Status Comment   Specimen Description BLOOD ARM RIGHT   Final    Special Requests BOTTLES DRAWN AEROBIC AND ANAEROBIC 10CC EACH   Final    Setup Time [id number]   Final    Culture     Final    Value:        BLOOD CULTURE RECEIVED NO GROWTH TO DATE CULTURE WILL BE HELD FOR 5 DAYS BEFORE ISSUING A FINAL NEGATIVE REPORT   Report Status PENDING   Incomplete   MRSA PCR SCREENING     Status: Normal   Collection Time   09/18/11  8:59 PM      Component Value Range Status Comment   MRSA by PCR NEGATIVE  NEGATIVE  Final   URINE CULTURE     Status: Normal   Collection Time   09/19/11  8:18 AM      Component Value Range Status Comment   Specimen Description URINE, CLEAN CATCH  Final    Special Requests NONE   Final    Setup Time [id number]   Final    Colony Count NO GROWTH   Final    Culture NO GROWTH   Final    Report Status 09/20/2011 FINAL   Final     Studies/Results: No results found.  Medications: I have reviewed the patient's current medications.   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 Cardiology continues to follow.  On ASA/Plavix. Denies chest pain.   A-fib w/ RVR- noted on 11/8 but now sinus rhythm. On Metoprolol 50 mg BID.  He is on ASA and Plavix for MI. Hold off on coumadin until decisions are made about peripheral vascular disease.   Osteomyelitis L foot -  Arteriogram on monday. On Zosyn day 6. and Vancomycin. ( started 10-10) . Still with mild fever and leukocytosis , depending on MRI result will consider infections diseases consult. Vanc just started yesterday.   1 out of 2 blood cultures positive - gram-positive cocci in pairs and chains  Strep. Viridans on one set of blood cultures- have  discussed this w/ Dr Maurice March (ID) who suspects from the history that this is likely a contaminant.  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 At the present time the patient appears to be volume compensated.  No ACE due to renal insufficiency. Medical management.  DM (diabetes mellitus) type I uncontrolled, periph vascular disorder  Have resumed his Amaryl and Januvia (on pharmacy equivalent).  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 We will follow the hemoglobin trend.  HB stable.  Gout attack  Appears to be much improved at the present time  HTN (hypertension)  Metoprolol.  Chronic Renal insufficiency: Cr stable. Continue to monitor. CR was at 1.8 on July 2005.       LOS: 6 days   Orie Baxendale Pager: [phone] [facility] 09/24/2011, 9:44 AM  MRI result noticed. I spoke with Dr fields plan is as follow: : If a percutaneous solution for his PAD is  possible,  then patient will required transmetatarsal amputation. If  percutaneous solution for his PAD is not  Possible, then patient will need BKA. Will monitor for sign of sepsis.

## 2011-09-24 NOTE — Progress Notes (Signed)
Pharmacy:  Vancomycin  Orders received to start Vancomycin 11/10.  Consult note written.  Vancomycin doses were not put into [facility].  Will enter Vancomycin 1.5g IV q24. To start now.  Mickeal Skinner 09/24/2011

## 2011-09-24 NOTE — Progress Notes (Signed)
SUBJECTIVE  Doing well. NSR. No compliants No SCP. No SOB. Anticipating angiogram in AM    Filed Vitals:   09/23/11 1600 09/23/11 1909 09/24/11 0549 09/24/11 1010  BP: 135/50 153/70  138/60  Pulse: 78 72 68   Temp: 99.7 F (37.6 C) 97.7 F (36.5 C)    TempSrc: Oral Oral    Resp:  18    Height:      Weight:  186 lb 4.8 oz (84.505 kg)    SpO2:  98%      Intake/Output Summary (Last 24 hours) at 09/24/11 1020 Last data filed at 09/24/11 0810  Gross per 24 hour  Intake    640 ml  Output    950 ml  Net   -310 ml    LABS: Basic Metabolic Panel:  Basename 09/24/11 0545 09/23/11 0620  NA 135 136  K 4.0 4.1  CL 101 102  CO2 24 23  GLUCOSE 131* 87  BUN 28* 27*  CREATININE 1.79* 1.69*  CALCIUM 9.2 9.3  MG -- --  PHOS -- --   Liver Function Tests: No results found for this basename: AST:2,ALT:2,ALKPHOS:2,BILITOT:2,PROT:2,ALBUMIN:2 in the last 72 hours No results found for this basename: LIPASE:2,AMYLASE:2 in the last 72 hours CBC:  Basename 09/24/11 0545 09/23/11 0620  WBC 13.9* 13.9*  NEUTROABS -- --  HGB 11.0* 11.0*  HCT 32.4* 33.1*  MCV 88.3 88.7  PLT 173 162   Cardiac Enzymes: No results found for this basename: CKTOTAL:3,CKMB:3,CKMBINDEX:3,TROPONINI:3 in the last 72 hours BNP: No results found for this basename: POCBNP:3 in the last 72 hours D-Dimer: No results found for this basename: DDIMER:2 in the last 72 hours Hemoglobin A1C: No results found for this basename: HGBA1C in the last 72 hours Fasting Lipid Panel: No results found for this basename: CHOL,HDL,LDLCALC,TRIG,CHOLHDL,LDLDIRECT in the last 72 hours Thyroid Function Tests: No results found for this basename: TSH,T4TOTAL,FREET3,T3FREE,THYROIDAB in the last 72 hours Anemia Panel: No results found for this basename: VITAMINB12,FOLATE,FERRITIN,TIBC,IRON,RETICCTPCT in the last 72 hours  RADIOLOGY: Dg Chest 2 View  09/18/2011  *RADIOLOGY REPORT*  Clinical Data: Diabetic left foot  ulcerations/abscess.  Prior CABG.  CHEST - 2 VIEW 09/18/2011:  Comparison: None.  Findings: Prior sternotomy for CABG.  Cardiac silhouette mildly enlarged.  Thoracic aorta tortuous and atherosclerotic.  Hilar and mediastinal contours otherwise unremarkable.  Suboptimal inspiration.  Pulmonary venous hypertension without overt edema. Lungs clear.  No pleural effusions.  Degenerative changes and DISH involving the thoracic spine.  IMPRESSION: Suboptimal inspiration.  Mild cardiomegaly.  Pulmonary venous hypertension without overt edema.  No acute cardiopulmonary disease.  Original Report Authenticated By: Arnell Sieving, M.D.   Dg Chest Portable 1 View  09/20/2011  *RADIOLOGY REPORT*  Clinical Data: Shortness of breath.  PORTABLE CHEST - 1 VIEW  Comparison: None.  Findings: Cardiomegaly.  Central vascular congestion.  Interval development of right greater than left airspace opacities.  Status post median sternotomy and CABG.  No acute osseous abnormality.  No pneumothorax.  There may be small pleural effusions.  IMPRESSION: Interval development of right greater than left airspace opacities. The rapid onset favors a process such as asymmetric edema.  Original Report Authenticated By: Waneta Martins, M.D.   Dg Foot Complete Left  09/18/2011  *RADIOLOGY REPORT*  Clinical Data: Ulcer at plantar aspect left foot with abscess  LEFT FOOT - COMPLETE 3+ VIEW  Comparison: None  Findings: Marked osseous demineralization. Scattered small vessel atherosclerotic calcifications. Joint spaces preserved. Soft tissue gas at great toe. Assessment  of cortical integrity is suboptimal due to degree of bony demineralization. No definite acute fracture, dislocation or bone destruction identified. It is difficult to exclude destruction at the lateral aspect of the first metatarsal head due to degree of demineralization.  IMPRESSION: Soft tissue gas at great toe consistent with infection. No definite acute bony abnormalities  identified, though the head of the first metatarsal is difficult to assess. If osteomyelitis is clinically suspected, or assessment of extent of soft tissue infection is required, recommend MR imaging with and without contrast.  Original Report Authenticated By: Lollie Marrow, M.D.    PHYSICAL EXAM General:well nourished HEENT:6 cm JVP. Nl carotid upstroke Lungs:clear BS B Heart:RRR, nls S1/s2 Abdomen:soft, non tender Extremity exam:No edema.  Neurologic:Oriented   TELEMETRY: ReviewedNSR  ASSESSMENT AND PLAN:  Principal Problem: 1. NSTEMI- Stable s/p PCI without symptoms of ischemia  Continue ASA, Plavix, beta blocker, statin.   2. Afib- NSR Continue metoprolol to 50mg  BID  Would hold off on coumadin until decisions are made about peripheral vascular disease. Can likely start warfarin before discharge. Aspirin can be stopped based on recent data. Plavix should be continued.  3. PVD- Pt with L foot ulcer. The plan is for peripheral arteriogram on Monday by Dr Clifton James. Further treatment (angioplasty vs surgery) depends on findings.  4. 1:2 blood cultures positive for strep veridans- Continue ABx per internal medicine  5. Hypertension- continue current medications. CRI- increase creatinine . Will give IV fluids overnight  Alvin Critchley Gent,MD 10:20 AM 09/24/2011

## 2011-09-25 ENCOUNTER — Encounter (HOSPITAL_COMMUNITY)
Admission: EM | Disposition: A | Discharge status: Rehabilitation Facility | Payer: Self-pay | Source: Home / Self Care | Attending: Internal Medicine

## 2011-09-25 ENCOUNTER — Encounter (HOSPITAL_COMMUNITY): Payer: Self-pay | Admitting: Cardiovascular Disease

## 2011-09-25 DIAGNOSIS — I739 Peripheral vascular disease, unspecified: Secondary | ICD-10-CM

## 2011-09-25 HISTORY — PX: LOWER EXTREMITY ANGIOGRAM: SHX5508

## 2011-09-25 LAB — BASIC METABOLIC PANEL
BUN: 29 mg/dL — ABNORMAL HIGH (ref 6–23)
CO2: 23 mEq/L (ref 19–32)
Chloride: 103 mEq/L (ref 96–112)
GFR calc non Af Amer: 35 mL/min — ABNORMAL LOW (ref >90)
Glucose, Bld: 80 mg/dL (ref 70–99)
Potassium: 3.4 mEq/L — ABNORMAL LOW (ref 3.5–5.1)
Sodium: 135 mEq/L (ref 135–145)

## 2011-09-25 LAB — CBC
HCT: 30.8 % — ABNORMAL LOW (ref 39.0–52.0)
Hemoglobin: 10.3 g/dL — ABNORMAL LOW (ref 13.0–17.0)
MCHC: 33.4 g/dL (ref 30.0–36.0)
RBC: 3.52 MIL/uL — ABNORMAL LOW (ref 4.22–5.81)
WBC: 13.9 10*3/uL — ABNORMAL HIGH (ref 4.0–10.5)

## 2011-09-25 LAB — GLUCOSE, CAPILLARY
Glucose-Capillary: 71 mg/dL (ref 70–99)
Glucose-Capillary: 67 mg/dL — ABNORMAL LOW (ref 70–99)
Glucose-Capillary: 135 mg/dL — ABNORMAL HIGH (ref 70–99)
Glucose-Capillary: 161 mg/dL — ABNORMAL HIGH (ref 70–99)

## 2011-09-25 SURGERY — ANGIOGRAM, LOWER EXTREMITY
Anesthesia: LOCAL

## 2011-09-25 MED ORDER — POTASSIUM CHLORIDE CRYS ER 20 MEQ PO TBCR
20.0000 meq | EXTENDED_RELEASE_TABLET | Freq: Once | ORAL | Status: AC
  Administered 2011-09-26: 20 meq via ORAL
  Filled 2011-09-25: qty 1

## 2011-09-25 MED ORDER — HEPARIN (PORCINE) IN NACL 2-0.9 UNIT/ML-% IJ SOLN
INTRAMUSCULAR | Status: AC
  Filled 2011-09-25: qty 2000

## 2011-09-25 MED ORDER — LIDOCAINE HCL (PF) 1 % IJ SOLN
INTRAMUSCULAR | Status: AC
  Filled 2011-09-25: qty 30

## 2011-09-25 MED ORDER — SODIUM CHLORIDE 0.9 % IV SOLN
1.0000 mL/kg/h | INTRAVENOUS | Status: AC
  Administered 2011-09-25: 1 mL/kg/h via INTRAVENOUS

## 2011-09-25 MED ORDER — MIDAZOLAM HCL 2 MG/2ML IJ SOLN
INTRAMUSCULAR | Status: AC
  Filled 2011-09-25: qty 2

## 2011-09-25 NOTE — Progress Notes (Signed)
ANTIBIOTIC CONSULT NOTE - FOLLOW UP  Pharmacy Consult for Vancomycin D#2 and Zosyn D#6 Indication: Osteomyelitis  Allergies  Allergen Reactions  . Altace     Patient Measurements: Height: 5\' 10"  (177.8 cm) Weight: 190 lb 9.6 oz (86.456 kg) IBW/kg (Calculated) : 73    Vital Signs: Temp: 99.8 F (37.7 C) (11/12 0608) Temp src: Oral (11/12 0608) BP: 126/56 mmHg (11/12 0608) Pulse Rate: 69  (11/12 0608) Intake/Output from previous day: 11/11 0701 - 11/12 0700 In: 600 [P.O.:600] Out: 850 [Urine:850] Intake/Output from this shift: Total I/O In: -  Out: 400 [Urine:400]  Labs:  [facility] 09/25/11 0710 09/24/11 0545 09/23/11 0620  WBC 13.9* 13.9* 13.9*  HGB 10.3* 11.0* 11.0*  PLT 193 173 162  LABCREA -- -- --  CREATININE 1.86* 1.79* 1.69*   Estimated Creatinine Clearance: 37.1 ml/min (by C-G formula based on Cr of 1.86).   Microbiology: Recent Results (from the past 720 hour(s))  CULTURE, BLOOD (ROUTINE X 2)     Status: Normal   Collection Time   09/18/11  2:38 PM      Component Value Range Status Comment   Specimen Description BLOOD ARM LEFT   Final    Special Requests BOTTLES DRAWN AEROBIC ONLY 10CC   Final    Setup Time [id number]   Final    Culture     Final    Value: VIRIDANS STREPTOCOCCUS     Note: Gram Stain Report Called to,Read Back By and Verified With: CHRISTIAN TOOENTINO 09/19/11 @ 1822 HAJAM   Report Status 09/21/2011 FINAL   Final   CULTURE, BLOOD (ROUTINE SINGLE)     Status: Normal   Collection Time   09/18/11  4:07 PM      Component Value Range Status Comment   Specimen Description BLOOD ARM RIGHT   Final    Special Requests BOTTLES DRAWN AEROBIC AND ANAEROBIC 10CC EACH   Final    Setup Time [id number]   Final    Culture NO GROWTH 5 DAYS   Final    Report Status 09/24/2011 FINAL   Final   MRSA PCR SCREENING     Status: Normal   Collection Time   09/18/11  8:59 PM      Component Value Range Status Comment   MRSA by PCR NEGATIVE  NEGATIVE   Final   URINE CULTURE     Status: Normal   Collection Time   09/19/11  8:18 AM      Component Value Range Status Comment   Specimen Description URINE, CLEAN CATCH   Final    Special Requests NONE   Final    Setup Time [id number]   Final    Colony Count NO GROWTH   Final    Culture NO GROWTH   Final    Report Status 09/20/2011 FINAL   Final     Anti-infectives     Start     Dose/Rate Route Frequency Ordered Stop   09/24/11 1300   vancomycin (VANCOCIN) 1,500 mg in sodium chloride 0.9 % 500 mL IVPB        1,500 mg 250 mL/hr over 120 Minutes Intravenous Every 24 hours 09/24/11 1136     09/19/11 1000   vancomycin (VANCOCIN) 1,500 mg in sodium chloride 0.9 % 500 mL IVPB  Status:  Discontinued        1,500 mg 250 mL/hr over 120 Minutes Intravenous Every 24 hours 09/18/11 1733 09/18/11 2315   09/18/11 2300  piperacillin-tazobactam (ZOSYN) IVPB 3.375 g  3.375 g 12.5 mL/hr over 240 Minutes Intravenous 3 times per day 09/18/11 1733     09/18/11 2000   vancomycin (VANCOCIN) 1,500 mg in sodium chloride 0.9 % 500 mL IVPB  Status:  Discontinued        1,500 mg 250 mL/hr over 120 Minutes Intravenous Every 24 hours 09/18/11 1946 09/21/11 1509   09/18/11 1500  piperacillin-tazobactam (ZOSYN) IVPB 3.375 g       3.375 g 100 mL/hr over 30 Minutes Intravenous  Once 09/18/11 1431 09/22/11 1430   09/18/11 1445   vancomycin (VANCOCIN) IVPB 1000 mg/200 mL premix  Status:  Discontinued        1,000 mg 200 mL/hr over 60 Minutes Intravenous  Once 09/18/11 1431 09/18/11 1945          Assessment: 70 yom with osteomyelitis of LE.  Blood cultures 11/5=strep viridan 1/2 which is consistent with contaminant.  Was on vancomycin previously this admission but re-ordered 11/10 and first dose 11/11.  SCr is trending up slowly. UOP = 11/11.   Goal of Therapy:  Vancomycin trough level 15-20 mcg/ml  Plan:  1. Continue vancomycin 1500mg  IV q24h and Zosyn 3.375 gm IV q8h extended infusion.  Need to  monitor renal function and may need to check vancomyin level vs. Empirically decrease dose.   Dannielle Huh 09/25/2011,11:20 AM

## 2011-09-25 NOTE — Op Note (Signed)
Peripheral Vascular Procedure Note  CORDELL GUERCIO [id number] 11/12/201211:41 AM GREEN, [name], MD, MD  Procedure Performed:  1. Distal aortogram with selective left lower extremity runoff.    Operator: Verne Carrow, MD  Indication: Non-healing wound left foot with reduced ABI in the left leg.                                   Procedure Details: The risks, benefits, complications, treatment options, and expected outcomes were discussed with the patient. The patient and/or family concurred with the proposed plan, giving informed consent. The patient was brought to the [facility] lab after IV hydration was begun. The patient was further sedated with Versed 1 mg IV x 1. The right groin was prepped and draped in the usual manner. Using the modified Seldinger access technique, a 5 French sheath was placed in the right  femoral artery. A pigtail catheter was used to perform angiography of the distal aorta visualizing both renal arteries. The catheter was exchanged for a LIMA catheter. This catheter was used to selectively engage the left common iliac artery. Angiography of the left iliac system was performed. We then performed a runoff of the left lower extremity. There were no immediate complications. The patient was taken to the recovery area in stable condition.   Hemodynamic Findings: Central aortic pressure:157/56  Findings:  The distal aorta had mild plaque. No aneurysm or significant stenosis was noted.   The bilateral renal arteries were patent.   The left common iliac artery had 30% stenosis. Mild plaque left external iliac artery. The left common femoral artery is patent. The left superficial femoral artery had diffuse 50% stenosis but no focal areas of stenosis. The anterior tibial artery, posterior tibial artery and peroneal artery were occluded proximally. There was an extensive collateral network with reconstitution of the peroneal artery distally.    Impression: 1. Severe  peripheral arterial disease of the left lower extemity as described above. No good percutaneous options.   Recommendations: I have reviewed the case with Dr. Darrick Penna of Vascular surgery. The patient has a non-healing wound on his left foot with possible osteomyelitis. There are no good percutaneous revascularization options. The most likely outcome will be below the knee amputation. I will let Dr. Darrick Penna discuss bypass versus amputation.        Complications:  None; patient tolerated the procedure well.

## 2011-09-25 NOTE — H&P (View-Only) (Signed)
SUBJECTIVE  Doing well. NSR. No compliants No SCP. No SOB. Anticipating angiogram in AM    Filed Vitals:   09/23/11 1600 09/23/11 1909 09/24/11 0549 09/24/11 1010  BP: 135/50 153/70  138/60  Pulse: 78 72 68   Temp: 99.7 F (37.6 C) 97.7 F (36.5 C)    TempSrc: Oral Oral    Resp:  18    Height:      Weight:  186 lb 4.8 oz (84.505 kg)    SpO2:  98%      Intake/Output Summary (Last 24 hours) at 09/24/11 1020 Last data filed at 09/24/11 0810  Gross per 24 hour  Intake    640 ml  Output    950 ml  Net   -310 ml    LABS: Basic Metabolic Panel:  Basename 09/24/11 0545 09/23/11 0620  NA 135 136  K 4.0 4.1  CL 101 102  CO2 24 23  GLUCOSE 131* 87  BUN 28* 27*  CREATININE 1.79* 1.69*  CALCIUM 9.2 9.3  MG -- --  PHOS -- --   Liver Function Tests: No results found for this basename: AST:2,ALT:2,ALKPHOS:2,BILITOT:2,PROT:2,ALBUMIN:2 in the last 72 hours No results found for this basename: LIPASE:2,AMYLASE:2 in the last 72 hours CBC:  Basename 09/24/11 0545 09/23/11 0620  WBC 13.9* 13.9*  NEUTROABS -- --  HGB 11.0* 11.0*  HCT 32.4* 33.1*  MCV 88.3 88.7  PLT 173 162   Cardiac Enzymes: No results found for this basename: CKTOTAL:3,CKMB:3,CKMBINDEX:3,TROPONINI:3 in the last 72 hours BNP: No results found for this basename: POCBNP:3 in the last 72 hours D-Dimer: No results found for this basename: DDIMER:2 in the last 72 hours Hemoglobin A1C: No results found for this basename: HGBA1C in the last 72 hours Fasting Lipid Panel: No results found for this basename: CHOL,HDL,LDLCALC,TRIG,CHOLHDL,LDLDIRECT in the last 72 hours Thyroid Function Tests: No results found for this basename: TSH,T4TOTAL,FREET3,T3FREE,THYROIDAB in the last 72 hours Anemia Panel: No results found for this basename: VITAMINB12,FOLATE,FERRITIN,TIBC,IRON,RETICCTPCT in the last 72 hours  RADIOLOGY: Dg Chest 2 View  09/18/2011  *RADIOLOGY REPORT*  Clinical Data: Diabetic left foot  ulcerations/abscess.  Prior CABG.  CHEST - 2 VIEW 09/18/2011:  Comparison: None.  Findings: Prior sternotomy for CABG.  Cardiac silhouette mildly enlarged.  Thoracic aorta tortuous and atherosclerotic.  Hilar and mediastinal contours otherwise unremarkable.  Suboptimal inspiration.  Pulmonary venous hypertension without overt edema. Lungs clear.  No pleural effusions.  Degenerative changes and DISH involving the thoracic spine.  IMPRESSION: Suboptimal inspiration.  Mild cardiomegaly.  Pulmonary venous hypertension without overt edema.  No acute cardiopulmonary disease.  Original Report Authenticated By: Arlington E. LAWRENCE, M.D.   Dg Chest Portable 1 View  09/20/2011  *RADIOLOGY REPORT*  Clinical Data: Shortness of breath.  PORTABLE CHEST - 1 VIEW  Comparison: None.  Findings: Cardiomegaly.  Central vascular congestion.  Interval development of right greater than left airspace opacities.  Status post median sternotomy and CABG.  No acute osseous abnormality.  No pneumothorax.  There may be small pleural effusions.  IMPRESSION: Interval development of right greater than left airspace opacities. The rapid onset favors a process such as asymmetric edema.  Original Report Authenticated By: ANDREW J. DELGAIZO, M.D.   Dg Foot Complete Left  09/18/2011  *RADIOLOGY REPORT*  Clinical Data: Ulcer at plantar aspect left foot with abscess  LEFT FOOT - COMPLETE 3+ VIEW  Comparison: None  Findings: Marked osseous demineralization. Scattered small vessel atherosclerotic calcifications. Joint spaces preserved. Soft tissue gas at great toe. Assessment   of cortical integrity is suboptimal due to degree of bony demineralization. No definite acute fracture, dislocation or bone destruction identified. It is difficult to exclude destruction at the lateral aspect of the first metatarsal head due to degree of demineralization.  IMPRESSION: Soft tissue gas at great toe consistent with infection. No definite acute bony abnormalities  identified, though the head of the first metatarsal is difficult to assess. If osteomyelitis is clinically suspected, or assessment of extent of soft tissue infection is required, recommend MR imaging with and without contrast.  Original Report Authenticated By: MARK A. BOLES, M.D.    PHYSICAL EXAM General:well nourished HEENT:6 cm JVP. Nl carotid upstroke Lungs:clear BS B Heart:RRR, nls S1/s2 Abdomen:soft, non tender Extremity exam:No edema.  Neurologic:Oriented   TELEMETRY: ReviewedNSR  ASSESSMENT AND PLAN:  Principal Problem: 1. NSTEMI- Stable s/p PCI without symptoms of ischemia  Continue ASA, Plavix, beta blocker, statin.   2. Afib- NSR Continue metoprolol to 50mg BID  Would hold off on coumadin until decisions are made about peripheral vascular disease. Can likely start warfarin before discharge. Aspirin can be stopped based on recent data. Plavix should be continued.  3. PVD- Pt with L foot ulcer. The plan is for peripheral arteriogram on Monday by Dr McAlhany. Further treatment (angioplasty vs surgery) depends on findings.  4. 1:2 blood cultures positive for strep veridans- Continue ABx per internal medicine  5. Hypertension- continue current medications. CRI- increase creatinine . Will give IV fluids overnight  Anothy Bufano De Gent,MD 10:20 AM 09/24/2011   

## 2011-09-25 NOTE — Progress Notes (Cosign Needed)
PT/OT Cancellation Note Treatment cancelled today due to patient receiving procedure or test   Treatment cancelled today due to patient's refusal to participate   Treatment cancelled today due to pt going to Cath Lab for angiogram then will be on bedrest until end of the therapy day--x

## 2011-09-25 NOTE — Interval H&P Note (Signed)
History and Physical Interval Note:   09/25/2011   10:52 AM   Todd Cook is here with a NSTEMI. He is now s/p stent to the SVG. He also has a non-healing ulcer left foot plantar surface. 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 Plans for left lower ext runoff today.  The various methods of treatment have been discussed with the patient and family. After consideration of risks, benefits and other options for treatment, the patient has consented to  Procedure(s): LOWER EXTREMITY ANGIOGRAM with visualization of the bilateral iliac arteries .  The patients' history has been reviewed, patient examined, no change in status, stable for surgery.  I have reviewed the patients' chart and labs.  Questions were answered to the patient's satisfaction.     Amandalee Lacap  MD

## 2011-09-25 NOTE — Progress Notes (Signed)
Subjective:  Denies chest pain or dyspnea. Family at bedside wife and daughter. Updated was given. Plan for BKA tomorrow.   Objective: Filed Vitals:   09/25/11 0608 09/25/11 1117 09/25/11 1253 09/25/11 1334  BP: 126/56  141/59 144/61  Pulse: 69 68 64 59  Temp: 99.8 F (37.7 C)  99 F (37.2 C) 98.9 F (37.2 C)  TempSrc: Oral  Oral Oral  Resp: 18  18 18   Height:      Weight: 86.456 kg (190 lb 9.6 oz)     SpO2: 96%  99% 99%   Weight change: 1.95 kg (4 lb 4.8 oz)  Intake/Output Summary (Last 24 hours) at 09/25/11 1805 Last data filed at 09/25/11 1600  Gross per 24 hour  Intake 1509.5 ml  Output   1650 ml  Net -140.5 ml    General: Alert, awake, oriented x3, in no acute distress.  HEENT: No bruits, no goiter.  Heart: Regular rate and rhythm, without murmurs, rubs, gallops.  Lungs: no wheezes, bilateral air movement.  Abdomen: Soft, nontender, nondistended, positive bowel sounds.  Neuro: Grossly intact, nonfocal.   Lab Results:  [facility] 09/25/11 0710 09/24/11 0545  NA 135 135  K 3.4* 4.0  CL 103 101  CO2 23 24  GLUCOSE 80 131*  BUN 29* 28*  CREATININE 1.86* 1.79*  CALCIUM 8.8 9.2  MG -- --  PHOS -- --    Basename 09/25/11 0710 09/24/11 0545  WBC 13.9* 13.9*  NEUTROABS -- --  HGB 10.3* 11.0*  HCT 30.8* 32.4*  MCV 87.5 88.3  PLT 193 173   Micro Results: Recent Results (from the past 240 hour(s))  CULTURE, BLOOD (ROUTINE X 2)     Status: Normal   Collection Time   09/18/11  2:38 PM      Component Value Range Status Comment   Specimen Description BLOOD ARM LEFT   Final    Special Requests BOTTLES DRAWN AEROBIC ONLY 10CC   Final    Setup Time [id number]   Final    Culture     Final    Value: VIRIDANS STREPTOCOCCUS     Note: Gram Stain Report Called to,Read Back By and Verified With: CHRISTIAN TOOENTINO 09/19/11 @ 1822 HAJAM   Report Status 09/21/2011 FINAL   Final   CULTURE, BLOOD (ROUTINE SINGLE)     Status: Normal   Collection Time   09/18/11  4:07 PM       Component Value Range Status Comment   Specimen Description BLOOD ARM RIGHT   Final    Special Requests BOTTLES DRAWN AEROBIC AND ANAEROBIC [facility]   Final    Setup Time [id number]   Final    Culture NO GROWTH 5 DAYS   Final    Report Status 09/24/2011 FINAL   Final   MRSA PCR SCREENING     Status: Normal   Collection Time   09/18/11  8:59 PM      Component Value Range Status Comment   MRSA by PCR NEGATIVE  NEGATIVE  Final   URINE CULTURE     Status: Normal   Collection Time   09/19/11  8:18 AM      Component Value Range Status Comment   Specimen Description URINE, CLEAN CATCH   Final    Special Requests NONE   Final    Setup Time [id number]   Final    Colony Count NO GROWTH   Final    Culture NO GROWTH   Final  Report Status 09/20/2011 FINAL   Final     Studies/Results: Mr Foot Left Wo Contrast  09/24/2011  *RADIOLOGY REPORT*  Clinical Data: Left foot infection with draining wound on the medial aspect of the left foot.  MRI OF THE LEFT FOREFOOT WITHOUT CONTRAST  Technique:  Multiplanar, multisequence MR imaging was performed. No intravenous contrast was administered.  Comparison: Radiographs dated 09/18/2011  Findings: There is extensive pus in and around the first metatarsal phalangeal joint extending into the soft tissues of the distal great toe particularly medially.  This fluid signal intensity extends into the webspace between the first and second toes and extends into the atrophic muscles of the plantar aspect of the foot almost to the base of the second metatarsal.  The fluid extends under the extensor tendon of the second toe and there is fluid in the second metatarsal phalangeal joint. This is probably infected as well.  There is slight edema in the medial and lateral sesamoids adjacent to the head of the first metatarsal and there is subtle edema in the head and shaft of the first metatarsal suggesting osteomyelitis.  Low signal intensity is seen in the soft tissues  of the great toe consistent with gas seen on the radiographs.  IMPRESSION:  1.  Septic first metatarsal phalangeal joint. 2.  Osteomyelitis involving the head and shaft of the first metatarsal. 3.  Pus extending into the soft tissues of the great toe and into the plantar muscles under the first and second metatarsals. 3.  Small effusion at the second metatarsal phalangeal joint with fluid under the extensor tendon which probably represents infection as well.  Original Report Authenticated By: Gwynn Burly, M.D.    Medications: I have reviewed the patient's current medications.   Non-STEMI (non-ST elevated myocardial infarction) - S/P CABG x 4  underwent PTCA and stent placement with a drug-eluting stent to one of his grafted vessels. Cardiology continues to follow.  On ASA/Plavix. Denies chest pain.  A-fib w/ RVR- noted on 11/8 but now sinus rhythm. On Metoprolol 50 mg BID.  He is on ASA and Plavix for MI. Hold off on coumadin until decisions are made about peripheral vascular disease.  Osteomyelitis L foot - Arteriogram on monday. On Zosyn day 7. and Vancomycin. ( started 10-10) . Still with mild fever and leukocytosis , Patient Spike fever again. For KBA 11-13.   1 out of 2 blood cultures positive - gram-positive cocci in pairs and chains  Strep. Viridans on one set of blood cultures- have discussed this w/ Dr Maurice March (ID) who suspects from the history that this is likely a contaminant.  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 At the present time the patient appears to be volume compensated.  No ACE due to renal insufficiency. Medical management.  DM (diabetes mellitus) type I uncontrolled, periph vascular disorder  I will hold lantus and amaryl ,linagilptine episodes of hypoglycemia today and patient for procedure tomorrow.  S/P carotid endarterectomy/peripheral vascular disease  Iron deficiency anemia  This is likely due to renal  insufficiency as well as smoldering osteomyelitis. We will follow the hemoglobin trend.   HTN (hypertension)  Continue Metoprolol and Hydralazine. Chronic Renal insufficiency: Cr stable. Continue to monitor. CR was at 1.8 on July 2005.  Hypokalemia will replete.        LOS: 7 days   REGALADO,BELKYS Pager: [phone] [facility] 09/25/2011, 6:05 PM

## 2011-09-25 NOTE — Progress Notes (Signed)
UR review completed. 

## 2011-09-26 ENCOUNTER — Encounter (HOSPITAL_COMMUNITY)
Admission: EM | Disposition: A | Discharge status: Rehabilitation Facility | Payer: Self-pay | Source: Home / Self Care | Attending: Internal Medicine

## 2011-09-26 ENCOUNTER — Inpatient Hospital Stay (HOSPITAL_COMMUNITY): Payer: Medicare PPO | Admitting: Certified Registered"

## 2011-09-26 ENCOUNTER — Encounter (HOSPITAL_COMMUNITY): Payer: Self-pay | Admitting: Certified Registered"

## 2011-09-26 ENCOUNTER — Encounter (HOSPITAL_COMMUNITY): Payer: Self-pay

## 2011-09-26 ENCOUNTER — Other Ambulatory Visit: Payer: Self-pay | Admitting: Vascular Surgery

## 2011-09-26 DIAGNOSIS — I70269 Atherosclerosis of native arteries of extremities with gangrene, unspecified extremity: Secondary | ICD-10-CM

## 2011-09-26 DIAGNOSIS — I2510 Atherosclerotic heart disease of native coronary artery without angina pectoris: Secondary | ICD-10-CM

## 2011-09-26 HISTORY — PX: AMPUTATION: SHX166

## 2011-09-26 LAB — BASIC METABOLIC PANEL
CO2: 19 mEq/L (ref 19–32)
Calcium: 8.5 mg/dL (ref 8.4–10.5)
Chloride: 102 mEq/L (ref 96–112)
Glucose, Bld: 189 mg/dL — ABNORMAL HIGH (ref 70–99)
Sodium: 133 mEq/L — ABNORMAL LOW (ref 135–145)

## 2011-09-26 LAB — GLUCOSE, CAPILLARY
Glucose-Capillary: 186 mg/dL — ABNORMAL HIGH (ref 70–99)
Glucose-Capillary: 118 mg/dL — ABNORMAL HIGH (ref 70–99)

## 2011-09-26 LAB — ABO/RH: ABO/RH(D): A POS

## 2011-09-26 SURGERY — AMPUTATION BELOW KNEE
Anesthesia: General | Site: Leg Lower | Laterality: Left | Wound class: Clean

## 2011-09-26 MED ORDER — LACTATED RINGERS IV SOLN
INTRAVENOUS | Status: DC | PRN
  Administered 2011-09-26: 16:00:00 via INTRAVENOUS

## 2011-09-26 MED ORDER — FENTANYL CITRATE 0.05 MG/ML IJ SOLN
INTRAMUSCULAR | Status: DC | PRN
  Administered 2011-09-26: 100 ug via INTRAVENOUS
  Administered 2011-09-26: 25 ug via INTRAVENOUS
  Administered 2011-09-26: 50 ug via INTRAVENOUS

## 2011-09-26 MED ORDER — FAMOTIDINE IN NACL 20-0.9 MG/50ML-% IV SOLN
20.0000 mg | Freq: Two times a day (BID) | INTRAVENOUS | Status: AC
  Administered 2011-09-26 – 2011-09-27 (×2): 20 mg via INTRAVENOUS
  Filled 2011-09-26 (×2): qty 50

## 2011-09-26 MED ORDER — POTASSIUM CHLORIDE CRYS ER 20 MEQ PO TBCR: 20.0000 meq | EXTENDED_RELEASE_TABLET | Freq: Once | ORAL | Status: AC | PRN

## 2011-09-26 MED ORDER — PHENOL 1.4 % MT LIQD
1.0000 | OROMUCOSAL | Status: DC | PRN
  Filled 2011-09-26: qty 177

## 2011-09-26 MED ORDER — HYDROMORPHONE HCL PF 1 MG/ML IJ SOLN
0.5000 mg | INTRAMUSCULAR | Status: DC | PRN
  Administered 2011-09-26 – 2011-09-27 (×3): 1 mg via INTRAVENOUS
  Filled 2011-09-26 (×3): qty 1

## 2011-09-26 MED ORDER — PROPOFOL 10 MG/ML IV EMUL
INTRAVENOUS | Status: DC | PRN
  Administered 2011-09-26: 140 mg via INTRAVENOUS

## 2011-09-26 MED ORDER — SODIUM CHLORIDE 0.9 % IJ SOLN
3.0000 mL | INTRAMUSCULAR | Status: DC | PRN
  Administered 2011-09-27: 3 mL via INTRAVENOUS

## 2011-09-26 MED ORDER — ACETAMINOPHEN 325 MG PO TABS: 325.0000 mg | ORAL_TABLET | ORAL | Status: DC | PRN

## 2011-09-26 MED ORDER — POLYETHYLENE GLYCOL 3350 17 G PO PACK
17.0000 g | PACK | Freq: Every day | ORAL | Status: DC | PRN
  Filled 2011-09-26: qty 1

## 2011-09-26 MED ORDER — ENOXAPARIN SODIUM 40 MG/0.4ML ~~LOC~~ SOLN
40.0000 mg | SUBCUTANEOUS | Status: DC
Start: 2011-09-27 — End: 2011-09-29
  Administered 2011-09-27 – 2011-09-29 (×3): 40 mg via SUBCUTANEOUS
  Filled 2011-09-26 (×3): qty 0.4

## 2011-09-26 MED ORDER — DOCUSATE SODIUM 100 MG PO CAPS
100.0000 mg | ORAL_CAPSULE | Freq: Every day | ORAL | Status: DC
  Administered 2011-09-27 – 2011-09-29 (×3): 100 mg via ORAL
  Filled 2011-09-26 (×3): qty 1

## 2011-09-26 MED ORDER — OXYCODONE-ACETAMINOPHEN 5-325 MG PO TABS
1.0000 | ORAL_TABLET | ORAL | Status: DC | PRN
  Administered 2011-09-27 – 2011-09-29 (×3): 2 via ORAL
  Administered 2011-09-29 (×2): 1 via ORAL
  Filled 2011-09-26: qty 2
  Filled 2011-09-26 (×2): qty 1
  Filled 2011-09-26 (×2): qty 2

## 2011-09-26 MED ORDER — ALUM & MAG HYDROXIDE-SIMETH 400-400-40 MG/5ML PO SUSP
15.0000 mL | ORAL | Status: DC | PRN
  Filled 2011-09-26: qty 30

## 2011-09-26 MED ORDER — DEXTROSE 5 % IV SOLN
1.5000 g | Freq: Two times a day (BID) | INTRAVENOUS | Status: DC
  Filled 2011-09-26: qty 1.5

## 2011-09-26 MED ORDER — HYDROMORPHONE HCL PF 1 MG/ML IJ SOLN
0.2500 mg | INTRAMUSCULAR | Status: DC | PRN
  Administered 2011-09-26: 0.5 mg via INTRAVENOUS

## 2011-09-26 MED ORDER — LIDOCAINE HCL (CARDIAC) 20 MG/ML IV SOLN
INTRAVENOUS | Status: DC | PRN
  Administered 2011-09-26: 60 mg via INTRAVENOUS

## 2011-09-26 MED ORDER — LABETALOL HCL 5 MG/ML IV SOLN
10.0000 mg | INTRAVENOUS | Status: DC | PRN
  Filled 2011-09-26: qty 4

## 2011-09-26 MED ORDER — HYDRALAZINE HCL 20 MG/ML IJ SOLN
10.0000 mg | INTRAMUSCULAR | Status: DC | PRN
  Filled 2011-09-26: qty 0.5

## 2011-09-26 MED ORDER — ONDANSETRON HCL 4 MG/2ML IJ SOLN: 4.0000 mg | Freq: Four times a day (QID) | INTRAMUSCULAR | Status: DC | PRN

## 2011-09-26 MED ORDER — METOPROLOL TARTRATE 1 MG/ML IV SOLN: 2.0000 mg | INTRAVENOUS | Status: DC | PRN

## 2011-09-26 MED ORDER — SODIUM CHLORIDE 0.9 % IR SOLN
Status: DC | PRN
  Administered 2011-09-26: 1000 mL

## 2011-09-26 MED ORDER — GUAIFENESIN-DM 100-10 MG/5ML PO SYRP: 15.0000 mL | ORAL_SOLUTION | ORAL | Status: DC | PRN

## 2011-09-26 MED ORDER — BISACODYL 10 MG RE SUPP: 10.0000 mg | Freq: Every day | RECTAL | Status: DC | PRN

## 2011-09-26 MED ORDER — MAGNESIUM SULFATE 50 % IJ SOLN
2.0000 g | Freq: Once | INTRAVENOUS | Status: AC | PRN
  Filled 2011-09-26: qty 4

## 2011-09-26 MED ORDER — ONDANSETRON HCL 4 MG/2ML IJ SOLN
INTRAMUSCULAR | Status: DC | PRN
  Administered 2011-09-26: 4 mg via INTRAVENOUS

## 2011-09-26 MED ORDER — PHENYLEPHRINE HCL 10 MG/ML IJ SOLN
INTRAMUSCULAR | Status: DC | PRN
  Administered 2011-09-26 (×2): 120 ug via INTRAVENOUS
  Administered 2011-09-26 (×2): 80 ug via INTRAVENOUS

## 2011-09-26 MED ORDER — MAGNESIUM HYDROXIDE 400 MG/5ML PO SUSP: 30.0000 mL | ORAL | Status: DC | PRN

## 2011-09-26 MED ORDER — ACETAMINOPHEN 650 MG RE SUPP: 325.0000 mg | RECTAL | Status: DC | PRN

## 2011-09-26 SURGICAL SUPPLY — 55 items
BANDAGE ELASTIC 6 VELCRO ST LF (GAUZE/BANDAGES/DRESSINGS) ×2 IMPLANT
BANDAGE ESMARK 6X9 LF (GAUZE/BANDAGES/DRESSINGS) IMPLANT
BANDAGE GAUZE ELAST BULKY 4 IN (GAUZE/BANDAGES/DRESSINGS) ×2 IMPLANT
BLADE SAW RECIP 87.9 MT (BLADE) ×2 IMPLANT
BNDG COHESIVE 6X5 TAN STRL LF (GAUZE/BANDAGES/DRESSINGS) ×2 IMPLANT
BNDG ESMARK 6X9 LF (GAUZE/BANDAGES/DRESSINGS)
CANISTER SUCTION 2500CC (MISCELLANEOUS) ×2 IMPLANT
CLIP TI MEDIUM 24 (CLIP) ×2 IMPLANT
CLIP TI MEDIUM 6 (CLIP) IMPLANT
CLOTH BEACON ORANGE TIMEOUT ST (SAFETY) ×2 IMPLANT
COVER SURGICAL LIGHT HANDLE (MISCELLANEOUS) ×4 IMPLANT
CUFF TOURNIQUET SINGLE 18IN (TOURNIQUET CUFF) IMPLANT
CUFF TOURNIQUET SINGLE 24IN (TOURNIQUET CUFF) IMPLANT
CUFF TOURNIQUET SINGLE 34IN LL (TOURNIQUET CUFF) IMPLANT
CUFF TOURNIQUET SINGLE 44IN (TOURNIQUET CUFF) IMPLANT
DRAIN CHANNEL 19F RND (DRAIN) IMPLANT
DRAPE ORTHO SPLIT 77X108 STRL (DRAPES) ×2
DRAPE PROXIMA HALF (DRAPES) ×6 IMPLANT
DRAPE SURG ORHT 6 SPLT 77X108 (DRAPES) ×2 IMPLANT
DRSG ADAPTIC 3X8 NADH LF (GAUZE/BANDAGES/DRESSINGS) ×2 IMPLANT
ELECT REM PT RETURN 9FT ADLT (ELECTROSURGICAL) ×2
ELECTRODE REM PT RTRN 9FT ADLT (ELECTROSURGICAL) ×1 IMPLANT
EVACUATOR SILICONE 100CC (DRAIN) IMPLANT
GAUZE KERLIX 2  STERILE LF (GAUZE/BANDAGES/DRESSINGS) ×2 IMPLANT
GLOVE BIO SURGEON STRL SZ 6.5 (GLOVE) ×2 IMPLANT
GLOVE BIO SURGEON STRL SZ7 (GLOVE) ×2 IMPLANT
GLOVE BIO SURGEON STRL SZ7.5 (GLOVE) ×2 IMPLANT
GLOVE BIOGEL PI IND STRL 6.5 (GLOVE) ×2 IMPLANT
GLOVE BIOGEL PI INDICATOR 6.5 (GLOVE) ×2
GOWN PREVENTION PLUS XLARGE (GOWN DISPOSABLE) ×2 IMPLANT
GOWN STRL NON-REIN LRG LVL3 (GOWN DISPOSABLE) ×6 IMPLANT
KIT BASIN OR (CUSTOM PROCEDURE TRAY) ×2 IMPLANT
KIT ROOM TURNOVER OR (KITS) ×2 IMPLANT
NS IRRIG 1000ML POUR BTL (IV SOLUTION) ×4 IMPLANT
PACK GENERAL/GYN (CUSTOM PROCEDURE TRAY) ×2 IMPLANT
PAD ARMBOARD 7.5X6 YLW CONV (MISCELLANEOUS) ×2 IMPLANT
PADDING CAST COTTON 6X4 STRL (CAST SUPPLIES) IMPLANT
SPONGE GAUZE 4X4 12PLY (GAUZE/BANDAGES/DRESSINGS) ×2 IMPLANT
SPONGE GAUZE 4X4 FOR O.R. (GAUZE/BANDAGES/DRESSINGS) ×2 IMPLANT
SPONGE LAP 18X18 X RAY DECT (DISPOSABLE) ×2 IMPLANT
STAPLER VISISTAT 35W (STAPLE) ×2 IMPLANT
STOCKINETTE IMPERVIOUS LG (DRAPES) ×2 IMPLANT
SUT ETHILON 3 0 PS 1 (SUTURE) IMPLANT
SUT SILK 2 0 (SUTURE) ×1
SUT SILK 2 0 SH CR/8 (SUTURE) ×4 IMPLANT
SUT SILK 2-0 18XBRD TIE 12 (SUTURE) ×1 IMPLANT
SUT VIC AB 2-0 CT1 27 (SUTURE) ×2
SUT VIC AB 2-0 CT1 TAPERPNT 27 (SUTURE) ×2 IMPLANT
SUT VIC AB 2-0 SH 18 (SUTURE) ×6 IMPLANT
SUT VIC AB 3-0 SH 27 (SUTURE) ×2
SUT VIC AB 3-0 SH 27X BRD (SUTURE) ×2 IMPLANT
TOWEL OR 17X24 6PK STRL BLUE (TOWEL DISPOSABLE) ×2 IMPLANT
TOWEL OR 17X26 10 PK STRL BLUE (TOWEL DISPOSABLE) ×2 IMPLANT
UNDERPAD 30X30 INCONTINENT (UNDERPADS AND DIAPERS) ×2 IMPLANT
WATER STERILE IRR 1000ML POUR (IV SOLUTION) ×2 IMPLANT

## 2011-09-26 NOTE — Progress Notes (Signed)
Monitor alarms on, name band in place while in PACU

## 2011-09-26 NOTE — Anesthesia Postprocedure Evaluation (Signed)
Anesthesia Post Note  Patient: Todd Cook  Procedure(s) Performed:  AMPUTATION BELOW KNEE  Anesthesia type: General  Patient location: PACU  Post pain: Pain level controlled and Adequate analgesia  Post assessment: Post-op Vital signs reviewed, Patient's Cardiovascular Status Stable, Respiratory Function Stable, Patent Airway and Pain level controlled  Last Vitals:  Filed Vitals:   09/26/11 1800  BP:   Pulse: 71  Temp: 36.4 C  Resp:     Post vital signs: Reviewed and stable  Level of consciousness: awake, alert  and oriented  Complications: No apparent anesthesia complications

## 2011-09-26 NOTE — Progress Notes (Addendum)
Pt currently on vanc and zosyn.  Will dc zinacef.

## 2011-09-26 NOTE — Preoperative (Signed)
Beta Blockers   Reason not to administer Beta Blockers:Not Applicable 

## 2011-09-26 NOTE — Op Note (Addendum)
Procedure: Left below-knee amputation  Preoperative diagnosis: Gangrene left foot  Postoperative diagnosis: Same  Anesthesia Gen.  Assistant: Newton Pigg PA-C  Upper findings: #1 calcified vessels with reasonably well perfused muscle tissue  Operative details: After obtaining informed consent, the patient to the operating room. The patient placed in supine position operating table. After induction general anesthesia and endotracheal ablation patient's entire left lower extremity was prepped and draped all the way down to the level of the ankle. Next a transverse incision was made approximately 4 fingerbreadths below the tibial tuberosity on the left leg.  The  incision was carried posteriorly to the midportion of the leg and then extended longitudinally to create a posterior flap. The subcutaneous tissues and fascia was taken down with cautery. Periosteum was raised on the tibia approximately 5 cm above the skin edge.  The periosteum was also raised on the fibula several centimeters above this. The tibia was then divided with a saw. The fibula was divided with a bone cutter. The leg was then elevated in the operative field and the amputation was completed posterior to the bones with an amputation knife. Hemostasis was then obtained with cautery and several suture ligatures. The tibial and sural nerves were pulled down into the field transected and allowed to retract up into the leg.  After hemostasis was obtained, the wound was thoroughly irrigated with  normal saline solution. The fascial edges were then reapproximated with interrupted 2-0 Vicryl sutures. Subcutaneous tissues were reapproximated using running 3-0 Vicryl suture. The skin was closed with staples. The patient tolerated the procedure well and there were no complications. Instrument sponge and  needle counts were correct at the end of the case. The patient was taken to the recovery room in stable condition.  Fabienne Bruns,  MD [facility] Office: [phone] Pager: [phone]

## 2011-09-26 NOTE — Progress Notes (Signed)
Patient lives with spouse, admitted with NSTEMI and ischemic foot, patient for BKA today.

## 2011-09-26 NOTE — Progress Notes (Signed)
  Pt. With extensive infection in left foot.  Agram shows very diseased peroneal runoff.  He is not a candidate for surgical bypass at this point in light of recent MI.  D/w pt and family that best option is left BKA.  We have scheduled this today. The patient has been re-examined.  The patient's history and physical has been reviewed and is unchanged.  There is no change in the plan of care.   Risks benefits and possible complication of left below knee amputation were discussed in depth with the patient including but not limited to bleeding, infection, myocardial infarction, death, failure to heal amputation wound, and possible need for more proximal amputation.  The patient is aware of the risks and agrees proceed forward with the procedure.  Fabienne Bruns, MD

## 2011-09-26 NOTE — Transfer of Care (Signed)
Immediate Anesthesia Transfer of Care Note  Patient: Todd Cook  Procedure(s) Performed:  AMPUTATION BELOW KNEE  Patient Location: PACU  Anesthesia Type: General  Level of Consciousness: awake and alert   Airway & Oxygen Therapy: Patient Spontanous Breathing and Patient connected to nasal cannula oxygen  Post-op Assessment: Report given to PACU RN and Post -op Vital signs reviewed and stable  Post vital signs: Reviewed and stable  Complications: No apparent anesthesia complications

## 2011-09-26 NOTE — Progress Notes (Signed)
Subjective:  70 year old with PMH CKD cr base line 1.8, Diabetes, PVD admitted (11-5) with chest pain and ulcer left great toe (started OCT 16) . Please for further detail refer to HPI done 11-5.   Hospital Course He had Non STEMI.  He underwent PTCA and stent placement with a drug-eluting stent to one of his grafted vessels (11-07). He had ABI  as an outpatient and this showed significant stenosis of the left lower extremity at level distal popliteal artery, posterior tibial artery indicates a severe reduction in arterial flow. Patient had MRI on (11-11) that showed Septic first metatarsal phalangeal joint. Osteomyelitis involving the head and shaft of the first metatarsal. Pus extending into the soft tissues of the great toe and into the plantar muscles under the first and second metatarsals. Patient continue to spike fevers and increasing WBC.   Patient is s/p distal aortogram with left lower ext runoff on 11-12. No good options for percutaneous revascularization. No candidate for surgical bypass in light of recent MI. For BKA today(11-13)    Patient denies Chest pain or Dyspnea.      Objective: Filed Vitals:   09/25/11 1334 09/25/11 2047 09/26/11 0538 09/26/11 1339  BP: 144/61 147/68 143/72 124/73  Pulse: 59 75 64 58  Temp: 98.9 F (37.2 C) 97.7 F (36.5 C) 98.1 F (36.7 C) 98.5 F (36.9 C)  TempSrc: Oral Oral Oral Oral  Resp: 18 18 18 19   Height:      Weight:   83.8 kg (184 lb 11.9 oz)   SpO2: 99% 94% 95% 96%   Weight change: -2.656 kg (-5 lb 13.7 oz)  Intake/Output Summary (Last 24 hours) at 09/26/11 1403 Last data filed at 09/26/11 1300  Gross per 24 hour  Intake 1539.5 ml  Output   1750 ml  Net -210.5 ml    General: Alert, awake, oriented x3, in no acute distress.  HEENT: No bruits, no goiter.  Heart: Regular rate and rhythm, without murmurs, rubs, gallops.  Lungs: , bilateral air movement, no wheezes or ronchy.  Abdomen: Soft, nontender, nondistended, positive  bowel sounds.  Neuro: Grossly intact, nonfocal. Extremities: left foot with dressing, discoloration left great toe.    Lab Results:  [facility] 09/26/11 0645 09/25/11 0710  NA 133* 135  K 3.8 3.4*  CL 102 103  CO2 19 23  GLUCOSE 189* 80  BUN 28* 29*  CREATININE 1.80* 1.86*  CALCIUM 8.5 8.8  MG -- --  PHOS -- --    Basename 09/25/11 0710 09/24/11 0545  WBC 13.9* 13.9*  NEUTROABS -- --  HGB 10.3* 11.0*  HCT 30.8* 32.4*  MCV 87.5 88.3  PLT 193 173    Micro Results: Recent Results (from the past 240 hour(s))  CULTURE, BLOOD (ROUTINE X 2)     Status: Normal   Collection Time   09/18/11  2:38 PM      Component Value Range Status Comment   Specimen Description BLOOD ARM LEFT   Final    Special Requests BOTTLES DRAWN AEROBIC ONLY 10CC   Final    Setup Time [id number]   Final    Culture     Final    Value: VIRIDANS STREPTOCOCCUS     Note: Gram Stain Report Called to,Read Back By and Verified With: CHRISTIAN TOOENTINO 09/19/11 @ 1822 HAJAM   Report Status 09/21/2011 FINAL   Final   CULTURE, BLOOD (ROUTINE SINGLE)     Status: Normal   Collection Time   09/18/11  4:07 PM      Component Value Range Status Comment   Specimen Description BLOOD ARM RIGHT   Final    Special Requests BOTTLES DRAWN AEROBIC AND ANAEROBIC [facility]   Final    Setup Time [id number]   Final    Culture NO GROWTH 5 DAYS   Final    Report Status 09/24/2011 FINAL   Final   MRSA PCR SCREENING     Status: Normal   Collection Time   09/18/11  8:59 PM      Component Value Range Status Comment   MRSA by PCR NEGATIVE  NEGATIVE  Final   URINE CULTURE     Status: Normal   Collection Time   09/19/11  8:18 AM      Component Value Range Status Comment   Specimen Description URINE, CLEAN CATCH   Final    Special Requests NONE   Final    Setup Time [id number]   Final    Colony Count NO GROWTH   Final    Culture NO GROWTH   Final    Report Status 09/20/2011 FINAL   Final   SURGICAL PCR SCREEN     Status:  Normal   Collection Time   09/25/11  5:01 PM      Component Value Range Status Comment   MRSA, PCR NEGATIVE  NEGATIVE  Final    Staphylococcus aureus NEGATIVE  NEGATIVE  Final     Medications: I have reviewed the patient's current medications.   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 Cardiology continues to follow.  On ASA/Plavix. Denies chest pain. Monitor closely in the post operative period.   A-fib w/ RVR- noted on 11/8 but now sinus rhythm. On Metoprolol 50 mg BID.  He is on ASA and Plavix for MI. Hold off on coumadin until surgery. Follow up with vascular and Cardio when to start coumadin. If patient is started on coumadin, please consider discontinue aspirin.   Osteomyelitis L foot, Severe PVD - Post Arteriogram 11-12 . On Zosyn day 8. and Vancomycin. (started 10-10) . Still with mild fever and leukocytosis . For KBA 11-13.   1 out of 2 blood cultures positive - gram-positive cocci in pairs and chains  Strep. Viridans on one set of blood cultures- Dr Butler Denmark  discussed this w/ Dr Maurice March (ID) who suspects from the history that this is likely a contaminant. I will repeat blood culture 11-14.  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 At the present time the patient appears to be volume compensated.  No ACE due to renal insufficiency. Medical management.  DM (diabetes mellitus) , periph vascular disorder  I will hold lantus and amaryl ,linagilptine episodes of hypoglycemia on 11-12.  Patient NPO.   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 We will follow the hemoglobin trend.  HTN (hypertension)  Continue Metoprolol and Hydralazine.  Chronic Renal  insufficiency: Cr stable. Continue to monitor. CR was at 1.8 on July 2005. Repeat 11-14. Hypokalemia will repleted. Hyponatremia: Mild monitor.     LOS: 8 days   Rosmary Dionisio M.D. Pager: [phone] [facility] 09/26/2011, 2:03 PM

## 2011-09-26 NOTE — Anesthesia Postprocedure Evaluation (Signed)
  Anesthesia Post-op Note  Patient: Todd Cook  Procedure(s) Performed:  AMPUTATION BELOW KNEE  Patient Location: PACU  Anesthesia Type: General  Level of Consciousness: alert   Airway and Oxygen Therapy: Patient connected to nasal cannula oxygen  Post-op Pain: none  Post-op Assessment: Post-op Vital signs reviewed  Post-op Vital Signs: stable  Complications: No apparent anesthesia complications

## 2011-09-26 NOTE — Anesthesia Procedure Notes (Signed)
Procedure Name: LMA Insertion Date/Time: 09/26/2011 4:16 PM Performed by: Jefm Miles E Pre-anesthesia Checklist: Patient identified, Emergency Drugs available, Suction available, Patient being monitored and Timeout performed Patient Re-evaluated:Patient Re-evaluated prior to inductionOxygen Delivery Method: Circle System Utilized Preoxygenation: Pre-oxygenation with 100% oxygen Intubation Type: IV induction Ventilation: Mask ventilation without difficulty LMA: LMA inserted LMA Size: 4.0 Number of attempts: 1 Placement Confirmation: breath sounds checked- equal and bilateral,  positive ETCO2 and CO2 detector Tube secured with: Tape Dental Injury: Teeth and Oropharynx as per pre-operative assessment

## 2011-09-26 NOTE — Progress Notes (Signed)
PT Cancellation Note  Treatment cancelled today due to patient pending surgery for below knee amputation. Please reorder post-operatively.  09/26/2011 Edwyna Perfect, PT  Pager [phone]

## 2011-09-26 NOTE — Anesthesia Preprocedure Evaluation (Addendum)
Anesthesia Evaluation  Patient identified by MRN, date of birth, ID band Patient awake    Reviewed: Allergy & Precautions, H&P , NPO status , Patient's Chart, lab work & pertinent test results  Airway Mallampati: I TM Distance: >3 FB Neck ROM: Full    Dental  (+) Teeth Intact and Dental Advisory Given   Pulmonary shortness of breath and with exertion,          Cardiovascular hypertension, Pt. on medications + angina with exertion + CAD and + Past MI     Neuro/Psych    GI/Hepatic   Endo/Other  Diabetes mellitus-, Type 2, Oral Hypoglycemic Agents  Renal/GU      Musculoskeletal   Abdominal   Peds  Hematology   Anesthesia Other Findings   Reproductive/Obstetrics                          Anesthesia Physical Anesthesia Plan  ASA: III  Anesthesia Plan: General   Post-op Pain Management:    Induction: Intravenous  Airway Management Planned: LMA  Additional Equipment:   Intra-op Plan:   Post-operative Plan: Extubation in OR  Informed Consent: I have reviewed the patients History and Physical, chart, labs and discussed the procedure including the risks, benefits and alternatives for the proposed anesthesia with the patient or authorized representative who has indicated his/her understanding and acceptance.     Plan Discussed with: CRNA and Surgeon  Anesthesia Plan Comments:         Anesthesia Quick Evaluation

## 2011-09-26 NOTE — Progress Notes (Signed)
SUBJECTIVE: No chest pain or SOB.   BP 143/72  Pulse 64  Temp(Src) 98.1 F (36.7 C) (Oral)  Resp 18  Ht 5\' 10"  (1.778 m)  Wt 184 lb 11.9 oz (83.8 kg)  BMI 26.51 kg/m2  SpO2 95%  Intake/Output Summary (Last 24 hours) at 09/26/11 0750 Last data filed at 09/26/11 0543  Gross per 24 hour  Intake 1509.5 ml  Output   1750 ml  Net -240.5 ml    PHYSICAL EXAM General: Well developed, well nourished, in no acute distress. Alert and oriented x 3.  Psych:  Good affect, responds appropriately Neck: No JVD. No masses noted.  Lungs: Clear bilaterally with no wheezes or rhonci noted.  Heart: Irregular with no murmurs noted. Abdomen: Bowel sounds are present. Soft, non-tender.  Extremities: No lower extremity edema. Right groin cath site ok.   LABS: Basic Metabolic Panel:  Basename 09/26/11 0645 09/25/11 0710  NA 133* 135  K 3.8 3.4*  CL 102 103  CO2 19 23  GLUCOSE 189* 80  BUN 28* 29*  CREATININE 1.80* 1.86*  CALCIUM 8.5 8.8  MG -- --  PHOS -- --   CBC:  Basename 09/25/11 0710 09/24/11 0545  WBC 13.9* 13.9*  NEUTROABS -- --  HGB 10.3* 11.0*  HCT 30.8* 32.4*  MCV 87.5 88.3  PLT 193 173    Current Meds:    . allopurinol  300 mg Oral Daily  . aspirin EC  325 mg Oral Daily  . clopidogrel  75 mg Oral Q breakfast  . heparin      . hydrALAZINE  10 mg Oral Q8H  . insulin aspart  0-20 Units Subcutaneous TID WC  . insulin aspart  0-5 Units Subcutaneous QHS  . living well with diabetes book   Does not apply Once  . metoprolol tartrate  50 mg Oral BID  . midazolam      . piperacillin-tazobactam (ZOSYN)  IV  3.375 g Intravenous Q8H  . potassium chloride  20 mEq Oral Once  . rosuvastatin  10 mg Oral Daily  . sodium chloride  3 mL Intravenous Q12H  . vancomycin  1,500 mg Intravenous Q24H  . DISCONTD: glimepiride  4 mg Oral BID AC  . DISCONTD: insulin aspart  3 Units Subcutaneous TID WC  . DISCONTD: insulin glargine  14 Units Subcutaneous QHS  . DISCONTD: linagliptin  5 mg  Oral Daily     ASSESSMENT AND PLAN:  1. CAD: Admitted with NSTEMI. Now s/p PCI and doing well. Continue current cardiac therapy including ASA and Plavix.  2. Atrial fibrillation: In NSR today. NO coumadin until definitive plans for surgery have been made.   3. Left foot wound: s/p distal aortogram with left lower ext runoff yesterday. No good options for percutaneous revascularization. 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  Case reviewed yesterday with Dr. Darrick Penna from Vascular Surgery. Surgical revascularization would likely not be possible.  Dr. Darrick Penna is following and per family, possible amputation today of left lower ext below knee.    4. Ischemic CM: Medical mgmt. He is on a beta blocker. Not on an Ace-inhibitor secondary to renal insufficiency.   4. CRI: Stable post cath.   Todd Cook,Todd Cook  11/13/20127:50 AM

## 2011-09-26 NOTE — Progress Notes (Signed)
OT Cancellation Note: Evaluation cancelled today due to patient pending surgery for BKA. Please reorder therapy services after procedure. Thanks! Cassandria Anger, OTR/L Pager: [phone] 09/26/2011 .

## 2011-09-27 DIAGNOSIS — S88119A Complete traumatic amputation at level between knee and ankle, unspecified lower leg, initial encounter: Secondary | ICD-10-CM

## 2011-09-27 DIAGNOSIS — E119 Type 2 diabetes mellitus without complications: Secondary | ICD-10-CM

## 2011-09-27 DIAGNOSIS — I739 Peripheral vascular disease, unspecified: Secondary | ICD-10-CM

## 2011-09-27 DIAGNOSIS — L98499 Non-pressure chronic ulcer of skin of other sites with unspecified severity: Secondary | ICD-10-CM

## 2011-09-27 LAB — GLUCOSE, CAPILLARY
Glucose-Capillary: 298 mg/dL — ABNORMAL HIGH (ref 70–99)
Glucose-Capillary: 312 mg/dL — ABNORMAL HIGH (ref 70–99)

## 2011-09-27 LAB — CBC
MCH: 29.2 pg (ref 26.0–34.0)
MCHC: 33.1 g/dL (ref 30.0–36.0)
MCV: 88.3 fL (ref 78.0–100.0)
Platelets: 279 10*3/uL (ref 150–400)
Platelets: 283 10*3/uL (ref 150–400)
RBC: 3.01 MIL/uL — ABNORMAL LOW (ref 4.22–5.81)
RDW: 13.9 % (ref 11.5–15.5)
RDW: 13.9 % (ref 11.5–15.5)
WBC: 18.2 10*3/uL — ABNORMAL HIGH (ref 4.0–10.5)

## 2011-09-27 LAB — CREATININE, SERUM
Creatinine, Ser: 1.98 mg/dL — ABNORMAL HIGH (ref 0.50–1.35)
GFR calc Af Amer: 37 mL/min — ABNORMAL LOW (ref >90)

## 2011-09-27 LAB — BASIC METABOLIC PANEL
CO2: 17 mEq/L — ABNORMAL LOW (ref 19–32)
Calcium: 8 mg/dL — ABNORMAL LOW (ref 8.4–10.5)
Chloride: 98 mEq/L (ref 96–112)
Creatinine, Ser: 2.15 mg/dL — ABNORMAL HIGH (ref 0.50–1.35)
Creatinine, Ser: 2.2 mg/dL — ABNORMAL HIGH (ref 0.50–1.35)
GFR calc Af Amer: 34 mL/min — ABNORMAL LOW (ref >90)
GFR calc Af Amer: 33 mL/min — ABNORMAL LOW (ref >90)
Potassium: 4.3 mEq/L (ref 3.5–5.1)

## 2011-09-27 MED ORDER — ASPIRIN EC 81 MG PO TBEC
81.0000 mg | DELAYED_RELEASE_TABLET | Freq: Every day | ORAL | Status: DC
  Administered 2011-09-27 – 2011-09-29 (×3): 81 mg via ORAL
  Filled 2011-09-27 (×3): qty 1

## 2011-09-27 MED ORDER — LINAGLIPTIN 5 MG PO TABS
5.0000 mg | ORAL_TABLET | Freq: Every day | ORAL | Status: DC
  Administered 2011-09-27 – 2011-09-29 (×3): 5 mg via ORAL
  Filled 2011-09-27 (×3): qty 1

## 2011-09-27 MED ORDER — FAMOTIDINE 20 MG PO TABS
20.0000 mg | ORAL_TABLET | Freq: Every day | ORAL | Status: DC
  Administered 2011-09-28 – 2011-09-29 (×2): 20 mg via ORAL
  Filled 2011-09-27 (×2): qty 1

## 2011-09-27 MED ORDER — HYDRALAZINE HCL 10 MG PO TABS
10.0000 mg | ORAL_TABLET | Freq: Two times a day (BID) | ORAL | Status: DC
  Administered 2011-09-28 – 2011-09-29 (×3): 10 mg via ORAL
  Filled 2011-09-27 (×5): qty 1

## 2011-09-27 MED ORDER — INSULIN GLARGINE 100 UNIT/ML ~~LOC~~ SOLN
5.0000 [IU] | Freq: Every day | SUBCUTANEOUS | Status: DC
  Filled 2011-09-27: qty 3

## 2011-09-27 MED ORDER — GLIMEPIRIDE 2 MG PO TABS
2.0000 mg | ORAL_TABLET | Freq: Every day | ORAL | Status: DC
  Administered 2011-09-28: 2 mg via ORAL
  Filled 2011-09-27 (×2): qty 1

## 2011-09-27 MED ORDER — INSULIN GLARGINE 100 UNIT/ML ~~LOC~~ SOLN
10.0000 [IU] | Freq: Every day | SUBCUTANEOUS | Status: DC
  Administered 2011-09-27: 10 [IU] via SUBCUTANEOUS
  Filled 2011-09-27: qty 3

## 2011-09-27 NOTE — PMR Pre-admission (Signed)
PMR Admission Coordinator Pre-Admission Assessment  Patient:  Todd Cook is an 70 y.o., male MRN:  [id number] DOB:  [birth year] Height:  Height: 5\' 10"  (177.8 cm) Weight:  Weight: 86.4 kg (190 lb 7.6 oz) (bed weight)  SS# [id number]  Insurance Information:  HMO:yes    PPO:     PCP:     IPA:     80/20:     OTHER:POS Medicare advantage plan PRIMARY:[employer] medicare      Policy#:[id number]      Subscriber:pt CM Name: Venita Sheffield      Phone#: [phone]. [phone]     QIH#:[phone] Pre-Cert#:[id number]      Employer:retired Benefits:  Phone #:[phone] 09/27/11     Name:Selene Eff. Date:11/13/10     Deduct:none      Out of Pocket Max:$3400      Life OAC:ZYSA CIR:$195/day days 1 thru 6      SNF:no copay days 1 thru 7, $50/day days 8 thru 100 Outpatient:$10 / visit     Co-Pay:  Home Health:100%      Co-Pay:  DME:80 %     Co-Pay:20% Providers:in network  Current Medical History:   Patient Admitting Diagnosis:  Left BKA  History of Present Illness:  Admitted 11/5 with chest pain due to NSTEMI.  Underwent PTCA 09/20/11. Underwent aortogram with severe PVD and L BKA 09/26/11.  PCP had been treating patient pta ulcers left foot. Felt not to be a candidate for surgical bypass therefore BKA.  Patients Past Medical History:   Past Medical History  Diagnosis Date  . Blood transfusion   . Angina   . Chronic kidney disease   . Anemia   . Diabetes mellitus   . Peripheral vascular disease   . Hypertension   . Coronary artery disease   . Myocardial infarction   . Shortness of breath   . Gout     left foot   Family Medical History:  Height and Weight Height: 5\' 10"  (177.8 cm) Weight: 86.4 kg (190 lb 7.6 oz) (bed weight) Type of Weight: Actual BSA (Calculated - sq m): 2.2 sq meters BMI (Calculated): 31.2  Weight in (lb) to have BMI = 25: 173.9       Prior Rehab/Hospitalizations: none  Medications PTA Medications:   Medications Prior to Admission  Medication Dose Route  Frequency Provider Last Rate Last Dose  . 0.45 % sodium chloride infusion   Intravenous Continuous Amelia Jo [place], [place] 15 mL/hr at 09/28/11 1638 15 mL/hr at 09/28/11 1638  . 0.9 %  sodium chloride infusion  1,000 mL Intravenous STAT Hurman Horn, MD      . 0.9 %  sodium chloride infusion  1 mL/kg/hr Intravenous Continuous Verne Carrow, MD 86.5 mL/hr at 09/25/11 1300 1 mL/kg/hr at 09/25/11 1300  . acetaminophen (TYLENOL) tablet 325-650 mg  325-650 mg Oral Q4H PRN Marlowe Shores, PA       Or  . acetaminophen (TYLENOL) suppository 325-650 mg  325-650 mg Rectal Q4H PRN Marlowe Shores, PA      . acetaminophen (TYLENOL) tablet 650 mg  650 mg Oral Q4H PRN Pleas Koch, MD   650 mg at 09/26/11 2327  . acetaminophen (TYLENOL) tablet 650 mg  650 mg Oral Once Hind I. Elsaid      . allopurinol (ZYLOPRIM) tablet 300 mg  300 mg Oral Daily Saima Rizwan   300 mg at 09/29/11 1038  . ALPRAZolam Prudy Feeler) tablet 0.25 mg  0.25 mg Oral BID PRN Pleas Koch,  MD   0.25 mg at 09/28/11 2302  . alum & mag hydroxide-simeth (MAALOX PLUS) 400-400-40 MG/5ML suspension 15-30 mL  15-30 mL Oral Q2H PRN Marlowe Shores, PA      . aspirin 81 MG chewable tablet        324 mg at 09/18/11 1455  . aspirin chewable tablet 324 mg  324 mg Oral NOW Pleas Koch, MD      . aspirin EC tablet 81 mg  81 mg Oral Daily Verne Carrow, MD   81 mg at 09/29/11 1039  . bisacodyl (DULCOLAX) suppository 10 mg  10 mg Rectal Daily PRN Marlowe Shores, PA      . clopidogrel (PLAVIX) 300 MG tablet           . clopidogrel (PLAVIX) tablet 75 mg  75 mg Oral Q breakfast Pleas Koch, MD   75 mg at 09/29/11 0810  . dextrose 50 % solution 25 mL  25 mL Intravenous PRN Pleas Koch, MD      . diphenhydrAMINE (BENADRYL) capsule 25 mg  25 mg Oral Once Hind I. Elsaid   25 mg at 09/28/11 1605  . docusate sodium (COLACE) capsule 100 mg  100 mg Oral Daily Marlowe Shores, PA   100 mg at 09/29/11 1039  . enoxaparin (LOVENOX) injection 40 mg  40  mg Subcutaneous Q24H Amelia Jo [place], [place]   40 mg at 09/29/11 1039  . famotidine (PEPCID) IVPB 20 mg  20 mg Intravenous Q12H Dannielle Huh, PHARMD   20 mg at 09/27/11 1027  . famotidine (PEPCID) tablet 20 mg  20 mg Oral Daily Dannielle Huh, PHARMD   20 mg at 09/29/11 1040  . fentaNYL (SUBLIMAZE) 0.05 MG/ML injection           . furosemide (LASIX) injection 20 mg  20 mg Intravenous Once Hind I. Elsaid   20 mg at 09/28/11 1640  . furosemide (LASIX) injection 40 mg  40 mg Intravenous Once Dolores Patty, MD   40 mg at 09/20/11 0425  . glimepiride (AMARYL) tablet 2 mg  2 mg Oral BID Hind I. Elsaid   2 mg at 09/29/11 1040  . guaiFENesin-dextromethorphan (ROBITUSSIN DM) 100-10 MG/5ML syrup 15 mL  15 mL Oral Q4H PRN Amelia Jo Roczniak, PA      . heparin 100 units/mL bolus via infusion 1,500 Units  1,500 Units Intravenous Once Gala Lewandowsky [place], PHARMD   1,500 Units at 09/19/11 2115  . heparin 100 units/mL bolus via infusion 3,000 Units  3,000 Units Intravenous Once Severiano Gilbert, PHARMD   3,000 Units at 09/19/11 1200  . heparin 2-0.9 UNIT/ML-% infusion           . hydrALAZINE (APRESOLINE) injection 10 mg  10 mg Intravenous Q2H PRN Marlowe Shores, PA      . hydrALAZINE (APRESOLINE) tablet 10 mg  10 mg Oral BID Hind I. Elsaid   10 mg at 09/29/11 1041  . HYDROmorphone (DILAUDID) injection 0.5-1 mg  0.5-1 mg Intravenous Q2H PRN Marlowe Shores, PA   1 mg at 09/27/11 0416  . insulin aspart (novoLOG) injection 0-20 Units  0-20 Units Subcutaneous TID WC Jeffrey T McClung   11 Units at 09/29/11 1206  . insulin aspart (novoLOG) injection 0-5 Units  0-5 Units Subcutaneous QHS Elpidio Eric McClung   4 Units at 09/27/11 2206  . insulin glargine (LANTUS) injection 5 Units  5 Units Subcutaneous QHS Hind I. Elsaid   5 Units at  09/28/11 2124  . labetalol (NORMODYNE,TRANDATE) injection 10 mg  10 mg Intravenous Q2H PRN Amelia Jo Roczniak, PA      . lidocaine (XYLOCAINE) 1 % injection             . lidocaine (XYLOCAINE) 1 % injection           . lidocaine (XYLOCAINE) 1 % injection           . linagliptin (TRADJENTA) tablet 5 mg  5 mg Oral Daily Hind I. Elsaid   5 mg at 09/29/11 1041  . living well with diabetes book MISC   Does not apply Once Lonia Blood      . magnesium hydroxide (MILK OF MAGNESIA) suspension 30 mL  30 mL Oral PRN Amelia Jo Roczniak, PA      . magnesium sulfate 2 g in dextrose 5 % 50 mL IVPB  2 g Intravenous Once PRN Marlowe Shores, PA      . metoprolol (LOPRESSOR) injection 2-5 mg  2-5 mg Intravenous Q2H PRN Marlowe Shores, PA      . metoprolol (LOPRESSOR) injection 5 mg  5 mg Intravenous NOW Michael E. Hall   5 mg at 09/19/11 0359  . metoprolol (LOPRESSOR) tablet 50 mg  50 mg Oral BID Gardiner Rhyme, MD   50 mg at 09/29/11 1042  . metoprolol tartrate (LOPRESSOR) tablet 25 mg  25 mg Oral Once Colleen Can, PHARMD   25 mg at 09/23/11 0030  . midazolam (VERSED) 2 MG/2ML injection           . midazolam (VERSED) 2 MG/2ML injection           . nitroGLYCERIN (NITROSTAT) SL tablet 0.4 mg  0.4 mg Sublingual Q5 min PRN Pleas Koch, MD      . nitroGLYCERIN (NTG ON-CALL) 0.2 mg/mL injection           . ondansetron (ZOFRAN) 4 MG/2ML injection           . ondansetron (ZOFRAN) injection 4 mg  4 mg Intravenous Q6H PRN Pleas Koch, MD      . oxyCODONE-acetaminophen (PERCOCET) 5-325 MG per tablet 1-2 tablet  1-2 tablet Oral Q4H PRN Marlowe Shores, PA   1 tablet at 09/29/11 1450  . phenol (CHLORASEPTIC) mouth spray 1 spray  1 spray Mouth/Throat PRN Marlowe Shores, PA      . piperacillin-tazobactam (ZOSYN) IVPB 3.375 g  3.375 g Intravenous Once Hurman Horn, MD   3.375 g at 09/22/11 1400  . polyethylene glycol (MIRALAX / GLYCOLAX) packet 17 g  17 g Oral Daily PRN Amelia Jo Roczniak, PA      . potassium chloride SA (K-DUR,KLOR-CON) CR tablet 20 mEq  20 mEq Oral Once Hartley Barefoot, MD   20 mEq at 09/26/11 0624  . potassium chloride SA (K-DUR,KLOR-CON) CR tablet  20 mEq  20 mEq Oral Once Hind I. Elsaid   20 mEq at 09/28/11 1640  . potassium chloride SA (K-DUR,KLOR-CON) CR tablet 20-40 mEq  20-40 mEq Oral Once PRN Marlowe Shores, PA      . rosuvastatin (CRESTOR) tablet 10 mg  10 mg Oral Daily Pleas Koch, MD   10 mg at 09/29/11 1042  . sodium chloride 0.9 % bolus 2,000 mL  2,000 mL Intravenous Once Hurman Horn, MD   2,000 mL at 09/18/11 1709  . sodium chloride 0.9 % bolus 500 mL  500 mL Intravenous Once Hurman Horn, MD  500 mL at 09/18/11 1445  . sodium chloride 0.9 % injection 3 mL  3 mL Intravenous Q12H Verne Carrow, MD   3 mL at 09/29/11 1000  . sodium chloride 0.9 % injection 3 mL  3 mL Intravenous PRN Verne Carrow, MD      . sodium chloride 0.9 % injection 3 mL  3 mL Intravenous PRN Marlowe Shores, PA   3 mL at 09/27/11 1015  . DISCONTD: 0.9 %  sodium chloride infusion  1,000 mL Intravenous STAT Hurman Horn, MD      . DISCONTD: 0.9 %  sodium chloride infusion  250 mL Intravenous Continuous Pleas Koch, MD      . DISCONTD: 0.9 %  sodium chloride infusion   Intravenous Continuous Pleas Koch, MD 125 mL/hr at 09/19/11 2100    . DISCONTD: 0.9 %  sodium chloride infusion  250 mL Intravenous Continuous Verne Carrow, MD      . DISCONTD: 0.9 %  sodium chloride infusion  1 mL/kg/hr Intravenous Continuous Verne Carrow, MD 98.9 mL/hr at 09/20/11 0355 1 mL/kg/hr at 09/20/11 0355  . DISCONTD: 0.9 %  sodium chloride infusion   Intravenous Continuous Iran Ouch, MD      . DISCONTD: 0.9 %  sodium chloride infusion   Intravenous Continuous Elpidio Eric McClung 20 mL/hr at 09/23/11 2322    . DISCONTD: aspirin 81 MG chewable tablet           . DISCONTD: aspirin EC tablet 325 mg  325 mg Oral Daily Marcelino Scot, PHARMD   325 mg at 09/26/11 7829  . DISCONTD: aspirin suppository 300 mg  300 mg Rectal NOW Pleas Koch, MD      . DISCONTD: aspirin tablet 325 mg  325 mg Oral Daily Hurman Horn, MD   325 mg at 09/23/11 1040  .  DISCONTD: cefUROXime (ZINACEF) 1.5 g in dextrose 5 % 50 mL IVPB  1.5 g Intravenous Q12H Amelia Jo [place], [place]      . DISCONTD: dextrose 5 %-0.45 % sodium chloride infusion   Intravenous Continuous Pleas Koch, MD 125 mL/hr at 09/19/11 0303    . DISCONTD: furosemide (LASIX) injection 20 mg  20 mg Intravenous Once Hind I. Elsaid      . DISCONTD: glimepiride (AMARYL) tablet 2 mg  2 mg Oral Q breakfast Hind I. Elsaid   2 mg at 09/28/11 0604  . DISCONTD: glimepiride (AMARYL) tablet 4 mg  4 mg Oral BID AC Saima Rizwan   4 mg at 09/25/11 1732  . DISCONTD: heparin 100 units/mL bolus via infusion 4,000 Units  4,000 Units Intravenous Once Christian M Buettner, [place]      . DISCONTD: heparin 2-0.9 UNIT/ML-% infusion        19 mL/hr at 09/20/11 1345  . DISCONTD: heparin ADULT infusion 100 units/mL (25000 units/250 mL)  14 Units/kg/hr Intravenous Continuous Christian M Buettner, [place]      . DISCONTD: heparin ADULT infusion 100 units/mL (25000 units/250 mL)  1,900 Units/hr Intravenous Continuous Severiano Gilbert, PHARMD 19 mL/hr at 09/20/11 1400 1,900 Units/hr at 09/20/11 1400  . DISCONTD: hydrALAZINE (APRESOLINE) injection 10 mg  10 mg Intravenous Once Rise Patience, [place]      . DISCONTD: hydrALAZINE (APRESOLINE) tablet 10 mg  10 mg Oral Q8H Belkys Regalado, MD   10 mg at 09/27/11 1424  . DISCONTD: HYDROmorphone (DILAUDID) injection 0.25-0.5 mg  0.25-0.5 mg Intravenous Q5 min PRN Raiford Simmonds, MD   0.5 mg at  09/26/11 1947  . DISCONTD: insulin aspart (novoLOG) injection 0-9 Units  0-9 Units Subcutaneous Q4H Pleas Koch, MD   3 Units at 09/20/11 1233  . DISCONTD: insulin aspart (novoLOG) injection 10 Units  10 Units Subcutaneous Once Christian M Buettner, PHARMD      . DISCONTD: insulin aspart (novoLOG) injection 12 Units  12 Units Subcutaneous Once Hurman Horn, MD      . DISCONTD: insulin aspart (novoLOG) injection 3 Units  3 Units Subcutaneous TID WC Elpidio Eric McClung   3 Units at 09/25/11 1732  . DISCONTD:  insulin glargine (LANTUS) injection 10 Units  10 Units Subcutaneous QHS Hind I. Elsaid   10 Units at 09/27/11 1703  . DISCONTD: insulin glargine (LANTUS) injection 14 Units  14 Units Subcutaneous QHS Elpidio Eric McClung   14 Units at 09/24/11 2254  . DISCONTD: insulin glargine (LANTUS) injection 3 Units  3 Units Subcutaneous QHS Pleas Koch, MD   3 Units at 09/19/11 2140  . DISCONTD: insulin glargine (LANTUS) injection 5 Units  5 Units Subcutaneous QHS Hind I. Elsaid      . DISCONTD: insulin regular (HUMULIN R,NOVOLIN R) 1 Units/mL in sodium chloride 0.9 % 100 mL infusion   Intravenous Continuous Pleas Koch, MD 4.4 mL/hr at 09/19/11 0802 4.4 Units/hr at 09/19/11 0802  . DISCONTD: insulin regular (HUMULIN R,NOVOLIN R) 100 units/mL injection 10 Units  10 Units Subcutaneous Once Rise Patience, [place]      . DISCONTD: linagliptin (TRADJENTA) tablet 5 mg  5 mg Oral Daily Saima Rizwan   5 mg at 09/25/11 1003  . DISCONTD: metoprolol (LOPRESSOR) injection 5 mg  5 mg Intravenous Q6H Pleas Koch, MD   5 mg at 09/19/11 0007  . DISCONTD: metoprolol tartrate (LOPRESSOR) tablet 12.5 mg  12.5 mg Oral BID Pleas Koch, MD   12.5 mg at 09/21/11 2205  . DISCONTD: metoprolol tartrate (LOPRESSOR) tablet 25 mg  25 mg Oral Q6H Michael E. Hall   25 mg at 09/19/11 0405  . DISCONTD: metoprolol tartrate (LOPRESSOR) tablet 25 mg  25 mg Oral BID Saima Rizwan   25 mg at 09/22/11 2219  . DISCONTD: ondansetron (ZOFRAN) injection 4 mg  4 mg Intravenous Q6H PRN Raiford Simmonds, MD      . DISCONTD: piperacillin-tazobactam (ZOSYN) IVPB 3.375 g  3.375 g Intravenous Q8H Christian M Buettner, PHARMD   3.375 g at 09/27/11 1424  . DISCONTD: sodium chloride 0.9 % injection 3 mL  3 mL Intravenous Q12H Pleas Koch, MD      . DISCONTD: sodium chloride 0.9 % injection 3 mL  3 mL Intravenous PRN Pleas Koch, MD      . DISCONTD: sodium chloride irrigation 0.9 %    PRN Sherren Kerns, MD   1,000 mL at 09/26/11 1649  . DISCONTD: vancomycin (VANCOCIN)  1,500 mg in sodium chloride 0.9 % 500 mL IVPB  1,500 mg Intravenous Q24H Christian M Buettner, PHARMD      . DISCONTD: vancomycin (VANCOCIN) 1,500 mg in sodium chloride 0.9 % 500 mL IVPB  1,500 mg Intravenous Q24H Christian M Buettner, PHARMD   1,500 mg at 09/20/11 2000  . DISCONTD: vancomycin (VANCOCIN) 1,500 mg in sodium chloride 0.9 % 500 mL IVPB  1,500 mg Intravenous Q24H Mickeal Skinner, PHARMD   1,500 mg at 09/27/11 1206  . DISCONTD: vancomycin (VANCOCIN) IVPB 1000 mg/200 mL premix  1,000 mg Intravenous Once Hurman Horn, MD       No current outpatient prescriptions on  file as of 09/29/2011.   Current Medications: Current facility-administered medications:0.45 % sodium chloride infusion, , Intravenous, Continuous, Amelia Jo [place], [place], Last Rate: 15 mL/hr at 09/28/11 1638, 15 mL/hr at 09/28/11 1638;  acetaminophen (TYLENOL) suppository 325-650 mg, 325-650 mg, Rectal, Q4H PRN, Marlowe Shores, PA;  acetaminophen (TYLENOL) tablet 325-650 mg, 325-650 mg, Oral, Q4H PRN, Marlowe Shores, PA acetaminophen (TYLENOL) tablet 650 mg, 650 mg, Oral, Q4H PRN, Pleas Koch, MD, 650 mg at 09/26/11 2327;  acetaminophen (TYLENOL) tablet 650 mg, 650 mg, Oral, Once, Hind I. Elsaid;  allopurinol (ZYLOPRIM) tablet 300 mg, 300 mg, Oral, Daily, Saima Rizwan, 300 mg at 09/29/11 1038;  ALPRAZolam (XANAX) tablet 0.25 mg, 0.25 mg, Oral, BID PRN, Pleas Koch, MD, 0.25 mg at 09/28/11 2302 alum & mag hydroxide-simeth (MAALOX PLUS) 400-400-40 MG/5ML suspension 15-30 mL, 15-30 mL, Oral, Q2H PRN, Marlowe Shores, PA;  aspirin EC tablet 81 mg, 81 mg, Oral, Daily, Verne Carrow, MD, 81 mg at 09/29/11 1039;  bisacodyl (DULCOLAX) suppository 10 mg, 10 mg, Rectal, Daily PRN, Marlowe Shores, PA;  clopidogrel (PLAVIX) tablet 75 mg, 75 mg, Oral, Q breakfast, Pleas Koch, MD, 75 mg at 09/29/11 0810 dextrose 50 % solution 25 mL, 25 mL, Intravenous, PRN, Pleas Koch, MD;  docusate sodium (COLACE) capsule 100 mg, 100 mg,  Oral, Daily, Regina J [place], [place], 100 mg at 09/29/11 1039;  enoxaparin (LOVENOX) injection 40 mg, 40 mg, Subcutaneous, Q24H, Regina J [place], [place], 40 mg at 09/29/11 1039;  famotidine (PEPCID) tablet 20 mg, 20 mg, Oral, Daily, Dannielle Huh, PHARMD, 20 mg at 09/29/11 1040 furosemide (LASIX) injection 20 mg, 20 mg, Intravenous, Once, Hind I. Elsaid, 20 mg at 09/28/11 1640;  glimepiride (AMARYL) tablet 2 mg, 2 mg, Oral, BID, Hind I. Elsaid, 2 mg at 09/29/11 1040;  guaiFENesin-dextromethorphan (ROBITUSSIN DM) 100-10 MG/5ML syrup 15 mL, 15 mL, Oral, Q4H PRN, Amelia Jo Roczniak, PA;  hydrALAZINE (APRESOLINE) injection 10 mg, 10 mg, Intravenous, Q2H PRN, Amelia Jo Roczniak, PA hydrALAZINE (APRESOLINE) tablet 10 mg, 10 mg, Oral, BID, Hind I. Elsaid, 10 mg at 09/29/11 1041;  HYDROmorphone (DILAUDID) injection 0.5-1 mg, 0.5-1 mg, Intravenous, Q2H PRN, Amelia Jo Roczniak, PA, 1 mg at 09/27/11 0416;  insulin aspart (novoLOG) injection 0-20 Units, 0-20 Units, Subcutaneous, TID WC, Lonia Blood, 11 Units at 09/29/11 1206 insulin aspart (novoLOG) injection 0-5 Units, 0-5 Units, Subcutaneous, QHS, Lonia Blood, 4 Units at 09/27/11 2206;  insulin glargine (LANTUS) injection 5 Units, 5 Units, Subcutaneous, QHS, Hind I. Elsaid, 5 Units at 09/28/11 2124;  labetalol (NORMODYNE,TRANDATE) injection 10 mg, 10 mg, Intravenous, Q2H PRN, Marlowe Shores, PA;  linagliptin (TRADJENTA) tablet 5 mg, 5 mg, Oral, Daily, Hind I. Elsaid, 5 mg at 09/29/11 1041 living well with diabetes book MISC, , Does not apply, Once, Lonia Blood;  magnesium hydroxide (MILK OF MAGNESIA) suspension 30 mL, 30 mL, Oral, PRN, Amelia Jo Roczniak, PA;  metoprolol (LOPRESSOR) injection 2-5 mg, 2-5 mg, Intravenous, Q2H PRN, Marlowe Shores, PA;  metoprolol (LOPRESSOR) tablet 50 mg, 50 mg, Oral, BID, Gardiner Rhyme, MD, 50 mg at 09/29/11 1042 nitroGLYCERIN (NITROSTAT) SL tablet 0.4 mg, 0.4 mg, Sublingual, Q5 min PRN, Pleas Koch, MD;   ondansetron (ZOFRAN) injection 4 mg, 4 mg, Intravenous, Q6H PRN, Pleas Koch, MD;  oxyCODONE-acetaminophen (PERCOCET) 5-325 MG per tablet 1-2 tablet, 1-2 tablet, Oral, Q4H PRN, Marlowe Shores, PA, 1 tablet at 09/29/11 1450;  phenol (CHLORASEPTIC) mouth spray 1 spray, 1 spray, Mouth/Throat, PRN, Rene Kocher  J Roczniak, PA polyethylene glycol (MIRALAX / GLYCOLAX) packet 17 g, 17 g, Oral, Daily PRN, Amelia Jo Roczniak, PA;  potassium chloride SA (K-DUR,KLOR-CON) CR tablet 20 mEq, 20 mEq, Oral, Once, Hind I. Elsaid, 20 mEq at 09/28/11 1640;  rosuvastatin (CRESTOR) tablet 10 mg, 10 mg, Oral, Daily, Pleas Koch, MD, 10 mg at 09/29/11 1042;  sodium chloride 0.9 % injection 3 mL, 3 mL, Intravenous, Q12H, Verne Carrow, MD, 3 mL at 09/29/11 1000 sodium chloride 0.9 % injection 3 mL, 3 mL, Intravenous, PRN, Verne Carrow, MD;  sodium chloride 0.9 % injection 3 mL, 3 mL, Intravenous, PRN, Marlowe Shores, PA, 3 mL at 09/27/11 1015  Precautions/Special Needs:     Additional Precautions/Restrictions: Precautions Precautions: Fall Restrictions Weight Bearing Restrictions: No  Therapy Assessments; Current:  Cognition Arousal/Alertness: Awake/alert Overall Cognitive Status: Appears within functional limits for tasks assessed Orientation Level: Oriented X4 Cognition Arousal/Alertness: Awake/alert Orientation Level: Oriented X4 Impulsive and decreased safety awareness per wife. Fall risk   Home Living Lives With: Spouse Receives Help From: Family Type of Home: House Home Layout: One level Home Access: Stairs to enter [employer] of Steps: 1 Bathroom Shower/Tub: Tub/shower unit;Door [employer] Toilet: Standard Bathroom Accessibility: Yes How Accessible: Accessible via walker Home Adaptive Equipment: Shower chair with back Prior Function Level of Independence: Independent with basic ADLs;Independent with homemaking with ambulation;Independent with homemaking with  wheelchair;Independent with gait Able to Take Stairs?: Yes Driving: Yes Vocation: Retired Chartered loss adjuster: Appears Intact Stereognosis: Not tested Hot/Cold: Not tested Proprioception: Not tested  ADLs/Mobility:Current Functional Level: O.T. 09/27/11 POD #1 ADL Eating/Feeding: Simulated;Set up Where Assessed - Eating/Feeding: Edge of bed Grooming: Performed Where Assessed - Grooming: Sitting, bed Upper Body Bathing: Simulated;Chest;Right arm;Left arm;Abdomen;Set up Where Assessed - Upper Body Bathing: Sitting, bed Lower Body Bathing: Simulated;Maximal assistance Where Assessed - Lower Body Bathing: Sit to stand from bed Upper Body Dressing: Performed;Set up Upper Body Dressing Details (indicate cue type and reason): with donning gown Where Assessed - Upper Body Dressing: Sitting, bed Lower Body Dressing: Simulated;Maximal assistance Where Assessed - Lower Body Dressing: Sit to stand from bed Toilet Transfer: Not assessed Toilet Transfer Method: Not assessed Toileting - Clothing Manipulation: Not assessed Where Assessed - Toileting Clothing Manipulation: Not assessed Toileting - Hygiene: Not assessed Where Assessed - Toileting Hygiene: Not assessed Tub/Shower Transfer: Not assessed Tub/Shower Transfer Method: Not assessed Equipment Used: Rolling walker    P.T. 09/29/11 POD #3   Bed Mobility: Yes Supine to Sit: Min Assist; HOB flat; MinGuard assist for bridging to EOB after VC's. Supine to Sit Details: struggle to get up onto elbows then powered up well Sit to Stand: Min assist; From Bed: 1t Total assist Sit to Stand Details: vcs for hand placement; visual cues for technique; manual A for forward w/shift Stand to sit: min assist to chair Ambulation/Gait Assistance: Mod assist patient 70% with RW with verbal and visual cues for "swing to" technique; manual A to maneuver RW Ambulation Distance: 10 feet with RW Posture/Postural Control: no significant limitations Static  Sitting: Stand by assistance Activity tolerance limited by pain. Patient slow to process, could be due to [facility] &/or pain meds. Wife reports patient impulsive and lack of safety awareness since amputation. High Fall risk.     Home Assistive Devices/Equipment:  Home Assistive Devices/Equipment Home Assistive Devices/Equipment: CBG Meter  Prior Functional Levels:  Prior Functional Level Bed Mobility: independent Transfers: independent Mobility - Walk/Wheelchair: walk Upper Body Dressing: independent Lower Body Dressing: independent Grooming: independent Eating/Drinking: independent  Toilet Transfer: independent Bladder Continence: independent Bowel Management: independent Stair Climbing: independent Communication: independent Memory: independent Cooking/Meal Prep: independent Housework: independent Money Management: independent Driving: yes   Previous Home Environment:  Previous Investment banker, corporate: Spouse/significant other Support Systems: Spouse/significant other;Family members Do you have any problems obtaining your medications?: No Type of Residence: Private residence Home Care Services: Yes Type of Home Care Services: Home PT;Home OT;Home RN Patient expects to be discharged to:: Home Expected Discharge Date:  (ELOS 7 to 10 days) Home Environment Number of Levels: one level Previous Home Environment Number of Steps: 1 Previous Home Environment Is Bedroom on Main Floor?: Yes Previous Home Environment Is Bathroom on Main Floor?: Yes   Discharge Living Setting:  Discharge Living Setting Plans for Discharge Living Setting: Patient's home  Social/Family/Support Systems:  Social/Family/Support Systems Patient Roles: Spouse;Parent Contact Information: Todd Cook [phone] Anticipated Caregiver: wife and children Anticipated Caregiver's Contact Information: [phone] Ability/Limitations of Caregiver: min assist Caregiver Availability:  24/7 Discharge Plan Discussed with Primary Caregiver: Yes Is Caregiver In Agreement with Plan?: Yes Does Caregiver/Family have Issues with Lodging/Transportation while Pt is in Rehab?: No (wife stays with pt in hospital 24/7)  Goals/Additional Needs:  Goals/Additional Needs Patient/Family Goal for Rehab: mod I to supervision for P.T. and O.T. ADLS Pt/Family Agrees to Admission and willing to participate: Yes Program Orientation Provided & Reviewed with Pt/Caregiver Including Roles  & Responsibilities: Yes  Preadmission Screen Completed By:  Clois Dupes, 09/29/2011 4:11 PM  Patient's condition:  This patient's condition remains as documented in the Consult dated 09/27/2011, in which the Rehabilitation Physician determined and documented that the patient's condition is appropriate for intensive rehabilitative care in an inpatient rehabilitation facility.  Preadmission Screen Competed by: Ottie Glazier, RN, Time/Date,1143 on 09/29/2011.  Discussed status with Dr. Riley Kill  On 09/29/11 at 1503 and received telephone approval for admission today.  Admission Coordinator:  Clois Dupes, time 2956 Date 09/29/2011.  Marland Kitchen

## 2011-09-27 NOTE — Consult Note (Signed)
Physical Medicine and Rehabilitation Consult Reason for Consult:  Left BKA Referring Phsyician: Dr. Darrick Penna.    HPI: Todd Cook is an 70 y.o. Male. With history of CKD, DM, PVD with chronic left great toe ulcer; admitted 09/18/11 with chest pain due to NSTEMI.  Pt underwent PTCA with stent placement on 09/20/11 by Dr. Sanjuana Kava.  Evaluated by Dr Darrick Penna and as aortogram with evidence of severe PVD, surgery recommended.  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  For PT evaluation today.  MD recommending CIR.  Review of Systems - General ROS: negative.  No SOB, CP, abdominal pain.  Dizziness and diaphoresis at EOB today.     Past Medical History  Diagnosis Date  . Blood transfusion   . Angina   . Chronic kidney disease   . Anemia   . Diabetes mellitus   . Peripheral vascular disease   . Hypertension   . Coronary artery disease   . Myocardial infarction   . Shortness of breath   . Gout     left foot   Past Surgical History  Procedure Date  . 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 Knee arthroplasty     2000  . Cataract extraction, bilateral   . Eye surgery   . Cardiac catheterization    Family history:  Father with MI at age 66s.                           Sister with DM and BKA.  Social History: Married, independent and active PTA.  Reports that he has never smoked. He does not have any smokeless tobacco history on file. He reports that he does not drink alcohol or use illicit drugs.  Wife supportive and can assist past discharge.   Allergies  Allergen Reactions  . Altace     No current outpatient prescriptions on file as of 09/27/2011.    Home: Home Living Lives With: Spouse Receives Help From: Family Type of Home: House Home Layout: One level Home Access: Stairs to enter [employer] of Steps: 1 plus threshold. Bathroom Shower/Tub: Tub/shower unit;Door [employer] Toilet: Standard Bathroom Accessibility: Yes How Accessible: Accessible via  walker Home Adaptive Equipment: Shower chair with back  Functional History: Prior Function Level of Independence: Independent with basic ADLs;Independent with homemaking with ambulation;Independent with homemaking with wheelchair;Independent with gait Able to Take Stairs?: Yes Driving: Yes Vocation: Retired Functional Status:  Mobility:          ADL: ADL Eating/Feeding: Simulated;Set up Where Assessed - Eating/Feeding: Edge of bed Grooming: Performed  Cognition: Cognition Orientation Level: Oriented to person;Oriented to place Cognition Orientation Level: Oriented to person;Oriented to place  Blood pressure 117/67, pulse 73, temperature 97.7 F (36.5 C), temperature source Oral, resp. rate 18, height 5\' 10"  (1.778 m), weight 83.8 kg (184 lb 11.9 oz), SpO2 95.00%. 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 Does the need for close, 24 hr/day medical supervision in concert with the patient's rehab needs make it unreasonable for this patient to be served in a less intensive setting? Yes 2. Co-Morbidities requiring supervision/potential complications: Diabetes, anemia, chronic kidney disease, hypertension, coronary artery disease 3. Due to bladder management, bowel management, skin/wound care and pain management, does the patient require 24 hr/day rehab nursing? Yes 4. Does the patient require coordinated care of a physician, rehab nurse, PT (1-2 hrs/day, 5 days/week) OT1 to 2 hours per day 5 days  per week to address physical and functional deficits in the context of the above medical diagnosis(es)? Yes Addressing deficits in the following areas: balance, endurance and locomotion 5. Can the patient actively participate in an intensive therapy program of at least 3 hrs of therapy per day at least 5 days per week? Yes 6. The potential for patient to make measurable gains while on inpatient rehab is  excellent 7. Anticipated functional outcomes upon discharge from inpatients are modified independent with all mobility PT, modified independent to supervision with activities of daily living OT,    Estimated rehab length of stay to reach the above functional goals is: 7-10 days 8. Does the patient have adequate social supports to accommodate these discharge functional goals? Yes 9. Anticipated D/C setting: Home 10. Anticipated post D/C treatments: HH therapy 11. Overall Rehab/Functional Prognosis: excellent  RECOMMENDATIONS: This patient's condition is appropriate for continued rehabilitative care in the following setting: CIR Patient has agreed to participate in recommended program. Yes Note that insurance prior authorization may be required for reimbursement for recommended care.  Comment:   Jacquelynn Cree 09/27/2011

## 2011-09-27 NOTE — Plan of Care (Signed)
Problem: Discharge Progression Outcomes Goal: Activity appropriate for discharge plan Outcome: Progressing PT eval completed, PT recommends CIR at d/c and will follow acutely.

## 2011-09-27 NOTE — Plan of Care (Signed)
Problem: Discharge Progression Outcomes Goal: Activity appropriate for discharge plan Outcome: Progressing OT recommending CIR at D/C to increase functional independence with ADLs and decrease burden of care at D/C home. Will follow acutely.

## 2011-09-27 NOTE — Progress Notes (Signed)
I met with patient and his wife at bedside. POD# 1. Noted P.T. and O.T. evaluations that recommend CIR/ inpatient rehabilitation stay prior to discharge home with wife. I will begin insurance authorization with [facility]. Wife and patient in agreement to Inpatient rehabilitation stay. Admit will depend on insurance approval. Please call me with any questions. Pager [phone]. I will follow up in the morning.

## 2011-09-27 NOTE — Progress Notes (Addendum)
ANTIBIOTIC CONSULT NOTE - FOLLOW UP  Pharmacy Consult for vancomycin Day#4, Zosyn Day#8 Indication: osteomyelitis  Allergies  Allergen Reactions  . Altace     Patient Measurements: Height: 5\' 10"  (177.8 cm) Weight: 184 lb 11.9 oz (83.8 kg) IBW/kg (Calculated) : 73    Vital Signs: Temp: 97.7 F (36.5 C) (11/14 0558) Temp src: Oral (11/14 0558) BP: 109/86 mmHg (11/14 0825) Pulse Rate: 73  (11/14 0558) Intake/Output from previous day: 11/13 0701 - 11/14 0700 In: 1400 [P.O.:30; I.V.:750; IV Piggyback:500] Out: 1400 [Urine:1200; Blood:200] Intake/Output from this shift:    Labs:  [facility] 09/27/11 0642 09/26/11 2334 09/26/11 0645 09/25/11 0710  WBC 14.0* 18.2* -- 13.9*  HGB 8.2* 8.8* -- 10.3*  PLT 279 283 -- 193  LABCREA -- -- -- --  CREATININE 2.15* 1.98* 1.80* --   Estimated Creatinine Clearance: 32.1 ml/min (by C-G formula based on Cr of 2.15).   Microbiology: Recent Results (from the past 720 hour(s))  CULTURE, BLOOD (ROUTINE X 2)     Status: Normal   Collection Time   09/18/11  2:38 PM      Component Value Range Status Comment   Specimen Description BLOOD ARM LEFT   Final    Special Requests BOTTLES DRAWN AEROBIC ONLY 10CC   Final    Setup Time [id number]   Final    Culture     Final    Value: VIRIDANS STREPTOCOCCUS     Note: Gram Stain Report Called to,Read Back By and Verified With: CHRISTIAN TOOENTINO 09/19/11 @ 1822 HAJAM   Report Status 09/21/2011 FINAL   Final   CULTURE, BLOOD (ROUTINE SINGLE)     Status: Normal   Collection Time   09/18/11  4:07 PM      Component Value Range Status Comment   Specimen Description BLOOD ARM RIGHT   Final    Special Requests BOTTLES DRAWN AEROBIC AND ANAEROBIC [facility]   Final    Setup Time [id number]   Final    Culture NO GROWTH 5 DAYS   Final    Report Status 09/24/2011 FINAL   Final   MRSA PCR SCREENING     Status: Normal   Collection Time   09/18/11  8:59 PM      Component Value Range Status Comment   MRSA  by PCR NEGATIVE  NEGATIVE  Final   URINE CULTURE     Status: Normal   Collection Time   09/19/11  8:18 AM      Component Value Range Status Comment   Specimen Description URINE, CLEAN CATCH   Final    Special Requests NONE   Final    Setup Time [id number]   Final    Colony Count NO GROWTH   Final    Culture NO GROWTH   Final    Report Status 09/20/2011 FINAL   Final   SURGICAL PCR SCREEN     Status: Normal   Collection Time   09/25/11  5:01 PM      Component Value Range Status Comment   MRSA, PCR NEGATIVE  NEGATIVE  Final    Staphylococcus aureus NEGATIVE  NEGATIVE  Final     Anti-infectives     Start     Dose/Rate Route Frequency Ordered Stop   09/26/11 2230   cefUROXime (ZINACEF) 1.5 g in dextrose 5 % 50 mL IVPB  Status:  Discontinued        1.5 g 100 mL/hr over 30 Minutes Intravenous Every 12 hours 09/26/11  2226 09/26/11 2316   09/24/11 1300   vancomycin (VANCOCIN) 1,500 mg in sodium chloride 0.9 % 500 mL IVPB        1,500 mg 250 mL/hr over 120 Minutes Intravenous Every 24 hours 09/24/11 1136     09/19/11 1000   vancomycin (VANCOCIN) 1,500 mg in sodium chloride 0.9 % 500 mL IVPB  Status:  Discontinued        1,500 mg 250 mL/hr over 120 Minutes Intravenous Every 24 hours 09/18/11 1733 09/18/11 2315   09/18/11 2300  piperacillin-tazobactam (ZOSYN) IVPB 3.375 g       3.375 g 12.5 mL/hr over 240 Minutes Intravenous 3 times per day 09/18/11 1733     09/18/11 2000   vancomycin (VANCOCIN) 1,500 mg in sodium chloride 0.9 % 500 mL IVPB  Status:  Discontinued        1,500 mg 250 mL/hr over 120 Minutes Intravenous Every 24 hours 09/18/11 1946 09/21/11 1509   09/18/11 1500  piperacillin-tazobactam (ZOSYN) IVPB 3.375 g       3.375 g 100 mL/hr over 30 Minutes Intravenous  Once 09/18/11 1431 09/22/11 1430   09/18/11 1445   vancomycin (VANCOCIN) IVPB 1000 mg/200 mL premix  Status:  Discontinued        1,000 mg 200 mL/hr over 60 Minutes Intravenous  Once 09/18/11 1431 09/18/11 1945           Assessment: 70 YOM with PVD and LE ulcers and osteomyelitis, re-vascularization was not possible and L BKA done 11/13. He continues on vancomycin D#4 and zosyn D#8.  SCr has increased post-op.  Blood cultures grew viridans strep 11/5 1/2 cultures, so likely contaminant.   Goal of Therapy:  Vancomycin trough level 15-20 mcg/ml  Plan:  1. Check trough for rise in SCr. 2. Follow-up length of therapy if amputated margins clear of infection.   Addendum: vancomycin trough =85mcg/ml, but vancomycin was infusing for when level drawn so NOT a true trough. Will re-order for tomorrow.  Dannielle Huh 09/27/2011,10:06 AM

## 2011-09-27 NOTE — Progress Notes (Addendum)
[facility] [place]  Postoperative Visit - Amputation  Date of Surgery: 09/18/2011 - 09/26/2011 Procedure: Procedure(s): AMPUTATION BELOW KNEE Surgeon: Surgeon(s): Sherren Kerns, MD POD: 1 Day Post-Op  History of Present Illness  Todd Cook is a 70 y.o. male who presents for postoperative follow-up EAV:WUJW BKA: edema control with ace wraps or stump shrinker.   Pt.denies increased pain in the stump. The patient notes pain is well controlled.  The patient's current symptoms are: mod pain controlled well with pain meds.  Pt is able to lift leg and keep knee straight Pt. denies phantom pain.  Significant Diagnostic Studies: CBC    Component Value Date/Time   WBC 14.0* 09/27/2011 0642   RBC 2.81* 09/27/2011 0642   HGB 8.2* 09/27/2011 0642   HCT 24.8* 09/27/2011 0642   PLT 279 09/27/2011 0642   MCV 88.3 09/27/2011 0642   MCH 29.2 09/27/2011 0642   MCHC 33.1 09/27/2011 0642   RDW 13.9 09/27/2011 0642   LYMPHSABS 0.4* 09/18/2011 1420   MONOABS 0.6 09/18/2011 1420   EOSABS 0.0 09/18/2011 1420   BASOSABS 0.0 09/18/2011 1420    BMET    Component Value Date/Time   NA 130* 09/27/2011 0642   K 5.0 09/27/2011 0642   CL 101 09/27/2011 0642   CO2 17* 09/27/2011 0642   GLUCOSE 352* 09/27/2011 0642   BUN 45* 09/27/2011 0642   CREATININE 2.15* 09/27/2011 0642   CALCIUM 8.0* 09/27/2011 0642   GFRNONAA 29* 09/27/2011 0642   GFRAA 34* 09/27/2011 0642    COAG Lab Results  Component Value Date   INR 1.25 09/19/2011   No results found for this basename: PTT     I/O last 3 completed shifts: In: 1400 [P.O.:30; I.V.:750; Other:120; IV Piggyback:500] Out: 2350 [Urine:2150; Blood:200] No data found.    Physical Examination  Patient Vitals for the past 24 hrs:  BP Temp Temp src Pulse Resp SpO2  09/27/11 0558 117/67 mmHg 97.7 F (36.5 C) Oral 73  18  95 %  09/26/11 2230 108/64 mmHg 98.1 F (36.7 C) - 77  18  -  09/26/11 2200 104/64 mmHg 97.1 F  (36.2 C) - 77  20  -  09/26/11 2130 140/71 mmHg 97.1 F (36.2 C) - 71  20  -  09/26/11 2115 130/61 mmHg 96.8 F (36 C) - 68  20  -  09/26/11 2100 139/85 mmHg 96.8 F (36 C) - 72  20  -  09/26/11 2045 139/71 mmHg 96.6 F (35.9 C) - 72  18  -  09/26/11 2021 - - - 67  17  -  09/26/11 2019 - - - - - 93 %  09/26/11 2015 - - - 65  16  94 %  09/26/11 2009 147/49 mmHg - - - - -  09/26/11 2005 - 98.5 F (36.9 C) - 65  20  96 %  09/26/11 2000 - - - 66  15  96 %  09/26/11 1954 139/54 mmHg - - - - -  09/26/11 1948 - - - 64  15  -  09/26/11 1945 - - - 65  22  97 %  09/26/11 1939 131/53 mmHg - - - - -  09/26/11 1930 - - - 63  13  100 %  09/26/11 1924 101/38 mmHg - - - - -  09/26/11 1915 - - - 67  18  99 %  09/26/11 1909 118/65 mmHg - - - - -  09/26/11 1900 - - -  65  17  97 %  09/26/11 1854 115/45 mmHg - - - - -  09/26/11 1845 - - - 64  14  98 %  09/26/11 1839 103/74 mmHg - - - - -  09/26/11 1830 - - - 66  17  98 %  09/26/11 1824 90/71 mmHg - - - - -  09/26/11 1815 - - - 70  20  94 %  09/26/11 1809 141/39 mmHg - - - - -  09/26/11 1800 - 97.5 F (36.4 C) - 71  - 94 %  09/26/11 1339 124/73 mmHg 98.5 F (36.9 C) Oral 58  19  96 %     Pt is A&Ox3  WDWN male with no complaints  Left amputation site dressing is dry and intact Thigh is warm and well perfused,   Assessment/plan: Post -op Anemia - acute blood loss- will defer to primary team for transfusion needs Todd Cook is a 70 y.o. male who is s/p left BKA: edema control with ace wraps or stump shrinker . Change dressing in AM PT/OT/CIR  ROCZNIAK,REGINA J 09/27/2011 8:41 AM     Exam and history details as above Had some weakness and dyspnea when he got up today. Change dressing tomorrow Would have low threshold to transfuse in light of recent MI.  Fabienne Bruns, MD [facility] [place] Office: [phone] Pager: [phone]

## 2011-09-27 NOTE — Progress Notes (Signed)
Subjective: Patient denies any chest pain, shortness of breath, he admitted he try to walk with a walker and he experienced some dizzy spells, patient currently is pain-free, denies any nausea vomiting or abdomen pain, has a regular bowel movement Objective: Vital signs in last 24 hours: Filed Vitals:   09/26/11 2230 09/27/11 0558 09/27/11 0825 09/27/11 1446  BP: 108/64 117/67 109/86 122/62  Pulse: 77 73  60  Temp: 98.1 F (36.7 C) 97.7 F (36.5 C)  97.7 F (36.5 C)  TempSrc:  Oral  Oral  Resp: 18 18  19   Height:      Weight:      SpO2:  95%  100%   Weight change:   Intake/Output Summary (Last 24 hours) at 09/27/11 1541 Last data filed at 09/27/11 1300  Gross per 24 hour  Intake   1230 ml  Output    600 ml  Net    630 ml   ON EXAM General: Alert, awake, oriented x3, in no acute distress.  HEENT: No bruits, no goiter.  Heart: Regular rate and rhythm, without murmurs, rubs, gallops.  Lungs: , bilateral air movement, no wheezes or ronchy.  Abdomen: Soft, nontender, nondistended, positive bowel sounds.  Neuro: Grossly intact, nonfocal.  Extremities: SP LEFT BELOW KNEE AMBPUTATION     Lab Results:  Basename 09/27/11 1610 09/26/11 2334 09/26/11 0645  NA 130* -- 133*  K 5.0 -- 3.8  CL 101 -- 102  CO2 17* -- 19  GLUCOSE 352* -- 189*  BUN 45* -- 28*  CREATININE 2.15* 1.98* --  CALCIUM 8.0* -- 8.5  MG -- -- --  PHOS -- -- --   No results found for this basename: AST:2,ALT:2,ALKPHOS:2,BILITOT:2,PROT:2,ALBUMIN:2 in the last 72 hours No results found for this basename: LIPASE:2,AMYLASE:2 in the last 72 hours  Basename 09/27/11 0642 09/26/11 2334  WBC 14.0* 18.2*  NEUTROABS -- --  HGB 8.2* 8.8*  HCT 24.8* 26.9*  MCV 88.3 89.4  PLT 279 283    Micro Results: Recent Results (from the past 240 hour(s))  CULTURE, BLOOD (ROUTINE X 2)     Status: Normal   Collection Time   09/18/11  2:38 PM      Component Value Range Status Comment   Specimen Description BLOOD ARM  LEFT   Final    Special Requests BOTTLES DRAWN AEROBIC ONLY 10CC   Final    Setup Time [id number]   Final    Culture     Final    Value: VIRIDANS STREPTOCOCCUS     Note: Gram Stain Report Called to,Read Back By and Verified With: CHRISTIAN TOOENTINO 09/19/11 @ 1822 HAJAM   Report Status 09/21/2011 FINAL   Final   CULTURE, BLOOD (ROUTINE SINGLE)     Status: Normal   Collection Time   09/18/11  4:07 PM      Component Value Range Status Comment   Specimen Description BLOOD ARM RIGHT   Final    Special Requests BOTTLES DRAWN AEROBIC AND ANAEROBIC [facility]   Final    Setup Time [id number]   Final    Culture NO GROWTH 5 DAYS   Final    Report Status 09/24/2011 FINAL   Final   MRSA PCR SCREENING     Status: Normal   Collection Time   09/18/11  8:59 PM      Component Value Range Status Comment   MRSA by PCR NEGATIVE  NEGATIVE  Final   URINE CULTURE     Status: Normal  Collection Time   09/19/11  8:18 AM      Component Value Range Status Comment   Specimen Description URINE, CLEAN CATCH   Final    Special Requests NONE   Final    Setup Time [id number]   Final    Colony Count NO GROWTH   Final    Culture NO GROWTH   Final    Report Status 09/20/2011 FINAL   Final   SURGICAL PCR SCREEN     Status: Normal   Collection Time   09/25/11  5:01 PM      Component Value Range Status Comment   MRSA, PCR NEGATIVE  NEGATIVE  Final    Staphylococcus aureus NEGATIVE  NEGATIVE  Final     Studies/Results: Dg Chest 2 View  09/18/2011  *RADIOLOGY REPORT*  Clinical Data: Diabetic left foot ulcerations/abscess.  Prior CABG.  CHEST - 2 VIEW 09/18/2011:  Comparison: None.  Findings: Prior sternotomy for CABG.  Cardiac silhouette mildly enlarged.  Thoracic aorta tortuous and atherosclerotic.  Hilar and mediastinal contours otherwise unremarkable.  Suboptimal inspiration.  Pulmonary venous hypertension without overt edema. Lungs clear.  No pleural effusions.  Degenerative changes and DISH involving the  thoracic spine.  IMPRESSION: Suboptimal inspiration.  Mild cardiomegaly.  Pulmonary venous hypertension without overt edema.  No acute cardiopulmonary disease.  Original Report Authenticated By: Arnell Sieving, M.D.   Mr Foot Left Wo Contrast  09/24/2011  *RADIOLOGY REPORT*  Clinical Data: Left foot infection with draining wound on the medial aspect of the left foot.  MRI OF THE LEFT FOREFOOT WITHOUT CONTRAST  Technique:  Multiplanar, multisequence MR imaging was performed. No intravenous contrast was administered.  Comparison: Radiographs dated 09/18/2011  Findings: There is extensive pus in and around the first metatarsal phalangeal joint extending into the soft tissues of the distal great toe particularly medially.  This fluid signal intensity extends into the webspace between the first and second toes and extends into the atrophic muscles of the plantar aspect of the foot almost to the base of the second metatarsal.  The fluid extends under the extensor tendon of the second toe and there is fluid in the second metatarsal phalangeal joint. This is probably infected as well.  There is slight edema in the medial and lateral sesamoids adjacent to the head of the first metatarsal and there is subtle edema in the head and shaft of the first metatarsal suggesting osteomyelitis.  Low signal intensity is seen in the soft tissues of the great toe consistent with gas seen on the radiographs.  IMPRESSION:  1.  Septic first metatarsal phalangeal joint. 2.  Osteomyelitis involving the head and shaft of the first metatarsal. 3.  Pus extending into the soft tissues of the great toe and into the plantar muscles under the first and second metatarsals. 3.  Small effusion at the second metatarsal phalangeal joint with fluid under the extensor tendon which probably represents infection as well.  Original Report Authenticated By: Gwynn Burly, M.D.   Dg Chest Portable 1 View  09/20/2011  *RADIOLOGY REPORT*  Clinical  Data: Shortness of breath.  PORTABLE CHEST - 1 VIEW  Comparison: None.  Findings: Cardiomegaly.  Central vascular congestion.  Interval development of right greater than left airspace opacities.  Status post median sternotomy and CABG.  No acute osseous abnormality.  No pneumothorax.  There may be small pleural effusions.  IMPRESSION: Interval development of right greater than left airspace opacities. The rapid onset favors a process such as  asymmetric edema.  Original Report Authenticated By: Waneta Martins, M.D.       Medications: I have reviewed the patient's current medications. Scheduled Meds:   . allopurinol  300 mg Oral Daily  . aspirin EC  81 mg Oral Daily  . clopidogrel  75 mg Oral Q breakfast  . docusate sodium  100 mg Oral Daily  . enoxaparin  40 mg Subcutaneous Q24H  . famotidine (PEPCID) IV  20 mg Intravenous Q12H  . famotidine  20 mg Oral Daily  . hydrALAZINE  10 mg Oral Q8H  . insulin aspart  0-20 Units Subcutaneous TID WC  . insulin aspart  0-5 Units Subcutaneous QHS  . living well with diabetes book   Does not apply Once  . metoprolol tartrate  50 mg Oral BID  . piperacillin-tazobactam (ZOSYN)  IV  3.375 g Intravenous Q8H  . rosuvastatin  10 mg Oral Daily  . sodium chloride  3 mL Intravenous Q12H  . vancomycin  1,500 mg Intravenous Q24H  . DISCONTD: aspirin EC  325 mg Oral Daily  . DISCONTD: cefUROXime (ZINACEF) IV  1.5 g Intravenous Q12H   Continuous Infusions:   . sodium chloride 20 mL/hr at 09/27/11 1017   PRN Meds:.acetaminophen, acetaminophen, acetaminophen, ALPRAZolam, alum & mag hydroxide-simeth, bisacodyl, dextrose, guaiFENesin-dextromethorphan, hydrALAZINE, HYDROmorphone (DILAUDID) injection, labetalol, magnesium hydroxide, magnesium sulfate IVPB 1 gram, metoprolol, nitroGLYCERIN, ondansetron (ZOFRAN) IV, oxyCODONE-acetaminophen, phenol, polyethylene glycol, potassium chloride, sodium chloride, sodium chloride, DISCONTD: HYDROmorphone DISCONTD:  ondansetron (ZOFRAN) IV, DISCONTD: sodium chloride irrigation  Assessment/Plan: Principal Problem:  1-Non-STEMI (non-ST elevated myocardial infarction):patient underwent PTCA and stent placement with drug-eluting stent, on aspirin and Plavix, denies any chest pain  2- S/P CABG x 4: Continue Plavix and aspirin  Osteomyelitis: Left foot,peripherovascular disease status post a left below-knee amputation on Zosyn and vancomycin. We'll likely DC antibiotic at the time of discharge, per and Dr. Jorene Minors case is discussed with Dr. Maurice March ID in he suspect one out of 2 blood cultures Streptococcus viridans is contamination, I would agree with repeating blood culture and make sure is negative before discharge. Patient leukocytes count trending down.  DM (diabetes mellitus) type I uncontrol: Would resume the patient's home dose of Lantus,amaryl ,linagilptine  CBG is high today ,he resumed diet and tolerated.  Diverticulosis of colon: stable  S/P carotid endarterectomy  Iron deficiency anemia: may need blood transfusion if drop below 8 Non anion gap metabolic acidosis : secondary to renal insuffiency CKD: stable mild elevated creatinine . Will check in am Disposition the CIR IF accepted      LOS: 9 days  Olaf Mesa I. 09/27/2011, 3:41 PM

## 2011-09-27 NOTE — Progress Notes (Addendum)
[facility] Progress Note Patient Name: Todd Cook 09/27/2011 6:58 AM    Principal Problem:  *Non-STEMI (non-ST elevated myocardial infarction) Active Problems:  S/P CABG x 4  Osteomyelitis  DM (diabetes mellitus) type I uncontrolled, periph vascular disorder  S/P carotid endarterectomy  Iron deficiency anemia  HTN (hypertension)  Cardiomyopathy, ischemic    SUBJECTIVE: Patient doing well. No chest pain or SOB. No overnight events.  OBJECTIVE  Filed Vitals:   09/27/11 0558  BP: 117/67  Pulse: 73  Temp: 97.7 F (36.5 C)  TempSrc: Oral  Resp: 18  Height:   Weight:   SpO2: 95%    Intake/Output Summary (Last 24 hours) at 09/27/11 0658 Last data filed at 09/26/11 1758  Gross per 24 hour  Intake   1400 ml  Output   1000 ml  Net    400 ml    PHYSICAL EXAM  General: Pleasant, elderly white male. Well developed, well nourished, in no acute distress. Neck: Supple with right carotid bruit; No JVD. Lungs:  Resp regular and unlabored, CTAB. Heart: RRR no murmurs, rubs or gallops Abdomen: Soft, non-tender, non-distended, BS + x 4.  Extremities: Left leg stump with dry, intact dressing. No clubbing, cyanosis or edema bilaterally.  Neuro: Alert and oriented X 3. Moves all extremities spontaneously. Psych: Normal affect.  LABS: CBC:  Basename 09/26/11 2334 09/25/11 0710  WBC 18.2* 13.9*  NEUTROABS -- --  HGB 8.8* 10.3*  HCT 26.9* 30.8*  MCV 89.4 87.5  PLT 283 193    Basic Metabolic Panel:  Basename 09/26/11 2334 09/26/11 0645 09/25/11 0710  NA -- 133* 135  K -- 3.8 3.4*  CL -- 102 103  CO2 -- 19 23  GLUCOSE -- 189* 80  BUN -- 28* 29*  CREATININE 1.98* 1.80* --  CALCIUM -- 8.5 8.8    TELE: SR 60s-70s, occasional PVCs  ECG: No new since 09/20/11  Radiology/Studies:  No new studies since 09/24/11   Inpatient Medications: . allopurinol  300 mg Oral Daily  . aspirin EC  325 mg Oral Daily  . clopidogrel  75 mg Oral Q breakfast  .  docusate sodium  100 mg Oral Daily  . enoxaparin  40 mg Subcutaneous Q24H  . famotidine (PEPCID) IV  20 mg Intravenous Q12H  . hydrALAZINE  10 mg Oral Q8H  . insulin aspart  0-20 Units Subcutaneous TID WC  . insulin aspart  0-5 Units Subcutaneous QHS  . living well with diabetes book   Does not apply Once  . metoprolol tartrate  50 mg Oral BID  . piperacillin-tazobactam (ZOSYN)  IV  3.375 g Intravenous Q8H  . rosuvastatin  10 mg Oral Daily  . sodium chloride  3 mL Intravenous Q12H  . vancomycin  1,500 mg Intravenous Q24H  . DISCONTD: cefUROXime (ZINACEF) IV  1.5 g Intravenous Q12H    ASSESSMENT AND PLAN:  1. CAD: Admitted with NSTEMI. Now s/p PCI and doing well. Continue current cardiac therapy including ASA and Plavix.  2. Atrial fibrillation: In NSR today. Will discuss timing of initiation of Coumadin with MD. 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 Doing well post op. 4. Ischemic CM: Echo 09/20/11 revealed EF 35%. Continue medical therapy. He is on a beta blocker, but not on an ACE-inhibitor secondary to renal insufficiency.  5. CRI: Stable post cath.   Signed, HOPE, JESSICA , PA-C   I  have personally seen and examined  this patient with [facility] PA-C. I agree with her assessment and plan as outlined above. His cardiac issues are stable. Will decrease ASA to 81mg  po Qdaily. We will continue to follow with you.   Dolores Ewing 9:16 AM

## 2011-09-27 NOTE — Progress Notes (Signed)
Occupational Therapy Evaluation Patient Details Name: Todd Cook MRN: [id number] DOB: [birth year] Today's Date: 09/27/2011  Problem List:  Patient Active Problem List  Diagnoses  . Non-STEMI (non-ST elevated myocardial infarction)  . S/P CABG x 4  . Osteomyelitis  . DM (diabetes mellitus) type I uncontrolled, periph vascular disorder  . Diverticulosis of colon  . S/P carotid endarterectomy  . Iron deficiency anemia  . Gout attack  . HTN (hypertension)  . Nephrolithiasis  . Cardiomyopathy, ischemic    Past Medical History:  Past Medical History  Diagnosis Date  . Blood transfusion   . Angina   . Chronic kidney disease   . Anemia   . Diabetes mellitus   . Peripheral vascular disease   . Hypertension   . Coronary artery disease   . Myocardial infarction   . Shortness of breath   . Gout     left foot   Past Surgical History:  Past Surgical History  Procedure Date  . 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 Knee arthroplasty     2000  . Cataract extraction, bilateral   . Eye surgery   . Cardiac catheterization     OT Assessment/Plan/Recommendation OT Assessment Clinical Impression Statement: Pt. will benefit from skilled OT in acute care to increase function with ADLs and decrease burden of care at next venue of care OT Recommendation/Assessment: Patient will need skilled OT in the acute care venue OT Problem List: Decreased strength;Decreased activity tolerance;Impaired balance (sitting and/or standing);Decreased safety awareness;Decreased knowledge of use of DME or AE Barriers to Discharge: Decreased caregiver support (Pt's wife not able to provide assist at D/C only supervision) OT Therapy Diagnosis : Acute pain OT Plan OT Frequency: Min 2X/week OT Treatment/Interventions: Self-care/ADL training;Energy conservation;DME and/or AE instruction;Therapeutic activities;Patient/family education;Balance training OT Recommendation Follow Up Recommendations:  Inpatient Rehab Equipment Recommended: Defer to next venue Individuals Consulted Consulted and Agree with Results and Recommendations: Patient;Family member/caregiver OT Goals Acute Rehab OT Goals OT Goal Formulation: With patient Time For Goal Achievement: 2 weeks ADL Goals Pt Will Perform Grooming: with set-up;Standing at sink;with min assist ADL Goal: Grooming - Progress: Other (comment) Pt Will Perform Lower Body Bathing: with min assist;Sitting, edge of bed (With AE use) ADL Goal: Lower Body Bathing - Progress: Other (comment) Pt Will Perform Lower Body Dressing: with min assist;with adaptive equipment;Sit to stand from bed ADL Goal: Lower Body Dressing - Progress: Other (comment) Pt Will Transfer to Toilet: with supervision;3-in-1;Squat pivot transfer ADL Goal: Toilet Transfer - Progress: Other (comment) Pt Will Perform Toileting - Hygiene: with set-up;Leaning right and/or left on 3-in-1/toilet ADL Goal: Toileting - Hygiene - Progress: Other (comment)  OT Evaluation Precautions/Restrictions  Precautions Precautions: Fall Restrictions Weight Bearing Restrictions: No Prior Functioning Home Living Lives With: Spouse Receives Help From: Family Type of Home: House Home Layout: One level Home Access: Stairs to enter [employer] of Steps: 1 Bathroom Shower/Tub: Tub/shower unit;Door [employer] Toilet: Standard Bathroom Accessibility: Yes How Accessible: Accessible via walker Home Adaptive Equipment: Shower chair with back Prior Function Level of Independence: Independent with basic ADLs;Independent with homemaking with ambulation;Independent with homemaking with wheelchair;Independent with gait Able to Take Stairs?: Yes Driving: Yes Vocation: Retired ADL ADL Eating/Feeding: Simulated;Set up Where Assessed - Eating/Feeding: Edge of bed Grooming: Performed Where Assessed - Grooming: Sitting, bed Upper Body Bathing: Simulated;Chest;Right arm;Left arm;Abdomen;Set  up Where Assessed - Upper Body Bathing: Sitting, bed Lower Body Bathing: Simulated;Maximal assistance Where Assessed - Lower Body Bathing: Sit to stand from bed  Upper Body Dressing: Performed;Set up Upper Body Dressing Details (indicate cue type and reason): with donning gown Where Assessed - Upper Body Dressing: Sitting, bed Lower Body Dressing: Simulated;Maximal assistance Where Assessed - Lower Body Dressing: Sit to stand from bed Toilet Transfer: Not assessed Toilet Transfer Method: Not assessed Toileting - Clothing Manipulation: Not assessed Where Assessed - Toileting Clothing Manipulation: Not assessed Toileting - Hygiene: Not assessed Where Assessed - Toileting Hygiene: Not assessed Tub/Shower Transfer: Not assessed Tub/Shower Transfer Method: Not assessed Equipment Used: Rolling walker Vision/Perception  Vision - History Baseline Vision: No visual deficits Patient Visual Report: No change from baseline Vision - Assessment Eye Alignment: Within Functional Limits Vision Assessment: Vision not tested [employer] Arousal/Alertness: Awake/alert Overall Cognitive Status: Appears within functional limits for tasks assessed; Pt. With minor slow to process and problem solving possibly due to decreased hearing and pain meds.  Orientation Level: Oriented X4 Sensation/Coordination Sensation Light Touch: Appears Intact Stereognosis: Not tested Hot/Cold: Not tested Proprioception: Not tested Extremity Assessment RUE Assessment RUE Assessment: Within Functional Limits LUE Assessment LUE Assessment: Within Functional Limits LLE BKA Mobility  Bed Mobility Bed Mobility: Yes Supine to Sit: 3: Mod assist;With rails Supine to Sit Details (indicate cue type and reason): Mod verbal cues for hand placement and technique Transfers Transfers: Yes Sit to Stand: 1: +2 Total assist;Patient percentage (comment);With upper extremity assist;From bed;From elevated surface (70% ) Sit  to Stand Details (indicate cue type and reason): Max verbal cues for technique and hand placement Stand to Sit: 1: +2 Total assist;Patient percentage (comment);To bed;To elevated surface (80%) Stand to Sit Details: vc's for hand placement on bed    End of Session OT - End of Session Equipment Utilized During Treatment: Gait belt Activity Tolerance: Patient tolerated treatment well Patient left: in chair;with call bell in reach;with family/visitor present Nurse Communication: Mobility status for transfers General Behavior During Session: [facility] for tasks performed   Todd Cook, OTR/L Pager: [phone] 09/27/2011, 9:09 AM

## 2011-09-27 NOTE — Progress Notes (Signed)
Physical Therapy Evaluation Patient Details Name: Todd Cook MRN: [id number] DOB: 1941/09/22 Today's Date: 09/27/2011  Problem List:  Patient Active Problem List  Diagnoses  . Non-STEMI (non-ST elevated myocardial infarction)  . S/P CABG x 4  . Osteomyelitis  . DM (diabetes mellitus) type I uncontrolled, periph vascular disorder  . Diverticulosis of colon  . S/P carotid endarterectomy  . Iron deficiency anemia  . Gout attack  . HTN (hypertension)  . Nephrolithiasis  . Cardiomyopathy, ischemic    Past Medical History:  Past Medical History  Diagnosis Date  . Blood transfusion   . Angina   . Chronic kidney disease   . Anemia   . Diabetes mellitus   . Peripheral vascular disease   . Hypertension   . Coronary artery disease   . Myocardial infarction   . Shortness of breath   . Gout     left foot   Past Surgical History:  Past Surgical History  Procedure Date  . 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 Knee arthroplasty     2000  . Cataract extraction, bilateral   . Eye surgery   . Cardiac catheterization     PT Assessment/Plan/Recommendation PT Assessment Clinical Impression Statement: Pt is 70 yo male with NSTEMI and now L BKA who was independent before hospitalization.  Pt is limited in activity tolerance and ability to mobilize because of new BKA. Pt lives at home with wife and has 24 hr assist (supervision level).  At this point, rec CIR for further rehab before returning home.  PT to follow acutely. PT Recommendation/Assessment: Patient will need skilled PT in the acute care venue PT Problem List: Decreased activity tolerance;Decreased balance;Decreased coordination;Decreased knowledge of use of DME;Decreased knowledge of precautions;Pain;Decreased mobility Barriers to Discharge: None Barriers to Discharge Comments: pt has good support but needs to be at supervision level PT Therapy Diagnosis : Difficulty walking;Acute pain PT Plan PT Frequency:  Min 3X/week PT Treatment/Interventions: DME instruction;Gait training;Functional mobility training;Therapeutic exercise;Balance training;Patient/family education PT Recommendation Recommendations for Other Services: Rehab consult Follow Up Recommendations: Inpatient Rehab Equipment Recommended: Defer to next venue PT Goals  Acute Rehab PT Goals PT Goal Formulation: With patient/family Time For Goal Achievement: 7 days Pt will Roll Supine to Right Side: with modified independence PT Goal: Rolling Supine to Right Side - Progress: Progressing toward goal Pt will Roll Supine to Left Side: with modified independence PT Goal: Rolling Supine to Left Side - Progress: Other (comment) (NT) Pt will go Supine/Side to Sit: with modified independence;with rail PT Goal: Supine/Side to Sit - Progress: Progressing toward goal Pt will go Sit to Supine/Side: with modified independence PT Goal: Sit to Supine/Side - Progress: Other (comment) (NT) Pt will Transfer Bed to Chair/Chair to Bed: with min assist PT Transfer Goal: Bed to Chair/Chair to Bed - Progress: Progressing toward goal Pt will Ambulate: 1 - 15 feet;with min assist;with rolling walker PT Goal: Ambulate - Progress: Progressing toward goal Pt will Perform Home Exercise Program: with supervision, verbal cues required/provided PT Goal: Perform Home Exercise Program - Progress: Progressing toward goal Additional Goals Additional Goal #1: Pt will verbalize proper positioning of LLE to maintain full ROM PT Goal: Additional Goal #1 - Progress: Progressing toward goal  PT Evaluation Precautions/Restrictions  Precautions Precautions: Fall Restrictions Weight Bearing Restrictions: No Prior Functioning  Home Living Lives With: Spouse Receives Help From: Family Type of Home: House Home Layout: One level Home Access: Stairs to enter [employer] of Steps: 1 Bathroom  Shower/Tub: Tub/shower unit;Door [employer] Toilet: Standard Bathroom  Accessibility: Yes How Accessible: Accessible via walker Home Adaptive Equipment: Shower chair with back Prior Function Level of Independence: Independent with basic ADLs;Independent with homemaking with ambulation;Independent with homemaking with wheelchair;Independent with gait Able to Take Stairs?: Yes Driving: Yes Vocation: Retired Producer, television/film/video: Awake/alert Overall Cognitive Status: Appears within functional limits for tasks assessed Orientation Level: Oriented X4 Sensation/Coordination Sensation Light Touch: Appears Intact Stereognosis: Not tested Hot/Cold: Not tested Proprioception: Not tested Extremity Assessment RUE Assessment RUE Assessment: Within Functional Limits LUE Assessment LUE Assessment: Within Functional Limits RLE Assessment RLE Assessment: Within Functional Limits LLE Assessment LLE Assessment: Within Functional Limits (hip and knee) Mobility (including Balance) Bed Mobility Bed Mobility: Yes Supine to Sit: 3: Mod assist;With rails Supine to Sit Details (indicate cue type and reason): Mod verbal cues for hand placement and technique Transfers Transfers: Yes Sit to Stand: 1: +2 Total assist;Patient percentage (comment);With upper extremity assist;From bed;From elevated surface (70% ) Sit to Stand Details (indicate cue type and reason): Max verbal cues for technique and hand placement Stand to Sit: 1: +2 Total assist;Patient percentage (comment);To bed;To elevated surface (80%) Stand to Sit Details: vc's for hand placement on bed Squat Pivot Transfers: 1: +2 Total assist;Patient percentage (comment);From elevated surface;With armrests (pt 80%) Squat Pivot Transfer Details (indicate cue type and reason): pt felt much more confortable with squat-pivot transfer than full stand.  Educated pt on chair set-up to right for ease of transfers Ambulation/Gait Ambulation/Gait: Yes Ambulation/Gait Assistance: 3: Mod assist Ambulation/Gait  Assistance Details (indicate cue type and reason): 1 hop fwd and one hop bkwd to practice taking full wt through upper extremities on RW Ambulation Distance (Feet): 1 Feet Assistive device: Rolling walker Gait Pattern:  (single leg hop) Stairs: No Wheelchair Mobility Wheelchair Mobility: No  Posture/Postural Control Posture/Postural Control: Postural limitations Postural Limitations: pt adjusting to decreased wt on the left side due to BKA Balance Balance Assessed: Yes Static Sitting Balance Static Sitting - Level of Assistance: 5: Stand by assistance (pt experienced dizziness with initial sitting, decr c time) Static Standing Balance Static Standing - Level of Assistance: 1: +2 Total assist;Patient percentage (comment) (70%, dizziness initially) Exercise  Amputee Exercises Quad Sets: AROM;Strengthening;Left;10 reps;Supine;Seated Straight Leg Raises: AROM;Strengthening;Left;10 reps;Supine End of Session PT - End of Session Equipment Utilized During Treatment: Gait belt Activity Tolerance: Patient tolerated treatment well Patient left: in chair;with call bell in reach;with family/visitor present Nurse Communication: Mobility status for transfers General Behavior During Session: [facility] for tasks performed Cognition: Impaired Cognitive Impairment: pt slow to process, could be due to [facility] &/ or pain meds  Detrich Rakestraw, Benetta Spar  [phone] 09/27/2011, 9:26 AM

## 2011-09-28 ENCOUNTER — Encounter (HOSPITAL_COMMUNITY): Payer: Self-pay | Admitting: Vascular Surgery

## 2011-09-28 LAB — BASIC METABOLIC PANEL
CO2: 20 mEq/L (ref 19–32)
Calcium: 7.5 mg/dL — ABNORMAL LOW (ref 8.4–10.5)
Chloride: 102 mEq/L (ref 96–112)
Glucose, Bld: 195 mg/dL — ABNORMAL HIGH (ref 70–99)
Potassium: 3.8 mEq/L (ref 3.5–5.1)
Sodium: 132 mEq/L — ABNORMAL LOW (ref 135–145)

## 2011-09-28 LAB — CBC
HCT: 19.5 % — ABNORMAL LOW (ref 39.0–52.0)
Hemoglobin: 6.7 g/dL — CL (ref 13.0–17.0)
MCH: 29.6 pg (ref 26.0–34.0)
MCV: 86.3 fL (ref 78.0–100.0)
RBC: 2.26 MIL/uL — ABNORMAL LOW (ref 4.22–5.81)
WBC: 10.3 10*3/uL (ref 4.0–10.5)

## 2011-09-28 LAB — GLUCOSE, CAPILLARY
Glucose-Capillary: 171 mg/dL — ABNORMAL HIGH (ref 70–99)
Glucose-Capillary: 196 mg/dL — ABNORMAL HIGH (ref 70–99)

## 2011-09-28 MED ORDER — ACETAMINOPHEN 325 MG PO TABS: 650.0000 mg | ORAL_TABLET | Freq: Once | ORAL | Status: DC

## 2011-09-28 MED ORDER — FUROSEMIDE 10 MG/ML IJ SOLN: 20.0000 mg | Freq: Once | INTRAMUSCULAR | Status: DC

## 2011-09-28 MED ORDER — DIPHENHYDRAMINE HCL 25 MG PO CAPS
25.0000 mg | ORAL_CAPSULE | Freq: Once | ORAL | Status: AC
  Administered 2011-09-28: 25 mg via ORAL
  Filled 2011-09-28: qty 1

## 2011-09-28 MED ORDER — GLIMEPIRIDE 2 MG PO TABS
2.0000 mg | ORAL_TABLET | Freq: Two times a day (BID) | ORAL | Status: DC
  Administered 2011-09-28 – 2011-09-29 (×2): 2 mg via ORAL
  Filled 2011-09-28 (×4): qty 1

## 2011-09-28 MED ORDER — POTASSIUM CHLORIDE CRYS ER 20 MEQ PO TBCR
20.0000 meq | EXTENDED_RELEASE_TABLET | Freq: Once | ORAL | Status: AC
  Administered 2011-09-28: 20 meq via ORAL
  Filled 2011-09-28: qty 1

## 2011-09-28 MED ORDER — FUROSEMIDE 10 MG/ML IJ SOLN
20.0000 mg | Freq: Once | INTRAMUSCULAR | Status: AC
  Administered 2011-09-28: 20 mg via INTRAVENOUS
  Filled 2011-09-28: qty 2

## 2011-09-28 MED ORDER — INSULIN GLARGINE 100 UNIT/ML ~~LOC~~ SOLN
5.0000 [IU] | Freq: Every day | SUBCUTANEOUS | Status: DC
  Administered 2011-09-28: 5 [IU] via SUBCUTANEOUS
  Filled 2011-09-28: qty 3

## 2011-09-28 NOTE — Progress Notes (Signed)
I await insurance approval to admit patient to CIR tomorrow. Pager [phone]

## 2011-09-28 NOTE — Progress Notes (Signed)
Utilization Review Completed.Todd Cook T11/15/2012   

## 2011-09-28 NOTE — Progress Notes (Signed)
Subjective: Has no major complain, waiting for CIR to approve admission  Objective: Vital signs in last 24 hours: Filed Vitals:   09/28/11 1306 09/28/11 1329 09/28/11 1344 09/28/11 1415  BP: 121/56  134/57 131/58  Pulse: 65  66 63  Temp: 97.3 F (36.3 C) 97 F (36.1 C) 97.5 F (36.4 C) 97 F (36.1 C)  TempSrc: Oral Oral Oral Oral  Resp: 24  23 20   Height:      Weight:      SpO2:       Weight change:   Intake/Output Summary (Last 24 hours) at 09/28/11 1425 Last data filed at 09/28/11 1255  Gross per 24 hour  Intake 2553.08 ml  Output    890 ml  Net 1663.08 ml   On EXAM General: Alert, awake, oriented x3, in no acute distress.  HEENT: No bruits, no goiter.  Heart: Regular rate and rhythm, without murmurs, rubs, gallops.  Lungs: , bilateral air movement, no wheezes or ronchy.  Abdomen: Soft, nontender, nondistended, positive bowel sounds.  Neuro: Grossly intact, nonfocal.  Extremities: SP LEFT BELOW KNEE AMBPUTATION       Lab Results:  [facility] 09/28/11 0540 09/27/11 1718  NA 132* 127*  K 3.8 4.3  CL 102 98  CO2 20 17*  GLUCOSE 195* 421*  BUN 53* 53*  CREATININE 2.16* 2.20*  CALCIUM 7.5* 7.6*  MG -- --  PHOS -- --    Micro Results: Recent Results (from the past 240 hour(s))  CULTURE, BLOOD (ROUTINE X 2)     Status: Normal   Collection Time   09/18/11  2:38 PM      Component Value Range Status Comment   Specimen Description BLOOD ARM LEFT   Final    Special Requests BOTTLES DRAWN AEROBIC ONLY 10CC   Final    Setup Time [id number]   Final    Culture     Final    Value: VIRIDANS STREPTOCOCCUS     Note: Gram Stain Report Called to,Read Back By and Verified With: CHRISTIAN TOOENTINO 09/19/11 @ 1822 HAJAM   Report Status 09/21/2011 FINAL   Final   CULTURE, BLOOD (ROUTINE SINGLE)     Status: Normal   Collection Time   09/18/11  4:07 PM      Component Value Range Status Comment   Specimen Description BLOOD ARM RIGHT   Final    Special Requests BOTTLES  DRAWN AEROBIC AND ANAEROBIC [facility]   Final    Setup Time [id number]   Final    Culture NO GROWTH 5 DAYS   Final    Report Status 09/24/2011 FINAL   Final   MRSA PCR SCREENING     Status: Normal   Collection Time   09/18/11  8:59 PM      Component Value Range Status Comment   MRSA by PCR NEGATIVE  NEGATIVE  Final   URINE CULTURE     Status: Normal   Collection Time   09/19/11  8:18 AM      Component Value Range Status Comment   Specimen Description URINE, CLEAN CATCH   Final    Special Requests NONE   Final    Setup Time [id number]   Final    Colony Count NO GROWTH   Final    Culture NO GROWTH   Final    Report Status 09/20/2011 FINAL   Final   SURGICAL PCR SCREEN     Status: Normal   Collection Time   09/25/11  5:01 PM      Component Value Range Status Comment   MRSA, PCR NEGATIVE  NEGATIVE  Final    Staphylococcus aureus NEGATIVE  NEGATIVE  Final   CULTURE, BLOOD (ROUTINE X 2)     Status: Normal (Preliminary result)   Collection Time   09/27/11 12:45 PM      Component Value Range Status Comment   Specimen Description BLOOD RIGHT ANTECUBITAL   Final    Special Requests BOTTLES DRAWN AEROBIC ONLY 8CC   Final    Setup Time [id number]   Final    Culture     Final    Value:        BLOOD CULTURE RECEIVED NO GROWTH TO DATE CULTURE WILL BE HELD FOR 5 DAYS BEFORE ISSUING A FINAL NEGATIVE REPORT   Report Status PENDING   Incomplete   CULTURE, BLOOD (ROUTINE X 2)     Status: Normal (Preliminary result)   Collection Time   09/27/11 12:55 PM      Component Value Range Status Comment   Specimen Description BLOOD RIGHT HAND   Final    Special Requests BOTTLES DRAWN AEROBIC ONLY 8CC   Final    Setup Time [id number]   Final    Culture     Final    Value:        BLOOD CULTURE RECEIVED NO GROWTH TO DATE CULTURE WILL BE HELD FOR 5 DAYS BEFORE ISSUING A FINAL NEGATIVE REPORT   Report Status PENDING   Incomplete     Studies/Results: Dg Chest 2 View  09/18/2011  *RADIOLOGY REPORT*   Clinical Data: Diabetic left foot ulcerations/abscess.  Prior CABG.  CHEST - 2 VIEW 09/18/2011:  Comparison: None.  Findings: Prior sternotomy for CABG.  Cardiac silhouette mildly enlarged.  Thoracic aorta tortuous and atherosclerotic.  Hilar and mediastinal contours otherwise unremarkable.  Suboptimal inspiration.  Pulmonary venous hypertension without overt edema. Lungs clear.  No pleural effusions.  Degenerative changes and DISH involving the thoracic spine.  IMPRESSION: Suboptimal inspiration.  Mild cardiomegaly.  Pulmonary venous hypertension without overt edema.  No acute cardiopulmonary disease.  Original Report Authenticated By: Arnell Sieving, M.D.   Mr Foot Left Wo Contrast  09/24/2011  *RADIOLOGY REPORT*  Clinical Data: Left foot infection with draining wound on the medial aspect of the left foot.  MRI OF THE LEFT FOREFOOT WITHOUT CONTRAST  Technique:  Multiplanar, multisequence MR imaging was performed. No intravenous contrast was administered.  Comparison: Radiographs dated 09/18/2011  Findings: There is extensive pus in and around the first metatarsal phalangeal joint extending into the soft tissues of the distal great toe particularly medially.  This fluid signal intensity extends into the webspace between the first and second toes and extends into the atrophic muscles of the plantar aspect of the foot almost to the base of the second metatarsal.  The fluid extends under the extensor tendon of the second toe and there is fluid in the second metatarsal phalangeal joint. This is probably infected as well.  There is slight edema in the medial and lateral sesamoids adjacent to the head of the first metatarsal and there is subtle edema in the head and shaft of the first metatarsal suggesting osteomyelitis.  Low signal intensity is seen in the soft tissues of the great toe consistent with gas seen on the radiographs.  IMPRESSION:  1.  Septic first metatarsal phalangeal joint. 2.  Osteomyelitis  involving the head and shaft of the first metatarsal. 3.  Pus extending into  the soft tissues of the great toe and into the plantar muscles under the first and second metatarsals. 3.  Small effusion at the second metatarsal phalangeal joint with fluid under the extensor tendon which probably represents infection as well.  Original Report Authenticated By: Gwynn Burly, M.D.   Dg Chest Portable 1 View  09/20/2011  *RADIOLOGY REPORT*  Clinical Data: Shortness of breath.  PORTABLE CHEST - 1 VIEW  Comparison: None.  Findings: Cardiomegaly.  Central vascular congestion.  Interval development of right greater than left airspace opacities.  Status post median sternotomy and CABG.  No acute osseous abnormality.  No pneumothorax.  There may be small pleural effusions.  IMPRESSION: Interval development of right greater than left airspace opacities. The rapid onset favors a process such as asymmetric edema.  Original Report Authenticated By: Waneta Martins, M.D.   Dg Foot Complete Left  09/18/2011  *RADIOLOGY REPORT*  Clinical Data: Ulcer at plantar aspect left foot with abscess  LEFT FOOT - COMPLETE 3+ VIEW  Comparison: None  Findings: Marked osseous demineralization. Scattered small vessel atherosclerotic calcifications. Joint spaces preserved. Soft tissue gas at great toe. Assessment of cortical integrity is suboptimal due to degree of bony demineralization. No definite acute fracture, dislocation or bone destruction identified. It is difficult to exclude destruction at the lateral aspect of the first metatarsal head due to degree of demineralization.  IMPRESSION: Soft tissue gas at great toe consistent with infection. No definite acute bony abnormalities identified, though the head of the first metatarsal is difficult to assess. If osteomyelitis is clinically suspected, or assessment of extent of soft tissue infection is required, recommend MR imaging with and without contrast.  Original Report Authenticated  By: Lollie Marrow, M.D.    Medications: I have reviewed the patient's current medications. Scheduled Meds:   . allopurinol  300 mg Oral Daily  . aspirin EC  81 mg Oral Daily  . clopidogrel  75 mg Oral Q breakfast  . docusate sodium  100 mg Oral Daily  . enoxaparin  40 mg Subcutaneous Q24H  . famotidine  20 mg Oral Daily  . glimepiride  2 mg Oral Q breakfast  . hydrALAZINE  10 mg Oral BID  . insulin aspart  0-20 Units Subcutaneous TID WC  . insulin aspart  0-5 Units Subcutaneous QHS  . insulin glargine  10 Units Subcutaneous QHS  . linagliptin  5 mg Oral Daily  . living well with diabetes book   Does not apply Once  . metoprolol tartrate  50 mg Oral BID  . rosuvastatin  10 mg Oral Daily  . sodium chloride  3 mL Intravenous Q12H  . DISCONTD: hydrALAZINE  10 mg Oral Q8H  . DISCONTD: insulin glargine  5 Units Subcutaneous QHS  . DISCONTD: piperacillin-tazobactam (ZOSYN)  IV  3.375 g Intravenous Q8H  . DISCONTD: vancomycin  1,500 mg Intravenous Q24H   Continuous Infusions:   . sodium chloride 20 mL/hr at 09/28/11 0500   PRN Meds:.acetaminophen, acetaminophen, acetaminophen, ALPRAZolam, alum & mag hydroxide-simeth, bisacodyl, dextrose, guaiFENesin-dextromethorphan, hydrALAZINE, HYDROmorphone (DILAUDID) injection, labetalol, magnesium hydroxide, metoprolol, nitroGLYCERIN, ondansetron (ZOFRAN) IV, oxyCODONE-acetaminophen, phenol, polyethylene glycol, sodium chloride, sodium chloride  Assessment/Plan: Principal Problem: 1- anemia: hemoglobin drop to 6.7 will type and cross, and transfuse 2 unit of PRBCS,will check stool guaiac, likely multifactorial secondary to anemia of chronic disease and acute blood loss anemia. 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 We'll likely DC antibiotic at the time of discharge, per  Dr. Jorene Minors case is discussed with Dr. Maurice March ID he suspected, one out of 2 blood cultures Streptococcus viridans is contamination, blood culture pending , but no evidence of infection to date.  DM (diabetes mellitus) type I uncontrol: Would resume the patient's home dose of ,amaryl ,linagilptine   Diverticulosis of colon: stable  S/P carotid endarterectomy  Iron deficiency anemia: may need blood transfusion if drop below 8  Non anion gap metabolic acidosis : secondary to renal insuffiency  CKD stage 3: stable mild elevated creatinine . Will check in am  Disposition the CIR IF accepted      LOS: 10 days  Cira Deyoe I. 09/28/2011, 2:25 PM

## 2011-09-28 NOTE — Progress Notes (Signed)
[facility]  Subjective  - POD #2 left BKA, not much pain, no phantom pain  Objective 116/62 60 98.7 F (37.1 C) (Oral) 18 98%  Intake/Output Summary (Last 24 hours) at 09/28/11 8413 Last data filed at 09/28/11 0500  Gross per 24 hour  Intake 2468.08 ml  Output    500 ml  Net 1968.08 ml   Left BKA incision clean and healing so far  Assessment/Planning: Healing BKA Wrap stump daily Ace Kerlix Ok for d/c from my stdpt.  Derwood Becraft E 09/28/2011 8:28 AM --  Laboratory Lab Results:  Basename 09/28/11 0540 09/27/11 0642  WBC 10.3 14.0*  HGB 6.7* 8.2*  HCT 19.5* 24.8*  PLT 233 279   BMET  Basename 09/28/11 0540 09/27/11 1718  NA 132* 127*  K 3.8 4.3  CL 102 98  CO2 20 17*  GLUCOSE 195* 421*  BUN 53* 53*  CREATININE 2.16* 2.20*  CALCIUM 7.5* 7.6*    COAG Lab Results  Component Value Date   INR 1.25 09/19/2011   No results found for this basename: PTT    Antibiotics Anti-infectives     Start     Dose/Rate Route Frequency Ordered Stop   09/26/11 2230   cefUROXime (ZINACEF) 1.5 g in dextrose 5 % 50 mL IVPB  Status:  Discontinued        1.5 g 100 mL/hr over 30 Minutes Intravenous Every 12 hours 09/26/11 2226 09/26/11 2316   09/24/11 1300   vancomycin (VANCOCIN) 1,500 mg in sodium chloride 0.9 % 500 mL IVPB  Status:  Discontinued        1,500 mg 250 mL/hr over 120 Minutes Intravenous Every 24 hours 09/24/11 1136 09/27/11 1822   09/19/11 1000   vancomycin (VANCOCIN) 1,500 mg in sodium chloride 0.9 % 500 mL IVPB  Status:  Discontinued        1,500 mg 250 mL/hr over 120 Minutes Intravenous Every 24 hours 09/18/11 1733 09/18/11 2315   09/18/11 2300   piperacillin-tazobactam (ZOSYN) IVPB 3.375 g  Status:  Discontinued        3.375 g 12.5 mL/hr over 240 Minutes Intravenous 3 times per day 09/18/11 1733 09/27/11 1822   09/18/11 2000   vancomycin (VANCOCIN) 1,500 mg in sodium chloride 0.9 % 500 mL IVPB  Status:   Discontinued        1,500 mg 250 mL/hr over 120 Minutes Intravenous Every 24 hours 09/18/11 1946 09/21/11 1509   09/18/11 1500  piperacillin-tazobactam (ZOSYN) IVPB 3.375 g       3.375 g 100 mL/hr over 30 Minutes Intravenous  Once 09/18/11 1431 09/22/11 1430   09/18/11 1445   vancomycin (VANCOCIN) IVPB 1000 mg/200 mL premix  Status:  Discontinued        1,000 mg 200 mL/hr over 60 Minutes Intravenous  Once 09/18/11 1431 09/18/11 1945

## 2011-09-29 ENCOUNTER — Inpatient Hospital Stay (HOSPITAL_COMMUNITY)
Admission: RE | Admit: 2011-09-29 | Discharge: 2011-10-07 | DRG: 946 | Disposition: A | Discharge status: Home Health | Payer: Medicare PPO | Source: Ambulatory Visit | Attending: Physical Medicine & Rehabilitation | Admitting: Physical Medicine & Rehabilitation

## 2011-09-29 DIAGNOSIS — I2589 Other forms of chronic ischemic heart disease: Secondary | ICD-10-CM | POA: Diagnosis present

## 2011-09-29 DIAGNOSIS — Z7902 Long term (current) use of antithrombotics/antiplatelets: Secondary | ICD-10-CM

## 2011-09-29 DIAGNOSIS — Z89519 Acquired absence of unspecified leg below knee: Secondary | ICD-10-CM

## 2011-09-29 DIAGNOSIS — Z5189 Encounter for other specified aftercare: Principal | ICD-10-CM

## 2011-09-29 DIAGNOSIS — Z951 Presence of aortocoronary bypass graft: Secondary | ICD-10-CM

## 2011-09-29 DIAGNOSIS — S88119A Complete traumatic amputation at level between knee and ankle, unspecified lower leg, initial encounter: Secondary | ICD-10-CM

## 2011-09-29 DIAGNOSIS — M109 Gout, unspecified: Secondary | ICD-10-CM | POA: Diagnosis present

## 2011-09-29 DIAGNOSIS — I739 Peripheral vascular disease, unspecified: Secondary | ICD-10-CM

## 2011-09-29 DIAGNOSIS — Z7982 Long term (current) use of aspirin: Secondary | ICD-10-CM

## 2011-09-29 DIAGNOSIS — I70269 Atherosclerosis of native arteries of extremities with gangrene, unspecified extremity: Secondary | ICD-10-CM

## 2011-09-29 DIAGNOSIS — Z96659 Presence of unspecified artificial knee joint: Secondary | ICD-10-CM

## 2011-09-29 DIAGNOSIS — I2510 Atherosclerotic heart disease of native coronary artery without angina pectoris: Secondary | ICD-10-CM | POA: Diagnosis present

## 2011-09-29 DIAGNOSIS — N183 Chronic kidney disease, stage 3 unspecified: Secondary | ICD-10-CM | POA: Diagnosis present

## 2011-09-29 DIAGNOSIS — D509 Iron deficiency anemia, unspecified: Secondary | ICD-10-CM | POA: Diagnosis present

## 2011-09-29 DIAGNOSIS — I129 Hypertensive chronic kidney disease with stage 1 through stage 4 chronic kidney disease, or unspecified chronic kidney disease: Secondary | ICD-10-CM | POA: Diagnosis present

## 2011-09-29 DIAGNOSIS — E1059 Type 1 diabetes mellitus with other circulatory complications: Secondary | ICD-10-CM | POA: Diagnosis present

## 2011-09-29 DIAGNOSIS — I798 Other disorders of arteries, arterioles and capillaries in diseases classified elsewhere: Secondary | ICD-10-CM | POA: Diagnosis present

## 2011-09-29 HISTORY — DX: Peripheral vascular disease, unspecified: I73.9

## 2011-09-29 LAB — CBC
MCV: 87.7 fL (ref 78.0–100.0)
Platelets: 305 10*3/uL (ref 150–400)
RBC: 3.16 MIL/uL — ABNORMAL LOW (ref 4.22–5.81)
RDW: 14 % (ref 11.5–15.5)
WBC: 8 10*3/uL (ref 4.0–10.5)

## 2011-09-29 LAB — TYPE AND SCREEN
ABO/RH(D): A POS
Antibody Screen: NEGATIVE
Unit division: 0
Unit division: 0

## 2011-09-29 LAB — GLUCOSE, CAPILLARY
Glucose-Capillary: 147 mg/dL — ABNORMAL HIGH (ref 70–99)
Glucose-Capillary: 200 mg/dL — ABNORMAL HIGH (ref 70–99)

## 2011-09-29 MED ORDER — METOPROLOL TARTRATE 50 MG PO TABS
50.0000 mg | ORAL_TABLET | Freq: Two times a day (BID) | ORAL | Status: DC
  Administered 2011-09-29 – 2011-10-07 (×16): 50 mg via ORAL
  Filled 2011-09-29 (×19): qty 1

## 2011-09-29 MED ORDER — GLIMEPIRIDE 2 MG PO TABS
2.0000 mg | ORAL_TABLET | Freq: Two times a day (BID) | ORAL | Status: DC
  Administered 2011-09-29 – 2011-10-07 (×16): 2 mg via ORAL
  Filled 2011-09-29 (×18): qty 1

## 2011-09-29 MED ORDER — PROMETHAZINE HCL 25 MG/ML IJ SOLN: 12.5000 mg | Freq: Four times a day (QID) | INTRAMUSCULAR | Status: DC | PRN

## 2011-09-29 MED ORDER — ENOXAPARIN SODIUM 40 MG/0.4ML ~~LOC~~ SOLN
40.0000 mg | SUBCUTANEOUS | Status: DC
  Filled 2011-09-29: qty 0.4

## 2011-09-29 MED ORDER — TRAMADOL HCL 50 MG PO TABS: 50.0000 mg | ORAL_TABLET | Freq: Four times a day (QID) | ORAL | Status: DC | PRN

## 2011-09-29 MED ORDER — DIPHENHYDRAMINE HCL 12.5 MG/5ML PO ELIX: 12.5000 mg | ORAL_SOLUTION | Freq: Four times a day (QID) | ORAL | Status: DC | PRN

## 2011-09-29 MED ORDER — ENOXAPARIN SODIUM 40 MG/0.4ML ~~LOC~~ SOLN
40.0000 mg | SUBCUTANEOUS | Status: DC
  Administered 2011-09-29 – 2011-10-06 (×8): 40 mg via SUBCUTANEOUS
  Filled 2011-09-29 (×9): qty 0.4

## 2011-09-29 MED ORDER — GUAIFENESIN-DM 100-10 MG/5ML PO SYRP: 15.0000 mL | ORAL_SOLUTION | ORAL | Status: DC | PRN

## 2011-09-29 MED ORDER — SORBITOL 70 % SOLN: 30.0000 mL | Freq: Two times a day (BID) | Status: DC | PRN

## 2011-09-29 MED ORDER — BISACODYL 10 MG RE SUPP: 10.0000 mg | Freq: Every day | RECTAL | Status: DC | PRN

## 2011-09-29 MED ORDER — DOCUSATE SODIUM 100 MG PO CAPS
100.0000 mg | ORAL_CAPSULE | Freq: Every day | ORAL | Status: DC
  Administered 2011-09-30 – 2011-10-07 (×8): 100 mg via ORAL
  Filled 2011-09-29 (×9): qty 1

## 2011-09-29 MED ORDER — OXYCODONE-ACETAMINOPHEN 5-325 MG PO TABS: 1.0000 | ORAL_TABLET | ORAL | Status: DC | PRN

## 2011-09-29 MED ORDER — NITROGLYCERIN 0.4 MG SL SUBL: 0.4000 mg | SUBLINGUAL_TABLET | SUBLINGUAL | Status: DC | PRN

## 2011-09-29 MED ORDER — INSULIN ASPART 100 UNIT/ML ~~LOC~~ SOLN
0.0000 [IU] | Freq: Three times a day (TID) | SUBCUTANEOUS | Status: DC
  Administered 2011-09-30: 4 [IU] via SUBCUTANEOUS
  Administered 2011-09-30: 15 [IU] via SUBCUTANEOUS
  Administered 2011-09-30: 7 [IU] via SUBCUTANEOUS
  Administered 2011-10-01 (×2): 4 [IU] via SUBCUTANEOUS
  Administered 2011-10-01: 7 [IU] via SUBCUTANEOUS
  Administered 2011-10-02: 11 [IU] via SUBCUTANEOUS
  Administered 2011-10-02 (×2): 4 [IU] via SUBCUTANEOUS
  Administered 2011-10-03: 7 [IU] via SUBCUTANEOUS
  Administered 2011-10-03: 3 [IU] via SUBCUTANEOUS
  Administered 2011-10-04: 7 [IU] via SUBCUTANEOUS
  Administered 2011-10-04: 4 [IU] via SUBCUTANEOUS
  Administered 2011-10-04: 3 [IU] via SUBCUTANEOUS
  Administered 2011-10-05: 4 [IU] via SUBCUTANEOUS
  Administered 2011-10-05: 3 [IU] via SUBCUTANEOUS
  Administered 2011-10-05: 7 [IU] via SUBCUTANEOUS
  Administered 2011-10-06 (×2): 3 [IU] via SUBCUTANEOUS
  Administered 2011-10-06: 7 [IU] via SUBCUTANEOUS
  Administered 2011-10-07: 3 [IU] via SUBCUTANEOUS
  Filled 2011-09-29: qty 3

## 2011-09-29 MED ORDER — ALPRAZOLAM 0.25 MG PO TABS: 0.2500 mg | ORAL_TABLET | Freq: Two times a day (BID) | ORAL | Status: DC | PRN

## 2011-09-29 MED ORDER — POTASSIUM CHLORIDE CRYS ER 20 MEQ PO TBCR
20.0000 meq | EXTENDED_RELEASE_TABLET | Freq: Once | ORAL | Status: AC
  Administered 2011-09-29: 20 meq via ORAL
  Filled 2011-09-29: qty 1

## 2011-09-29 MED ORDER — ASPIRIN EC 81 MG PO TBEC
81.0000 mg | DELAYED_RELEASE_TABLET | Freq: Every day | ORAL | Status: DC
  Administered 2011-09-30 – 2011-10-07 (×8): 81 mg via ORAL
  Filled 2011-09-29 (×9): qty 1

## 2011-09-29 MED ORDER — METHOCARBAMOL 500 MG PO TABS: 500.0000 mg | ORAL_TABLET | Freq: Four times a day (QID) | ORAL | Status: DC | PRN

## 2011-09-29 MED ORDER — ACETAMINOPHEN 325 MG PO TABS
325.0000 mg | ORAL_TABLET | ORAL | Status: DC | PRN
  Administered 2011-10-04: 650 mg via ORAL
  Filled 2011-09-29: qty 2

## 2011-09-29 MED ORDER — ROSUVASTATIN CALCIUM 10 MG PO TABS
10.0000 mg | ORAL_TABLET | Freq: Every day | ORAL | Status: DC
  Administered 2011-09-30 – 2011-10-07 (×8): 10 mg via ORAL
  Filled 2011-09-29 (×9): qty 1

## 2011-09-29 MED ORDER — INSULIN ASPART 100 UNIT/ML ~~LOC~~ SOLN
0.0000 [IU] | Freq: Every day | SUBCUTANEOUS | Status: DC
  Administered 2011-09-29 – 2011-10-05 (×2): 2 [IU] via SUBCUTANEOUS

## 2011-09-29 MED ORDER — OXYCODONE-ACETAMINOPHEN 5-325 MG PO TABS
1.0000 | ORAL_TABLET | ORAL | Status: DC | PRN
  Administered 2011-09-30: 1 via ORAL
  Administered 2011-10-01: 2 via ORAL
  Administered 2011-10-01: 1 via ORAL
  Administered 2011-10-02: 2 via ORAL
  Administered 2011-10-03 – 2011-10-06 (×2): 1 via ORAL
  Filled 2011-09-29: qty 2
  Filled 2011-09-29: qty 1
  Filled 2011-09-29: qty 2
  Filled 2011-09-29: qty 1
  Filled 2011-09-29 (×2): qty 2

## 2011-09-29 MED ORDER — MAGNESIUM HYDROXIDE 400 MG/5ML PO SUSP
30.0000 mL | ORAL | Status: DC | PRN
  Administered 2011-10-03: 30 mL via ORAL
  Filled 2011-09-29: qty 30

## 2011-09-29 MED ORDER — ALUM & MAG HYDROXIDE-SIMETH 400-400-40 MG/5ML PO SUSP
30.0000 mL | ORAL | Status: DC | PRN
  Filled 2011-09-29: qty 30

## 2011-09-29 MED ORDER — PHENOL 1.4 % MT LIQD
1.0000 | OROMUCOSAL | Status: DC | PRN
  Filled 2011-09-29: qty 177

## 2011-09-29 MED ORDER — CLOPIDOGREL BISULFATE 75 MG PO TABS
75.0000 mg | ORAL_TABLET | Freq: Every day | ORAL | Status: DC
  Administered 2011-09-30 – 2011-10-07 (×8): 75 mg via ORAL
  Filled 2011-09-29 (×9): qty 1

## 2011-09-29 MED ORDER — FAMOTIDINE 20 MG PO TABS
20.0000 mg | ORAL_TABLET | Freq: Every day | ORAL | Status: DC
  Administered 2011-09-29 – 2011-10-07 (×9): 20 mg via ORAL
  Filled 2011-09-29 (×12): qty 1

## 2011-09-29 MED ORDER — GUAIFENESIN-DM 100-10 MG/5ML PO SYRP: 5.0000 mL | ORAL_SOLUTION | Freq: Four times a day (QID) | ORAL | Status: DC | PRN

## 2011-09-29 MED ORDER — ALLOPURINOL 300 MG PO TABS
300.0000 mg | ORAL_TABLET | Freq: Every day | ORAL | Status: DC
  Administered 2011-09-30 – 2011-10-07 (×8): 300 mg via ORAL
  Filled 2011-09-29 (×9): qty 1

## 2011-09-29 MED ORDER — PROMETHAZINE HCL 12.5 MG RE SUPP: 12.5000 mg | Freq: Four times a day (QID) | RECTAL | Status: DC | PRN

## 2011-09-29 MED ORDER — PROMETHAZINE HCL 12.5 MG PO TABS: 12.5000 mg | ORAL_TABLET | Freq: Four times a day (QID) | ORAL | Status: DC | PRN

## 2011-09-29 MED ORDER — LINAGLIPTIN 5 MG PO TABS
5.0000 mg | ORAL_TABLET | Freq: Every day | ORAL | Status: DC
  Administered 2011-09-30 – 2011-10-07 (×8): 5 mg via ORAL
  Filled 2011-09-29 (×10): qty 1

## 2011-09-29 MED ORDER — POLYETHYLENE GLYCOL 3350 17 G PO PACK
17.0000 g | PACK | Freq: Every day | ORAL | Status: DC | PRN
  Administered 2011-10-04: 17 g via ORAL
  Filled 2011-09-29 (×3): qty 1

## 2011-09-29 MED ORDER — TRAZODONE HCL 50 MG PO TABS
50.0000 mg | ORAL_TABLET | Freq: Every day | ORAL | Status: DC
  Administered 2011-09-29 – 2011-10-06 (×8): 50 mg via ORAL
  Filled 2011-09-29 (×8): qty 1

## 2011-09-29 NOTE — Progress Notes (Signed)
Insurance has approved inpatient rehabilitation admit for today. I have notified primary team through [facility], and family. Plan admit today. Please call pager [phone] for questions.

## 2011-09-29 NOTE — Discharge Summary (Signed)
DISCHARGE SUMMARY  Todd Cook  MR#: [id number]  DOB:1940/12/28  Date of Admission: 09/18/2011 Date of Discharge: 09/29/2011  Attending Physician:Maggy Wyble I.  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      aspirin EC 81 MG tablet Take 81 mg by mouth daily.      colchicine 0.6 MG tablet Take 0.6 mg by mouth daily.      glimepiride (AMARYL) 4 MG tablet Take 4 mg by mouth 2 (two) times daily.      losartan (COZAAR) 100 MG tablet Take 100 mg by mouth daily.      simvastatin (ZOCOR) 20 MG tablet Take 20 mg by mouth at bedtime.      sitaGLIPtin (JANUVIA) 100 MG tablet Take 100 mg by mouth daily.    plavix 75 mg po daily  Nitroglycerin sublingual,4mg  every mints PRN HYDRALAZINE 10 MG PO BID  CRESTOR10 MG PO DAILY KDUR 10 MEQ PO DAILY  Hospital Course: This is a 70 year old causation male who presented as direct admission from Dr. Nila Nephew for feeling unwell, he felt shaky and nervous in the morning ,he was complaining of chest pain, he also hadworsening redness and swelling over his right lower extremity he had extensive debridement of his  right lower extremity and antiemetic he had Doppler ultrasound ultrasound of his right lower extremity which it did show no evidence of significant stenosis in the right lower extremity but that is as he knows his of the left lower extremity at the distal popliteal artery.   1-Non-STEMI (non-ST elevated myocardial infarction) - S/P CABG x 4  underwent PTCA and stent placement with a drug-eluting stent to one of his grafted vessels. To continue plavix and aspirin.   2-Paroxysmal AFIB- noted on 11/8 but now sinus rhythm. On Metoprolol 50 mg BID.  He is on ASA and Plavix for MI.per cardiology need for coumadin will be address as out patient. Please arrange follow up with DR Clifton James at discharge. Currently on Aspirin and PLAVIX.  3-Osteomyelitis L foot, Severe PVD  Sp below knee amputation: need to continue rehab>procedure done by Dr Darrick Penna with no complication.  Marland Kitchen 4- 1out of 2 blood cultures positive - gram-positive cocci in pairs and chains  Strep. Viridans on one set of blood cultures- Dr Butler Denmark discussed this w/ Dr Maurice March (ID) who suspects from the history that this is likely a contaminant.repeat blood culture on11-14.  Is negative up to now ,will dc vancomycin and zosyn , no fever , no WBCS Cardiomyopathy, ischemic/ Chronic systolic CHF- w/ EF of 35 %  SP CATH and stent No ACE due to renal insufficiency. Medical management. Follow up with [facility] as out patient. DM (diabetes mellitus) : resume home medications , episode  of hypoglycemia secondary to NPO    S/P carotid endarterectomy/peripheral vascular disease  Iron deficiency anemia : Who have been dropped to 6.9, requiring 2 units of blood transfusion but most felt to be secondary to history of iron deficiency anemia, chronic kidney disease andpostoperative furtther investigation after rehabilitation. HTN (hypertension)  Continue Metoprolol and Hydralazine.  Chronic Renal insufficiency: Cr stable.  Hypokalemia will repleted.        Day of Discharge BP 136/64  Pulse 60  Temp(Src) 97 F (36.1 C) (Oral)  Resp 19  Ht 5\' 10"  (1.778 m)  Wt 86.4 kg (190 lb 7.6 oz)  BMI 27.33 kg/m2  SpO2 100%  Physical Exam: General: Alert, awake, oriented x3, in no acute distress.  HEENT: No bruits, no goiter.  Heart: Regular rate and rhythm, without murmurs, rubs, gallops.  Lungs: , bilateral air movement, no wheezes or ronchy.  Abdomen: Soft, nontender, nondistended, positive bowel sounds.  Neuro: Grossly intact, nonfocal.  Extremities: SP LEFT BELOW KNEE AMBPUTATION       Results for orders placed during the hospital encounter of 09/18/11 (from the past 24 hour(s))  GLUCOSE, CAPILLARY     Status: Abnormal   Collection Time   09/28/11  4:38 PM      Component Value Range   Glucose-Capillary 171 (*) 70 - 99 (mg/dL)   Comment 1 Documented in Chart     Comment 2 Notify RN    GLUCOSE, CAPILLARY     Status: Abnormal   Collection Time   09/28/11  9:19 PM      Component Value Range   Glucose-Capillary 196 (*) 70 - 99 (mg/dL)  GLUCOSE, CAPILLARY     Status: Abnormal   Collection Time   09/29/11  8:17 AM      Component Value Range   Glucose-Capillary 147 (*) 70 - 99 (mg/dL)  GLUCOSE, CAPILLARY     Status: Abnormal   Collection Time   09/29/11 11:49 AM      Component Value Range   Glucose-Capillary 278 (*) 70 - 99 (mg/dL)    Disposition: CIR   Follow-up Appts: Discharge Orders    Future Appointments: Provider: Department: Dept Phone: Center:   10/26/2011 8:45 AM Sherren Kerns, MD [facility] [phone] [facility]      Need to folow up with [facility] to arrange follow up  stent and consider coumadin  2- follow up with DR FIELDS per his recommendation   Tests Needing Follow-up: cbc  Signed: Mihailo Sage I. 09/29/2011, 4:07 PM

## 2011-09-29 NOTE — Progress Notes (Signed)
[facility] is requesting updated therapy progress this morning to give final decision on admission to inpatient acute rehabilitation or SNF rehab. I have contacted therapy department of need. Please call me with any questions. Pager [phone].

## 2011-09-29 NOTE — Plan of Care (Signed)
Overall Plan of Care [facility]) Patient Details Name: Todd Cook MRN: [id number] DOB: Jan 11, 1941  Diagnosis:  Rehabilitation Primary Diagnosis:    L BKA Co-morbidities: CKD,PVD,DM  Functional Problem List  Patient demonstrates impairments in the following areas: Balance, Edema, Endurance, Motor, Pain, Safety and Skin Integrity  Basic ADL's: grooming, bathing, dressing and toileting  Transfers:  bed mobility, bed to chair, toilet, tub/shower, car and furniture Locomotion:  ambulation, wheelchair mobility and stairs  Additional Impairments:  Leisure Awareness and Discharge Disposition  Anticipated Outcomes Item Anticipated Outcome  Eating/Swallowing    Basic self-care  Overall Supervision  Tolieting  Overall Supervision  Bowel/Bladder  Cont. Manage with min assist  Transfers  Supervision wit transfers  Locomotion  Supervison with gait and w/c  Communication    Cognition    Pain    < 3  Safety/Judgment    Other     Therapy Plan:         Team Interventions: Item RN PT OT SLP SW TR Other  Self Care/Advanced ADL Retraining   x      Neuromuscular Re-Education         Therapeutic Activities  x x      UE/LE Strength Training/ROM  x       UE/LE Coordination Activities         Visual/Perceptual Remediation/Compensation         DME/Adaptive Equipment Instruction  x x      Therapeutic Exercise  x x      Balance/Vestibular Training  x x      Patient/Family Education  x x      Cognitive Remediation/Compensation         Functional Mobility Training  x x      Ambulation/Gait Training  x       Museum/gallery curator  x       Wheelchair Propulsion/Positioning  x       Functional Tourist information centre manager Reintegration  x       Dysphagia/Aspiration Film/video editor         Bladder Management x        Bowel Management x        Disease Management/Prevention x        Pain Management x x       Medication Management x         Skin Care/Wound Management x        Splinting/Orthotics         Discharge Planning     x    Psychosocial Support     x                       Team Discharge Planning: Destination:  Home Projected Follow-up:  PT and OT Projected Equipment Needs:  Cushion, Environmental consultant and Wheelchair, none from OT at this time, patient reports that he has a 3 in 1 and a shower chair with a back.  Anticipate patient will need a tub bench if he is cleared to shower upon discharge and chooses to shower. Patient/family involved in discharge planning:  Yes  MD ELOS: 7-10 Day  Medical Rehab Prognosis:  Good Assessment: See above will need 1-2 hours of PT and OT 5d per week also SLP due to cognition

## 2011-09-29 NOTE — H&P (Signed)
Physical Medicine and Rehabilitation Admission H&P Chief Complaint  Patient presents with  . Chest pain and PVD with left foot ulcers.  : HPI:  Todd Cook is an 70 y.o. Male. With history of CKD, DM, PVD with chronic left great toe ulcer; admitted 09/18/11 with chest pain due to NSTEMI. Pt underwent PTCA with stent placement on 09/20/11 by Dr. Sanjuana Kava. Evaluated by Dr Darrick Penna and as aortogram with evidence of severe PVD, surgery recommended. 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  Post op with ABLA with Hgb down to 6.7 requiring transfusion.  Blood culture X 2 and one positive for strep viridans.   ID feels likely contaminated and monitor for now.    @ROS @ Review of Systems  Constitutional: Negative for fever and chills.  HENT: Positive for hearing loss.   Eyes: Negative.   Respiratory: Negative.  Negative for cough.   Cardiovascular: Negative.   Musculoskeletal: Positive for joint pain.  Skin: Negative.   Psychiatric/Behavioral: The patient has insomnia.   right leg painful but showing improvement  Past Medical History  Diagnosis Date  . Blood transfusion   . Angina   . Chronic kidney disease   . Anemia   . Diabetes mellitus   . Peripheral vascular disease   . Hypertension   . Coronary artery disease   . Myocardial infarction   . Shortness of breath   . Gout     left foot   Past Surgical History  Procedure Date  . 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 Knee arthroplasty     2000  . Cataract extraction, bilateral   . Eye surgery   . Cardiac catheterization   . Amputation 09/26/2011    Procedure: AMPUTATION BELOW KNEE;  Surgeon: Sherren Kerns, MD;  Location: [facility] OR;  Service: Vascular;  Laterality: Left;   History reviewed. No pertinent family history. Social History:  reports that he has never smoked. He does not have any smokeless tobacco history on file. He reports that he does not drink alcohol or use illicit drugs. Allergies:  Allergies  Allergen  Reactions  . Altace     No current outpatient prescriptions on file as of 09/29/2011.    Home: Home Living Lives With: Spouse Receives Help From: Family Type of Home: House Home Layout: One level Home Access: Stairs to enter [employer] of Steps: 1 Bathroom Shower/Tub: Tub/shower unit;Door [employer] Toilet: Standard Bathroom Accessibility: Yes How Accessible: Accessible via walker Home Adaptive Equipment: Shower chair with back   Functional History: Prior Function Level of Independence: Independent with basic ADLs;Independent with homemaking with ambulation;Independent with homemaking with wheelchair;Independent with gait Able to Take Stairs?: Yes Driving: Yes Vocation: Retired  Functional Status:  Mobility: Bed Mobility Bed Mobility: Yes Supine to Sit: 4: Min assist;HOB flat;Other (comment) (Min guard A for bridging to EOB after VC's) Supine to Sit Details (indicate cue type and reason): struggle to get up onto elbows then powered up well Transfers Transfers: Yes Sit to Stand: 4: Min assist;From bed;1: +1 Total assist Sit to Stand Details (indicate cue type and reason): vc's for hand placement;visual cues for technique; manual A for forward w/shift Stand to Sit: 4: Min assist;To chair/3-in-1 Stand to Sit Details: vc for hand placement Squat Pivot Transfers: 1: +2 Total assist;Patient percentage (comment);From elevated surface;With armrests (pt 80%) Squat Pivot Transfer Details (indicate cue type and reason): pt felt much more confortable with squat-pivot transfer than full stand.  Educated pt on chair set-up to right for ease  of transfers Ambulation/Gait Ambulation/Gait: Yes Ambulation/Gait Assistance: 3: Mod assist;Patient percentage (comment) (pt =70%; with RW) Ambulation/Gait Assistance Details (indicate cue type and reason): vc/visual cues for "swing to " technique; manual A to maneuver RW Ambulation Distance (Feet): 10 Feet Assistive device: Rolling  walker Gait Pattern:  (swing to gait pattern) Stairs: No Wheelchair Mobility Wheelchair Mobility: No  ADL: ADL Eating/Feeding: Simulated;Set up Where Assessed - Eating/Feeding: Edge of bed Grooming: Performed Where Assessed - Grooming: Sitting, bed Upper Body Bathing: Simulated;Chest;Right arm;Left arm;Abdomen;Set up Where Assessed - Upper Body Bathing: Sitting, bed Lower Body Bathing: Simulated;Maximal assistance Where Assessed - Lower Body Bathing: Sit to stand from bed Upper Body Dressing: Performed;Set up Upper Body Dressing Details (indicate cue type and reason): with donning gown Where Assessed - Upper Body Dressing: Sitting, bed Lower Body Dressing: Simulated;Maximal assistance Where Assessed - Lower Body Dressing: Sit to stand from bed Toilet Transfer: Not assessed Toilet Transfer Method: Not assessed Toileting - Clothing Manipulation: Not assessed Where Assessed - Toileting Clothing Manipulation: Not assessed Toileting - Hygiene: Not assessed Where Assessed - Toileting Hygiene: Not assessed Tub/Shower Transfer: Not assessed Tub/Shower Transfer Method: Not assessed Equipment Used: Rolling walker  Cognition: Cognition Arousal/Alertness: Awake/alert Orientation Level: Oriented X4 Cognition Arousal/Alertness: Awake/alert Overall Cognitive Status: Appears within functional limits for tasks assessed Orientation Level: Oriented X4   Blood pressure 150/65, pulse 59, temperature 97.6 F (36.4 C), temperature source Oral, resp. rate 19, height 5\' 10"  (1.778 m), weight 86.4 kg (190 lb 7.6 oz), SpO2 94.00%. @PHYSEXAMBYAGE2 @ Physical Exam  Constitutional: He appears well-developed.  HENT:  Mouth/Throat: Abnormal dentition.  Musculoskeletal:       Legs: Neurological: A sensory deficit is present.  Reflex Scores:      Tricep reflexes are 1+ on the right side and 1+ on the left side.      Bicep reflexes are 1+ on the right side and 1+ on the left side.       Brachioradialis reflexes are 1+ on the right side and 1+ on the left side.      Patellar reflexes are 1+ on the right side.      Achilles reflexes are 1+ on the right side.      Alert and oriented x3 with cues..  Stocking glove sensory loss to mid calf RLE,  Some tingling in both hands.  Strength in upper extremities 4-5/5.  RLE grossly 4-5/5 with some slight diminishment distally.  Left leg 2/5 at hip. I didn't test knee.   Patient also is hard of hearing.  Still slightly disoriented.   Marland Kitchen  Post Admission Physician Evaluation: 1. Functional deficits secondary  to Left BKA 2. Patient admitted to receive collaborative, interdisciplinary care between the physiatrist, rehab nursing staff, and therapy team. 3. Patient's level of medical complexity and substantial therapy needs in context of that medical necessity cannot be provided at a lesser intensity of care. 4. Patient has experienced substantial functional loss from his/her baseline. Please see above functional evals for premorbid and current levels.  Judging by the patient's diagnosis, physical exam, and functional history, the patient has potential for functional progress which will result in measurable gains while on inpatient rehab.  These gains will be of substantial and practical use upon discharge in facilitating mobility and self-care at the household level. 5. Physiatrist will provide 24 hour management of medical needs as well as oversight of the therapy plan/treatment and provide guidance as appropriate regarding the interaction of the two. 6. 24 hour rehab nursing will  assist in the management of  bladder management, bowel management, safety, skin/wound care, pain management and patient education  and help integrate therapy concepts, techniques,education, etc. 7. PT will assess and treat for: balance, locomotion, strength and transferring. Goals are: independent with assistive device/ to perhaps supervision. OT will assess and treat for:  bathing, bowel/bladder control, dressing, psychosocial adjustment, toileting and transferring .  Goals are: independent with assistive device to supervision  8. SLP will potentially assess and treat for: cognition and awareness.  Goals are: independent. (Will allow OT and PT observe for safety needs for now before making a formal consult) 9. Case Management and Social Worker will assess and treat for psychological issues and discharge planning. 10. Team conference will be held weekly to assess progress toward goals and to determine barriers to discharge. 11.  Patient will receive at least 3 hours of therapy per day at least 5 days per week. 12. ELOS and Prognosis: 7-10 days excellent   Medical Problem List and Plan: 1. DVT Prophylaxis/Anticoagulation: Pharmaceutical: Lovenox 2. Pain Management: Pain limiting activity.  Will add long acting pain medication to help with consistent pain relief...need to carefully watch cognition however.   Patient is already massaging leg, and I encouraged further sensory feedback to reduce pain.   3. Mood: Monitor.  Remains motivated.  Patient Active Hospital Problem List: Non-STEMI (non-ST elevated myocardial infarction) (09/18/2011)    POA: Yes   Assessment:  Plan Continue Aspirin, plavix and crestor DM (diabetes mellitus) type I uncontrolled Assessment: Blood sugars variable currently. ,   Plan: Monitor with AC/HS cbg checks.  Use SSI for tighter blood sugar control and to promote wound healing.  Continue tradjenta,  Increase amaryl to home dose of 4mg  and D/Clantus Iron deficiency anemia (09/18/2011)    POA: Unknown   Assessment: Now with acute on chronic anemia with last hgb @6 .7 and patient transfused yesterday.   Check stool guaiacs.  Add iron supplement.   Plan: Check stool guaiacs.  Add iron supplement. Gout  (09/18/2011)    POA: Yes   Assessment: no symptoms currently    Plan: Continue allopurinol daily. HTN (hypertension) (09/18/2011)    POA: Yes    Assessment: borderline.  Observe with activity.  Pain also affecting.  Same meds for now   Plan: Monitor with BID checks.  Continue hydralazine and metoprolol.  Cardiomyopathy, ischemic (09/20/2011)    POA: Yes   Assessment:    Plan: Monitor for fluid overload.  Continue hydralazine and check daily weights.  On lopressor but no ace noted likely due to renal insufficiency. Periph vascular disorder (09/18/2011)    POA: Yes Assessment:   Plan:  Pressure relief measures and monitor for breakdown.  Chronic Kidney Disease:  POA: Yes Assessment: BUN/Creatinine @47 /1.72 at admission.  Currently a little worse @ 53/2.16.  Likely due to dye studies.   Plan:  Monitor with routine checks.  Avoid nephrotoxic med's. IVF Insomnia: Schedule trazodone. IVF playing a role as he's having a lot of frequency.

## 2011-09-29 NOTE — Progress Notes (Signed)
Discussed with pt and daughter MI, HF, stent, diet and NTG.  Daughter voiced understanding.  Not sure how much pt comprehended.  Will sign off.  [id number] Todd Cook CES, ACSM

## 2011-09-29 NOTE — Progress Notes (Signed)
Pt alert oriented x 3, wife is room with pt, he was oriented to the rehab dept, BKA to left foot has staple wrap in ace bandage no drainage noted at this time, he denies pain at this time, safety video was viewed, bruise to Right hand, hep lock to anterior of lower hand. Site clean dry no edema noted. Will continue with plan of care

## 2011-09-29 NOTE — Plan of Care (Signed)
Problem: RH PAIN MANAGEMENT Goal: RH STG PAIN MANAGED AT OR BELOW PT'S PAIN GOAL Outcome: Progressing Pt denies pain at this time. Goal for pain control < 3

## 2011-09-29 NOTE — Progress Notes (Signed)
[facility] Progress Note  09/29/2011 9:55 AM POD 3  Doing well.  Filed Vitals:   09/29/11 0454  BP: 150/65  Pulse: 59  Temp: 97.6 F (36.4 C)  Resp: 19    Incisions:  C/d/i--staples in tact.  No drainage.  No ecchymosis Extremities:  Warm LLE.  Good movement.  CBC    Component Value Date/Time   WBC 10.3 09/28/2011 0540   RBC 2.26* 09/28/2011 0540   HGB 6.7* 09/28/2011 0540   HCT 19.5* 09/28/2011 0540   PLT 233 09/28/2011 0540   MCV 86.3 09/28/2011 0540   MCH 29.6 09/28/2011 0540   MCHC 34.4 09/28/2011 0540   RDW 14.0 09/28/2011 0540   LYMPHSABS 0.4* 09/18/2011 1420   MONOABS 0.6 09/18/2011 1420   EOSABS 0.0 09/18/2011 1420   BASOSABS 0.0 09/18/2011 1420    BMET    Component Value Date/Time   NA 132* 09/28/2011 0540   K 3.8 09/28/2011 0540   CL 102 09/28/2011 0540   CO2 20 09/28/2011 0540   GLUCOSE 195* 09/28/2011 0540   BUN 53* 09/28/2011 0540   CREATININE 2.16* 09/28/2011 0540   CALCIUM 7.5* 09/28/2011 0540   GFRNONAA 29* 09/28/2011 0540   GFRAA 33* 09/28/2011 0540    INR    Component Value Date/Time   INR 1.25 09/19/2011 1938     Intake/Output Summary (Last 24 hours) at 09/29/11 0955 Last data filed at 09/29/11 0815  Gross per 24 hour  Intake 1155.5 ml  Output   3800 ml  Net -2644.5 ml     Assessment/Plan:  70 y.o. male is s/p left BKA POD 3 Doing well.  Continue PT.   May d/c from our point of view. Follow up with Dr. Darrick Penna in 4 weeks.  Newton Pigg, PA-C [facility] [phone] 09/29/2011 9:55 AM

## 2011-09-29 NOTE — Progress Notes (Signed)
SUBJECTIVE: NO chest pain or SOB.   BP 150/65  Pulse 59  Temp(Src) 97.6 F (36.4 C) (Oral)  Resp 19  Ht 5\' 10"  (1.778 m)  Wt 190 lb 7.6 oz (86.4 kg)  BMI 27.33 kg/m2  SpO2 94%  Intake/Output Summary (Last 24 hours) at 09/29/11 4782 Last data filed at 09/29/11 0424  Gross per 24 hour  Intake   1228 ml  Output   4190 ml  Net  -2962 ml    PHYSICAL EXAM General: Well developed, well nourished, in no acute distress. Alert and oriented x 3.  Psych:  Good affect, responds appropriately Neck: No JVD. No masses noted.  Lungs: Clear bilaterally with no wheezes or rhonci noted.  Heart: RRR with no murmurs noted. Abdomen: Bowel sounds are present. Soft, non-tender.  Extremities: No lower extremity edema. S/p left BKA.  LABS: Basic Metabolic Panel:  Basename 09/28/11 0540 09/27/11 1718  NA 132* 127*  K 3.8 4.3  CL 102 98  CO2 20 17*  GLUCOSE 195* 421*  BUN 53* 53*  CREATININE 2.16* 2.20*  CALCIUM 7.5* 7.6*  MG -- --  PHOS -- --   CBC:  Basename 09/28/11 0540 09/27/11 0642  WBC 10.3 14.0*  NEUTROABS -- --  HGB 6.7* 8.2*  HCT 19.5* 24.8*  MCV 86.3 88.3  PLT 233 279    Current Meds:    . acetaminophen  650 mg Oral Once  . allopurinol  300 mg Oral Daily  . aspirin EC  81 mg Oral Daily  . clopidogrel  75 mg Oral Q breakfast  . diphenhydrAMINE  25 mg Oral Once  . docusate sodium  100 mg Oral Daily  . enoxaparin  40 mg Subcutaneous Q24H  . famotidine  20 mg Oral Daily  . furosemide  20 mg Intravenous Once  . glimepiride  2 mg Oral BID  . hydrALAZINE  10 mg Oral BID  . insulin aspart  0-20 Units Subcutaneous TID WC  . insulin aspart  0-5 Units Subcutaneous QHS  . insulin glargine  5 Units Subcutaneous QHS  . linagliptin  5 mg Oral Daily  . living well with diabetes book   Does not apply Once  . metoprolol tartrate  50 mg Oral BID  . potassium chloride  20 mEq Oral Once  . rosuvastatin  10 mg Oral Daily  . sodium chloride  3 mL Intravenous Q12H  . DISCONTD:  furosemide  20 mg Intravenous Once  . DISCONTD: glimepiride  2 mg Oral Q breakfast  . DISCONTD: insulin glargine  10 Units Subcutaneous QHS     ASSESSMENT AND PLAN: 1. CAD: Admitted with NSTEMI. Now s/p PCI and doing well. Continue current cardiac therapy including ASA and Plavix.  2. Atrial fibrillation: In NSR. Need for coumadin will be discussed as an oupatient.  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 Doing well post op.  4. Ischemic CM: Echo 09/20/11 revealed EF 35%. Continue medical therapy. He is on a beta blocker, but not on an ACE-inhibitor secondary to renal insufficiency.  5. CRI: MIld worsening after several procedures with contrast including cardiac cath, LE runoff and BkA with post-op blood loss.  6. Anemia: Post op. Per primary team.  7. Dispo: Cardiac follow up with Dr. Sharrell Ku after discharge. I will sign off for now. Please call us with questions.      MCALHANY,CHRISTOPHER  11/16/20127:52 AM

## 2011-09-29 NOTE — Progress Notes (Signed)
Physical Therapy Treatment Patient Details Name: Todd Cook MRN: [id number] DOB: March 12, 1941 Today's Date: 09/29/2011  PT Assessment/Plan  PT - Assessment/Plan Comments on Treatment Session: pt is such a good rehab candidate.  He is limited by pain. but strenth in functional and balance is progressing well PT Plan: Discharge plan remains appropriate PT Frequency: Min 3X/week Follow Up Recommendations: Inpatient Rehab Equipment Recommended: Defer to next venue PT Goals  Acute Rehab PT Goals PT Goal: Rolling Supine to Right Side - Progress: Progressing toward goal PT Goal: Rolling Supine to Left Side - Progress:  (not address) PT Goal: Supine/Side to Sit - Progress: Progressing toward goal PT Goal: Sit to Supine/Side - Progress: Other (comment) (not addressed) Pt will Transfer Bed to Chair/Chair to Bed: with +1 total assist PT Transfer Goal: Bed to Chair/Chair to Bed - Progress: Other (comment) (not addressed today) PT Goal: Ambulate - Progress: Progressing toward goal PT Goal: Perform Home Exercise Program - Progress: Progressing toward goal Additional Goals Additional Goal #1:  (progressing) PT Goal: Additional Goal #1 - Progress: Progressing toward goal  PT Treatment Precautions/Restrictions  Precautions Precautions: Fall Restrictions Weight Bearing Restrictions: No Mobility (including Balance) Bed Mobility Bed Mobility: Yes Supine to Sit: 4: Min assist;HOB flat;Other (comment) (Min guard A for bridging to EOB after VC's) Supine to Sit Details (indicate cue type and reason): struggle to get up onto elbows then powered up well Transfers Transfers: Yes Sit to Stand: 4: Min assist;From bed;1: +1 Total assist Sit to Stand Details (indicate cue type and reason): vc's for hand placement;visual cues for technique; manual A for forward w/shift Stand to Sit: 4: Min assist;To chair/3-in-1 Stand to Sit Details: vc for hand placement Ambulation/Gait Ambulation/Gait:  Yes Ambulation/Gait Assistance: 3: Mod assist;Patient percentage (comment) (pt =70%; with RW) Ambulation/Gait Assistance Details (indicate cue type and reason): vc/visual cues for "swing to " technique; manual A to maneuver RW Ambulation Distance (Feet): 10 Feet Assistive device: Rolling walker Gait Pattern:  (swing to gait pattern)  Posture/Postural Control Posture/Postural Control: No significant limitations Postural Limitations: vc for postural checks Balance Balance Assessed: Yes Static Sitting Balance Static Sitting - Level of Assistance: 5: Stand by assistance Exercise  Amputee Exercises Quad Sets: Left;AAROM;10 reps;Supine Hip Extension: AAROM;Left;10 reps;Supine Hip ABduction/ADduction: AAROM;5 reps;Left;Supine Knee Flexion: AAROM;Left;10 reps;Supine;Other (comment) (hip flexion with knee flexion, hip ext wit knee straight) Knee Extension: AROM;10 reps;Left;Supine End of Session PT - End of Session Activity Tolerance: Patient limited by pain Patient left: in chair General Behavior During Session: [facility] for tasks performed Cognitive Impairment: pt slow to process, could be due to [facility] &/ or pain meds  Markitta Ausburn, Eliseo Gum 09/29/2011, 11:02 AM  09/29/2011  Verona Bing, PT [phone] (pager)

## 2011-09-29 NOTE — Progress Notes (Signed)
Order where verified by Marissa Nestle PA, she gave order to release all order on pt profile from all providers.

## 2011-09-30 DIAGNOSIS — S88119A Complete traumatic amputation at level between knee and ankle, unspecified lower leg, initial encounter: Secondary | ICD-10-CM

## 2011-09-30 DIAGNOSIS — I70269 Atherosclerosis of native arteries of extremities with gangrene, unspecified extremity: Secondary | ICD-10-CM

## 2011-09-30 LAB — GLUCOSE, CAPILLARY
Glucose-Capillary: 181 mg/dL — ABNORMAL HIGH (ref 70–99)
Glucose-Capillary: 202 mg/dL — ABNORMAL HIGH (ref 70–99)

## 2011-09-30 NOTE — Progress Notes (Signed)
Physical Therapy Assessment and Plan  Patient Details  Name: Todd Cook MRN: [id number] Date of Birth: 09/16/1941  PT Diagnosis: Difficulty walking and Muscle weakness Rehab Potential: Good ELOS: 7-10 days   Time: 1100-1200  Assessment & Plan Clinical Impression: Patient is a 70 y.o. year old male with recent admission to the hospital on 09/18/11 with chest pain due to NSTEM.   Patient with history of CKD, DM, PVD with chronic left great toe ulcer; admitted. Pt underwent PTCA with stent placement on 09/20/11 by Dr. Sanjuana Kava. Evaluated by Dr Darrick Penna and an aortogram with evidence of severe PVD, surgery recommended. 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 Post op with ABLA with Hgb down to 6.7 requiring transfusion. Blood culture X 2 and one positive for strep viridans. ID feels likely contaminated and monitor for now.     Patient transferred to CIR on 09/29/2011 .  Patient's past medical history is significant for CKD, DM, PVD with chronic left great toe ulcer.    Patient currently requires min with mobility secondary to muscle weakness.  Prior to hospitalization, patient was Independent with mobility and lived with spouse in a single level home.  Home access is one step.  Patient will benefit from skilled PT intervention to maximize safe functional mobility, minimize fall risk and decrease caregiver burden for planned discharge home with 24 hour supervision.  Anticipate patient will benefit from follow up HH at discharge.  PT - End of Session Activity Tolerance: Tolerates < 10 min activity, no significant change in vital signs Endurance Deficit: Yes Endurance Deficit Description: SOB/DOE requires frequent rests PT Assessment Rehab Potential: Good Barriers to Discharge: None PT Plan PT Frequency: 1-2 X/day, 60-90 minutes;5 out of 7 days Estimated Length of Stay: 7-10 days PT Treatment/Interventions: Ambulation/gait training;Balance/vestibular training;DME/adaptive equipment  instruction;Functional mobility training;Neuromuscular re-education;Pain management;Patient/family education;Stair training;Therapeutic Activities;Therapeutic Exercise;UE/LE Strength taining/ROM;Wheelchair propulsion/positioning  Precautions/Restrictions Precautions Precautions: Other (comment) (L BKA) Precaution Comments: L BKA, Falls Required Braces or Orthoses: No Restrictions Weight Bearing Restrictions: Yes LLE Weight Bearing: Non weight bearing General @[id number] ([id number]) Vital Signs   Pain Pain Assessment Pain Score:   1 Home Living/Prior Functioning   Vision/Perception  Perception Perception: Within Functional Limits Praxis Praxis: Intact  Cognition Overall Cognitive Status: Appears within functional limits for tasks assessed Arousal/Alertness: Awake/alert Orientation Level: Oriented X4 Attention: Focused Sensation Sensation Light Touch: Appears Intact Stereognosis: Not tested Hot/Cold: Not tested Proprioception: Not tested (L BKA with dressing) Coordination Gross Motor Movements are Fluid and Coordinated: Yes Fine Motor Movements are Fluid and Coordinated: Yes Heel Shin Test: NT'd Motor  Motor Motor: Within Functional Limits  Mobility Bed Mobility Bed Mobility: Yes Rolling Right: 4: Min assist Rolling Right Details: Tactile cues for placement Rolling Left: 4: Min assist Rolling Left Details: Tactile cues for placement Right Sidelying to Sit: 4: Min assist Right Sidelying to Sit Details: Tactile cues for weight shifting Left Sidelying to Sit: 4: Min assist Left Sidelying to Sit Details: Tactile cues for weight shifting Supine to Sit: 4: Min assist;HOB flat Supine to Sit Details: Other (Comment) Supine to Sit Details (indicate cue type and reason): struggled to get up on elbows and then able to sit up will Sitting - Scoot to Edge of Bed: 5: Supervision Sitting - Scoot to [place] of Bed Details: Verbal cues for  precautions/safety Transfers Transfers: Yes Sit to Stand: 4: Min assist Sit to Stand Details: Tactile cues for placement;Verbal cues for precautions/safety Sit to Stand Details (indicate cue type and reason): Hand placement and sequencing for  safety into Rolling Walker Stand to Sit: 4: Min assist Stand to Sit Details (indicate cue type and reason): Tactile cues for placement;Verbal cues for precautions/safety Stand to Sit Details: vc's for hand placement, safety Locomotion  Ambulation Ambulation: Yes Ambulation/Gait Assistance: 4: Min assist Ambulation Distance (Feet): 10 Feet Assistive device: Rolling walker Ambulation/Gait Assistance Details: Tactile cues for weight beaing Ambulation/Gait Assistance Details (indicate cue type and reason): hopping on R LE and min-A for turning Gait Gait: Yes (hop on R LE) Gait Pattern: Impaired Gait Pattern:  (Hopping on R LE) Stairs / Additional Locomotion Stairs: No Wheelchair Mobility Wheelchair Mobility: No  Trunk/Postural Assessment  Postural Control Postural Limitations: pt adjusting to decreased wt on the left side due to BKA  Balance Static Sitting Balance Static Sitting - Level of Assistance: 5: Stand by assistance Static Standing Balance Static Standing - Level of Assistance: 4: Min assist Extremity Assessment      RLE Assessment RLE Assessment: Within Functional Limits LLE Assessment LLE Assessment: Exceptions to [facility] LLE AROM (degrees) Left Hip Flexion 0-125: 120  Left Hip ABduction 0-45: 35  Left Hip ADduction 0-25: 20  Left Knee Extension 0-130: -5  Left Knee Flexion 0-140: 125  LLE Strength Left Hip Flexion: 3/5 Left Hip ABduction: 3+/5 Left Hip ADduction: 3+/5 Left Knee Flexion: 3/5 Left Knee Extension: 3-/5  Recommendations for other services: Other: Home Health upon discharge  Discharge Criteria: Patient will be discharged from PT if patient refuses treatment 3 consecutive times without medical reason, if  treatment goals not met, if there is a change in medical status, if patient makes no progress towards goals or if patient is discharged from hospital.  The above assessment, treatment plan, treatment alternatives and goals were discussed and mutually agreed upon: by family  Treatment: L LE therapeutic Exercise in supine and sitting at edge of bed.   Jodelle Gross 09/30/2011, 1:30 PM

## 2011-09-30 NOTE — Progress Notes (Signed)
Occupational Therapy Assessment and Plan  Patient Details  Name: Todd Cook MRN: [id number] Date of Birth: 1940/12/17  TIme:  [id number] Time Calculation:  60 minutes  OT Diagnosis: muscle weakness (generalized) and decreased functional mobility with BADL Rehab Potential: Rehab Potential: Good ELOS: 7-10 days  Assessment & Plan Clinical Impression: Patient is a 70 y.o. year old male was originally admitted 09/18/11 with chest pain due to NSTEMI.  Underwent PTCA 09/20/11. Underwent aortogram with severe PVD and L BKA 09/26/11.  PCP had been treating patient pta ulcers left foot. Felt not to be a candidate for surgical bypass therefore BKA.  Patient transferred to CIR on 09/29/2011 .  Patient's past medical history is significant for Angina, Chronic Kidney Disease, DM, Hypertension, CAD, MI.  Patient presents to Occupational Therapy with decreased endurance and decreased overall strength as well as questionable decreased cognition secondary to patient is hyper verbal and requires regular cues for redirection.  Patient sometimes appears hard of hearing yet declines hearing impairment.  Note from OT on acute side of hospital noted that wife concerned about patient's safety awareness.  Will continue to assess cognition.  Patient currently requires mod with basic self-care skills secondary to muscle weakness, decreased cardiorespiratoy endurance, and decreased standing balance.   Patient will benefit from skilled intervention to decrease level of assist with basic self-care skills, increase independence with basic self-care skills and increase level of independence with IADL prior to discharge home with care partner.  Anticipate patient will require 24 hour supervision and follow up home health.  OT - End of Session Activity Tolerance: Tolerates 30+ min activity with multiple rests Endurance Deficit: Yes  OT Assessment Rehab Potential: Good Barriers to Discharge: Decreased caregiver  support Barriers to Discharge Comments: Patient's wife only able to provide Supervision at D/C  OT Plan OT Frequency: 1-2 X/day, 60-90 minutes OT Treatment/Interventions: Balance/vestibular training;DME/adaptive equipment instruction;Patient/family education;Functional mobility training;Self Care/advanced ADL retraining;Therapeutic Activities;Therapeutic Exercise  STG = LTG  Overall Supervision with all BADL tasks.  Precautions/Restrictions: Fall risk secondary to BKA and questionable safety awareness.   General Chart Reviewed: Yes Family/Caregiver Present: No  Pain No/denies pain  Home Living Lives With: Spouse Type of Home: House Home Layout: One level Bathroom Shower/Tub: Tub/shower unit;Door [employer] Toilet: Standard Bathroom Accessibility: Yes How Accessible: Accessible via walker Home Adaptive Equipment: Bedside commode/3-in-1;Shower chair with back  Prior Function Level of Independence: Independent with basic ADLs Driving: Yes Vocation: Retired  ADL ADL Eating: Independent Where Assessed-Eating: Bed level Grooming: Setup Where Assessed-Grooming: Sitting at sink Upper Body Bathing: Setup Where Assessed-Upper Body Bathing: Edge of bed Lower Body Bathing: Not assessed (declined, "I put on clean underware this morning") Upper Body Dressing: Setup Where Assessed-Upper Body Dressing: Edge of bed Lower Body Dressing: Setup Where Assessed-Lower Body Dressing: Edge of bed (sit and stand with min assist) ADL Comments: Patient initially declined to perform bath and dress stating "I'm alright with all of that stuff".  Patient also reports that his wife will not leave his side and will do alot for him however, when asked, patient agrees that he want to perform as much as he can for himself.  Patient states that his bathroom door willl probably not accomodate a W/C therefore realizes that he may need to use a BSC and sponge bath at EOB upon D/C.  Patient to have family measure  bathroom doorway clearance.      Cognition Overall Cognitive Status: Appears within functional limits for tasks assessed Orientation Level: Oriented X4;Other (Comment) (  Patient overly talkative with need for regular redirection )    Balance Static Standing Balance Static Standing - Level of Assistance: 3: Mod assist  Extremity/Trunk Assessment RUE Assessment RUE Assessment: Within Functional Limits LUE Assessment LUE Assessment: Within Functional Limits  Discharge Criteria: Patient will be discharged from OT if patient refuses treatment 3 consecutive times without medical reason, if treatment goals not met, if there is a change in medical status, if patient makes no progress towards goals or if patient is discharged from hospital.  The above assessment, treatment plan, treatment alternatives and goals were discussed and mutually agreed upon: by patient (not family available)  Individual Therapy  Chaya Dehaan 09/30/2011, 3:00 PM

## 2011-09-30 NOTE — Progress Notes (Signed)
Patient very pleasant, wife at the bedside assisting with patent. Patients first day for eval's by therapy, and cooperative. Follows commands appropriately.  L BKA dressing changed and assessed. No unsafe issues noted continue plan of care. Rickie Gutierres, Phill Mutter

## 2011-09-30 NOTE — Progress Notes (Signed)
C/o pain in his L amputee  Todd Cook is an 70 y.o. Male. With history of CKD, DM, PVD with chronic left great toe ulcer; admitted 09/18/11 with chest pain due to NSTEMI. Pt underwent PTCA with stent placement on 09/20/11 by Dr. Sanjuana Kava. Evaluated by Dr Darrick Penna and as aortogram with evidence of severe PVD, surgery recommended. 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 Post op with ABLA with Hgb down to 6.7 requiring transfusion. Blood culture X 2 and one positive for strep viridans. ID feels likely contaminated and monitor for now.     Review of Systems  Constitutional: Negative for fever and chills.  HENT: Positive for hearing loss.  Eyes: Negative.  Respiratory: Negative. Negative for cough.  Cardiovascular: Negative.  Musculoskeletal: Positive for joint pain.  Skin: Negative.  Psychiatric/Behavioral: The patient has insomnia.  right leg painful but showing improvement  Past Medical History   Diagnosis  Date   .  Blood transfusion    .  Angina    .  Chronic kidney disease    .  Anemia    .  Diabetes mellitus    .  Peripheral vascular disease    .  Hypertension    .  Coronary artery disease    .  Myocardial infarction    .  Shortness of breath    .  Gout      left foot    Past Surgical History   Procedure  Date   .  Coronary artery bypass graft  10 years ago   .  Knee arthroplasty      2000   .  Cataract extraction, bilateral    .  Eye surgery    .  Cardiac catheterization    .  Amputation  09/26/2011     Procedure: AMPUTATION BELOW KNEE; Surgeon: Sherren Kerns, MD; Location: [facility] OR; Service: Vascular; Laterality: Left;   History reviewed. No pertinent family history.  Social History: reports that he has never smoked. He does not have any smokeless tobacco history on file. He reports that he does not drink alcohol or use illicit drugs.  Allergies:  Allergies   Allergen  Reactions   .  Altace     No current outpatient prescriptions on file as of 09/29/2011.     Home:  Home Living  Lives With: Spouse  Receives Help From: Family  Type of Home: House  Home Layout: One level  Home Access: Stairs to enter  [employer] of Steps: 1  Bathroom Shower/Tub: Tub/shower unit;Door  [employer] Toilet: Standard  Bathroom Accessibility: Yes  How Accessible: Accessible via walker  Home Adaptive Equipment: Shower chair with back  Functional History:  Prior Function  Level of Independence: Independent with basic ADLs;Independent with homemaking with ambulation;Independent with homemaking with wheelchair;Independent with gait  Able to Take Stairs?: Yes  Driving: Yes  Vocation: Retired  Functional Status:  Mobility:  Bed Mobility  Bed Mobility: Yes  Supine to Sit: 4: Min assist;HOB flat;Other (comment) (Min guard A for bridging to EOB after VC's)  Supine to Sit Details (indicate cue type and reason): struggle to get up onto elbows then powered up well  Transfers  Transfers: Yes  Sit to Stand: 4: Min assist;From bed;1: +1 Total assist  Sit to Stand Details (indicate cue type and reason): vc's for hand placement;visual cues for technique; manual A for forward w/shift  Stand to Sit: 4: Min assist;To chair/3-in-1  Stand to Sit Details: vc for hand placement  Squat Pivot  Transfers: 1: +2 Total assist;Patient percentage (comment);From elevated surface;With armrests (pt 80%)  Squat Pivot Transfer Details (indicate cue type and reason): pt felt much more confortable with squat-pivot transfer than full stand. Educated pt on chair set-up to right for ease of transfers  Ambulation/Gait  Ambulation/Gait: Yes  Ambulation/Gait Assistance: 3: Mod assist;Patient percentage (comment) (pt =70%; with RW)  Ambulation/Gait Assistance Details (indicate cue type and reason): vc/visual cues for "swing to " technique; manual A to maneuver RW  Ambulation Distance (Feet): 10 Feet  Assistive device: Rolling walker  Gait Pattern: (swing to gait pattern)  Stairs: No   Wheelchair Mobility  Wheelchair Mobility: No  ADL:  ADL  Eating/Feeding: Simulated;Set up  Where Assessed - Eating/Feeding: Edge of bed  Grooming: Performed  Where Assessed - Grooming: Sitting, bed  Upper Body Bathing: Simulated;Chest;Right arm;Left arm;Abdomen;Set up  Where Assessed - Upper Body Bathing: Sitting, bed  Lower Body Bathing: Simulated;Maximal assistance  Where Assessed - Lower Body Bathing: Sit to stand from bed  Upper Body Dressing: Performed;Set up  Upper Body Dressing Details (indicate cue type and reason): with donning gown  Where Assessed - Upper Body Dressing: Sitting, bed  Lower Body Dressing: Simulated;Maximal assistance  Where Assessed - Lower Body Dressing: Sit to stand from bed  Toilet Transfer: Not assessed  Toilet Transfer Method: Not assessed  Toileting - Clothing Manipulation: Not assessed  Where Assessed - Toileting Clothing Manipulation: Not assessed  Toileting - Hygiene: Not assessed  Where Assessed - Toileting Hygiene: Not assessed  Tub/Shower Transfer: Not assessed  Tub/Shower Transfer Method: Not assessed  Equipment Used: Rolling walker  Cognition:  Cognition  Arousal/Alertness: Awake/alert  Orientation Level: Oriented X4  Cognition  Arousal/Alertness: Awake/alert  Overall Cognitive Status: Appears within functional limits for tasks assessed  Orientation Level: Oriented X4  BP 137/67  Pulse 67  Temp(Src) 98.3 F (36.8 C) (Oral)  Resp 18  Ht 5\' 10"  (1.778 m)  Wt 183 lb 10.3 oz (83.3 kg)  BMI 26.35 kg/m2  SpO2 98%    Physical Exam  Constitutional: He appears well-developed.  HENT:  Mouth/Throat: Abnormal dentition.  Musculoskeletal:  Legs: Neurological: A sensory deficit is present.  Reflex Scores:  Tricep reflexes are 1+ on the right side and 1+ on the left side.  Bicep reflexes are 1+ on the right side and 1+ on the left side.  Brachioradialis reflexes are 1+ on the right side and 1+ on the left side.  Patellar reflexes are  1+ on the right side.  Achilles reflexes are 1+ on the right side. Alert and oriented x3 with cues.. Stocking glove sensory loss to mid calf RLE, Some tingling in both hands. Strength in upper extremities 4-5/5. RLE grossly 4-5/5 with some slight diminishment distally. Left leg 2/5 at hip. I didn't test knee.  Patient also is hard of hearing. Still slightly disoriented.  Marland Kitchen  Post Admission Physician Evaluation:  1. Functional deficits secondary to Left BKA 2. Patient admitted to receive collaborative, interdisciplinary care between the physiatrist, rehab nursing staff, and therapy team. 3. Patient's level of medical complexity and substantial therapy needs in context of that medical necessity cannot be provided at a lesser intensity of care. 4. Patient has experienced substantial functional loss from his/her baseline. Please see above functional evals for premorbid and current levels. Judging by the patient's diagnosis, physical exam, and functional history, the patient has potential for functional progress which will result in measurable gains while on inpatient rehab. These gains will be of substantial and  practical use upon discharge in facilitating mobility and self-care at the household level. 5. Physiatrist will provide 24 hour management of medical needs as well as oversight of the therapy plan/treatment and provide guidance as appropriate regarding the interaction of the two. 6. 24 hour rehab nursing will assist in the management of bladder management, bowel management, safety, skin/wound care, pain management and patient education and help integrate therapy concepts, techniques,education, etc. 7. PT will assess and treat for: balance, locomotion, strength and transferring. Goals are: independent with assistive device/ to perhaps supervision. OT will assess and treat for: bathing, bowel/bladder control, dressing, psychosocial adjustment, toileting and transferring . Goals are: independent with  assistive device to supervision  8. SLP will potentially assess and treat for: cognition and awareness. Goals are: independent. (Will allow OT and PT observe for safety needs for now before making a formal consult) 9. Case Management and Social Worker will assess and treat for psychological issues and discharge planning. 10. Team conference will be held weekly to assess progress toward goals and to determine barriers to discharge. 11. Patient will receive at least 3 hours of therapy per day at least 5 days per week. 12. ELOS and Prognosis: 7-10 days excellent Medical Problem List and Plan:  1. DVT Prophylaxis/Anticoagulation: Pharmaceutical: Lovenox  2. Pain Management: Pain limiting activity. Will add long acting pain medication to help with consistent pain relief...need to carefully watch cognition however. Patient is already massaging leg, and I encouraged further sensory feedback to reduce pain. Discussed w/pt and his wife 3. Mood: Monitor. Remains motivated.  Patient Active Hospital Problem List: Non-STEMI (non-ST elevated myocardial infarction) (09/18/2011) POA: Yes Assessment:  Plan Continue Aspirin, plavix and crestor DM (diabetes mellitus) type I uncontrolled  Assessment: Blood sugars variable currently.  , Plan: Monitor with AC/HS cbg checks. Use SSI for tighter blood sugar control and to promote wound healing. Continue tradjenta, Increase amaryl to home dose of 4mg  and D/Clantus Iron deficiency anemia (09/18/2011) POA: Unknown Assessment: Now with acute on chronic anemia with last hgb @6 .7 and patient transfused yesterday.  Check stool guaiacs. Add iron supplement.  Plan: Check stool guaiacs. Add iron supplement.  Gout (09/18/2011) POA: Yes Assessment: no symptoms currently  Plan: Continue allopurinol daily.  HTN (hypertension) (09/18/2011) POA: Yes Assessment: borderline. Observe with activity. Pain also affecting. Same meds for now Plan: Monitor with BID checks. Continue  hydralazine and metoprolol.  Cardiomyopathy, ischemic (09/20/2011) POA: Yes Assessment:  Plan: Monitor for fluid overload. Continue hydralazine and check daily weights. On lopressor but no ace noted likely due to renal insufficiency.  Periph vascular disorder (09/18/2011) POA: Yes  Assessment:  Plan: Pressure relief measures and monitor for breakdown.  Chronic Kidney Disease: POA: Yes  Assessment: BUN/Creatinine @47 /1.72 at admission. Currently a little worse @ 53/2.16. Likely due to dye studies.  Plan: Monitor with routine checks. Avoid nephrotoxic med's. IVF  Insomnia: Schedule trazodone. IVF playing a role as he's having a lot of frequency.

## 2011-09-30 NOTE — Progress Notes (Signed)
Physical Therapy Session Note Patient Details  Name: Todd Cook MRN: [id number] Date of Birth: 09/24/1941  Today's Date: 09/30/2011 Time: 1430-1530 Time: 4401-0272 Time Calculation (min): 60 min and 45 min  Precautions: Precautions Precautions: Other (comment) Precaution Comments: L BKA, Falls Required Braces or Orthoses: No Restrictions Weight Bearing Restrictions: Yes LLE Weight Bearing: Non weight bearing  Short Term Goals: PT Short Term Goal 1: Patient will perform at Supervised Level of assistance for all Mobility, Transfers, Balance, Safety and Gait by time of discharge  PT Short Term Goal 2: Patient will perform gait x 50' using RW and Supervised Assist in controlled environment PT Short Term Goal 3: Patient will propel manual w/c x 150' with Supervised assist in controlled environment  Vital Signs Therapy Vitals Temp: 98.4 F (36.9 C) Temp src: Oral Pulse Rate: 61  Resp: 18  BP: 135/73 mmHg Patient Position, if appropriate: Sitting Oxygen Therapy SpO2: 99 % O2 Device: None (Room air)  Pain: Patient reports pain ranging from 1-6 out of 10           Nursing notified for pain meds  Other Treatments  :  Therapeutic Activities to include; Bed mobility with focus on positioning of body and hand placement, Transfer Training with Verbal Cues for safety and sequencing, Balance at [facility] of Bed in sitting static and dynamic as well as static standing balance using RW for support.  Therapeutic Exercise to include; L LE stretching hip and knee flat in supine with instructions to perform x 20 minutes 2x/day, L LE lifts into flexion and AAROM into Abduction, L knee extensions.   Gait Training; in parallel bars with hopping 3 x 15' with rests in between.  Pre-stair training with patient using weight balance on hands in parallel bars and high, slow lift of R foot off and back to floor 2x.  Gait using RW x 10' and only one time of min-A while turning to sit at edge of bed.  W/C  mobility x 150' in corridor and able to negotiate turn without assist.     Therapy/Group: Individual Therapy  Jodelle Gross 09/30/2011, 5:15 PM

## 2011-10-01 LAB — GLUCOSE, CAPILLARY
Glucose-Capillary: 232 mg/dL — ABNORMAL HIGH (ref 70–99)
Glucose-Capillary: 182 mg/dL — ABNORMAL HIGH (ref 70–99)

## 2011-10-01 NOTE — Progress Notes (Signed)
C/o pain in his L amputee- better today  Todd Cook is an 70 y.o. Male. With history of CKD, DM, PVD with chronic left great toe ulcer; admitted 09/18/11 with chest pain due to NSTEMI. Pt underwent PTCA with stent placement on 09/20/11 by Dr. Sanjuana Kava. Evaluated by Dr Darrick Penna and as aortogram with evidence of severe PVD, surgery recommended. 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 Post op with ABLA with Hgb down to 6.7 requiring transfusion. Blood culture X 2 and one positive for strep viridans. ID feels likely contaminated and monitor for now.     Review of Systems  Constitutional: Negative for fever and chills.  HENT: Positive for hearing loss.  Eyes: Negative.  Respiratory: Negative. Negative for cough.  Cardiovascular: Negative.  Musculoskeletal: Positive for joint pain.  Skin: Negative.  Psychiatric/Behavioral: The patient has insomnia.  right leg painful but showing improvement  Past Medical History   Diagnosis  Date   .  Blood transfusion    .  Angina    .  Chronic kidney disease    .  Anemia    .  Diabetes mellitus    .  Peripheral vascular disease    .  Hypertension    .  Coronary artery disease    .  Myocardial infarction    .  Shortness of breath    .  Gout      left foot    Past Surgical History   Procedure  Date   .  Coronary artery bypass graft  10 years ago   .  Knee arthroplasty      2000   .  Cataract extraction, bilateral    .  Eye surgery    .  Cardiac catheterization    .  Amputation  09/26/2011     Procedure: AMPUTATION BELOW KNEE; Surgeon: Sherren Kerns, MD; Location: [facility] OR; Service: Vascular; Laterality: Left;   History reviewed. No pertinent family history.  Social History: reports that he has never smoked. He does not have any smokeless tobacco history on file. He reports that he does not drink alcohol or use illicit drugs.  Allergies:  Allergies   Allergen  Reactions   .  Altace     No current outpatient prescriptions on file as of  09/29/2011.   Home:  Home Living  Lives With: Spouse  Receives Help From: Family  Type of Home: House  Home Layout: One level  Home Access: Stairs to enter  [employer] of Steps: 1  Bathroom Shower/Tub: Tub/shower unit;Door  [employer] Toilet: Standard  Bathroom Accessibility: Yes  How Accessible: Accessible via walker  Home Adaptive Equipment: Shower chair with back  Functional History:  Prior Function  Level of Independence: Independent with basic ADLs;Independent with homemaking with ambulation;Independent with homemaking with wheelchair;Independent with gait  Able to Take Stairs?: Yes  Driving: Yes  Vocation: Retired  Functional Status:  Mobility:  Bed Mobility  Bed Mobility: Yes  Supine to Sit: 4: Min assist;HOB flat;Other (comment) (Min guard A for bridging to EOB after VC's)  Supine to Sit Details (indicate cue type and reason): struggle to get up onto elbows then powered up well  Transfers  Transfers: Yes  Sit to Stand: 4: Min assist;From bed;1: +1 Total assist  Sit to Stand Details (indicate cue type and reason): vc's for hand placement;visual cues for technique; manual A for forward w/shift  Stand to Sit: 4: Min assist;To chair/3-in-1  Stand to Sit Details: vc for hand placement  Squat  Pivot Transfers: 1: +2 Total assist;Patient percentage (comment);From elevated surface;With armrests (pt 80%)  Squat Pivot Transfer Details (indicate cue type and reason): pt felt much more confortable with squat-pivot transfer than full stand. Educated pt on chair set-up to right for ease of transfers  Ambulation/Gait  Ambulation/Gait: Yes  Ambulation/Gait Assistance: 3: Mod assist;Patient percentage (comment) (pt =70%; with RW)  Ambulation/Gait Assistance Details (indicate cue type and reason): vc/visual cues for "swing to " technique; manual A to maneuver RW  Ambulation Distance (Feet): 10 Feet  Assistive device: Rolling walker  Gait Pattern: (swing to gait pattern)    Stairs: No  Wheelchair Mobility  Wheelchair Mobility: No  ADL:  ADL  Eating/Feeding: Simulated;Set up  Where Assessed - Eating/Feeding: Edge of bed  Grooming: Performed  Where Assessed - Grooming: Sitting, bed  Upper Body Bathing: Simulated;Chest;Right arm;Left arm;Abdomen;Set up  Where Assessed - Upper Body Bathing: Sitting, bed  Lower Body Bathing: Simulated;Maximal assistance  Where Assessed - Lower Body Bathing: Sit to stand from bed  Upper Body Dressing: Performed;Set up  Upper Body Dressing Details (indicate cue type and reason): with donning gown  Where Assessed - Upper Body Dressing: Sitting, bed  Lower Body Dressing: Simulated;Maximal assistance  Where Assessed - Lower Body Dressing: Sit to stand from bed  Toilet Transfer: Not assessed  Toilet Transfer Method: Not assessed  Toileting - Clothing Manipulation: Not assessed  Where Assessed - Toileting Clothing Manipulation: Not assessed  Toileting - Hygiene: Not assessed  Where Assessed - Toileting Hygiene: Not assessed  Tub/Shower Transfer: Not assessed  Tub/Shower Transfer Method: Not assessed  Equipment Used: Rolling walker  Cognition:  Cognition  Arousal/Alertness: Awake/alert  Orientation Level: Oriented X4  Cognition  Arousal/Alertness: Awake/alert  Overall Cognitive Status: Appears within functional limits for tasks assessed  Orientation Level: Oriented X4  BP 155/69  Pulse 78  Temp(Src) 98.3 F (36.8 C) (Oral)  Resp 16  Ht 5\' 10"  (1.778 m)  Wt 168 lb 3.4 oz (76.3 kg)  BMI 24.14 kg/m2  SpO2 98%    Physical Exam  Constitutional: He appears well-developed.  HENT:  Mouth/Throat: Abnormal dentition.  Musculoskeletal:  Legs: Neurological: A sensory deficit is present.  Reflex Scores:  Tricep reflexes are 1+ on the right side and 1+ on the left side.  Bicep reflexes are 1+ on the right side and 1+ on the left side.  Brachioradialis reflexes are 1+ on the right side and 1+ on the left side.  Patellar  reflexes are 1+ on the right side.  Achilles reflexes are 1+ on the right side. Alert and oriented x3 with cues.. Stocking glove sensory loss to mid calf RLE, Some tingling in both hands. Strength in upper extremities 4-5/5. RLE grossly 4-5/5 with some slight diminishment distally. Left leg 2/5 at hip. I didn't test knee.  Patient also is hard of hearing. Still slightly disoriented.  Marland Kitchen  Post Admission Physician Evaluation:  1. Functional deficits secondary to Left BKA 2. Patient admitted to receive collaborative, interdisciplinary care between the physiatrist, rehab nursing staff, and therapy team. 3. Patient's level of medical complexity and substantial therapy needs in context of that medical necessity cannot be provided at a lesser intensity of care. 4. Patient has experienced substantial functional loss from his/her baseline. Please see above functional evals for premorbid and current levels. Judging by the patient's diagnosis, physical exam, and functional history, the patient has potential for functional progress which will result in measurable gains while on inpatient rehab. These gains will be of  substantial and practical use upon discharge in facilitating mobility and self-care at the household level. 5. Physiatrist will provide 24 hour management of medical needs as well as oversight of the therapy plan/treatment and provide guidance as appropriate regarding the interaction of the two. 6. 24 hour rehab nursing will assist in the management of bladder management, bowel management, safety, skin/wound care, pain management and patient education and help integrate therapy concepts, techniques,education, etc. 7. PT will assess and treat for: balance, locomotion, strength and transferring. Goals are: independent with assistive device/ to perhaps supervision. OT will assess and treat for: bathing, bowel/bladder control, dressing, psychosocial adjustment, toileting and transferring . Goals are:  independent with assistive device to supervision  8. SLP will potentially assess and treat for: cognition and awareness. Goals are: independent. (Will allow OT and PT observe for safety needs for now before making a formal consult) 9. Case Management and Social Worker will assess and treat for psychological issues and discharge planning. 10. Team conference will be held weekly to assess progress toward goals and to determine barriers to discharge. 11. Patient will receive at least 3 hours of therapy per day at least 5 days per week. 12. ELOS and Prognosis: 7-10 days excellent Medical Problem List and Plan:  1. DVT Prophylaxis/Anticoagulation: Pharmaceutical: Lovenox  2. Pain Management: Pain limiting activity. Will add long acting pain medication to help with consistent pain relief...need to carefully watch cognition however. Patient is already massaging leg, and I encouraged further sensory feedback to reduce pain. Discussed w/pt and his wife 3. Mood: Monitor. Remains motivated.  Patient Active Hospital Problem List: Non-STEMI (non-ST elevated myocardial infarction) (09/18/2011) POA: Yes Assessment:  Plan Continue Aspirin, plavix and crestor DM (diabetes mellitus) type I uncontrolled  Assessment: Blood sugars variable currently.  , Plan: Monitor with AC/HS cbg checks. Use SSI for tighter blood sugar control and to promote wound healing. Continue tradjenta, Increase amaryl to home dose of 4mg  and D/Clantus Iron deficiency anemia (09/18/2011) POA: Unknown Assessment: Now with acute on chronic anemia with last hgb @6 .7 and patient transfused yesterday.  Check stool guaiacs. Add iron supplement.  Plan: Check stool guaiacs. Add iron supplement.  Gout (09/18/2011) POA: Yes Assessment: no symptoms currently  Plan: Continue allopurinol daily.  HTN (hypertension) (09/18/2011) POA: Yes Assessment: borderline. Observe with activity. Pain also affecting. Same meds for now Plan: Monitor with BID checks.  Continue hydralazine and metoprolol.  Cardiomyopathy, ischemic (09/20/2011) POA: Yes Assessment:  Plan: Monitor for fluid overload. Continue hydralazine and check daily weights. On lopressor but no ace noted likely due to renal insufficiency.  Periph vascular disorder (09/18/2011) POA: Yes  Assessment:  Plan: Pressure relief measures and monitor for breakdown.  Chronic Kidney Disease: POA: Yes  Assessment: BUN/Creatinine @47 /1.72 at admission. Currently a little worse @ 53/2.16. Likely due to dye studies.  Plan: Monitor with routine checks. Avoid nephrotoxic med's. IVF  Insomnia: Schedule trazodone. IVF playing a role as he's having a lot of frequency.

## 2011-10-01 NOTE — Progress Notes (Signed)
Physical Therapy Session Note  Patient Details  Name: DERWOOD BECRAFT MRN: [id number] Date of Birth: 1941/05/31  Today's Date: 10/01/2011 Time : 1305 1355 Precautions: Precautions Precautions: Other (comment) Precaution Comments: L BKA, Falls Required Braces or Orthoses: No Restrictions Weight Bearing Restrictions: Yes LLE Weight Bearing: Non weight bearing  Short Term Goals: PT Short Term Goal 1: Patient will perform at Supervised Level of assistance for all Mobility, Transfers, Balance, Safety and Gait by time of discharge  PT Short Term Goal 2: Patient will perform gait x 50' using RW and Supervised Assist in controlled environment PT Short Term Goal 3: Patient will propel manual w/c x 150' with Supervised assist in controlled environment  Skilled Therapeutic Interventions/Progress Updates: Focus of treatment: Gait training with RW in controlled environment          Pain No c/o pain at this session Lt BKA    Locomotion : sit to stand from mat or wc min assist with vcs for hand placement; Gait 8 feet, 20 feet X 2 RW min assist; wc mobility 150 feet SBA in controlled environment                 Other Treatments    Therapy/Group: Individual Therapy  Aydyn Testerman,JIM 10/01/2011, 1:30 PM

## 2011-10-01 NOTE — Progress Notes (Signed)
Occupational Therapy Session Note  Patient Details  Name: Todd Cook MRN: [id number] Date of Birth: 09-09-1941  Today's Date: 10/01/2011  Time:  8:15 to 9:15 Time calculation (min): 60   Skilled Therapeutic Interventions/Progress Updates:  ADL in w/c at sink to address deficits of self care and balance after L BKA and deconditioning.  Today's focus on balance and endurance for self care.    Time: 1440-1520 Time Calculation (min): 40 min  Precautions:   Skilled Therapeutic Interventions/Progress Updates:Due to L BKA, patient will benefit from more opportunities to increase the following Balance (today EOM) UE strengthening and transfers);  Wife, son and dtr in law present for session today    Pain 3/10 in residual left limb per patient.  Patient informed RN  Therapy/Group: Individual Therapy  Bud Face [facility] 10/01/2011, 5:11 PM

## 2011-10-01 NOTE — Progress Notes (Signed)
Physical Therapy Session Note  Patient Details  Name: LYAL HUSTED MRN: [id number] Date of Birth: 21-Oct-1941  Today's Date: 10/01/2011 Time: 1010-1100 Time Calculation (min): 50 min  Precautions: Precautions Precautions: Other (comment) Precaution Comments: L BKA, Falls Required Braces or Orthoses: No Restrictions Weight Bearing Restrictions: Yes LLE Weight Bearing: Non weight bearing (PATIENT WITH LEFT BKA)  Short Term Goals: PT Short Term Goal 1: Patient will perform at Supervised Level of assistance for all Mobility, Transfers, Balance, Safety and Gait by time of discharge  PT Short Term Goal 2: Patient will perform gait x 50' using RW and Supervised Assist in controlled environment PT Short Term Goal 3: Patient will propel manual w/c x 150' with Supervised assist in controlled environment  Skilled Therapeutic Interventions/Progress Updates: Focus of treatment: transfer training; review therapeutic exercise program for L BKA AROM/strengthening           Pain 2/10 L BKA- meds given Mobility Transfers scoot/pivot wc to mat with vcs for wc placement,brakes, armrrest: SBA for transfer; sit to supine SBA   Locomotion wheelchair mobility- 120 feet SBA with cues for direction finding (controlled environment)              Exercises- pt able to attain full knee extension in supine on Lt.; Pt performed Lt BKA  Exercises X 10 in supine with tactile cues - no increased complaint of pain    Other Treatments  Upper extremity ergonometer X 5 minutes Level 2 (O2 sats post exercise > 92% RA)  Therapy/Group: Individual Therapy  Ibrohim Simmers,JIM 10/01/2011, 10:27 AM

## 2011-10-02 DIAGNOSIS — Z89519 Acquired absence of unspecified leg below knee: Secondary | ICD-10-CM

## 2011-10-02 LAB — DIFFERENTIAL
Eosinophils Relative: 4 % (ref 0–5)
Lymphocytes Relative: 21 % (ref 12–46)
Lymphs Abs: 1.6 10*3/uL (ref 0.7–4.0)
Monocytes Absolute: 0.7 10*3/uL (ref 0.1–1.0)
Monocytes Relative: 9 % (ref 3–12)

## 2011-10-02 LAB — COMPREHENSIVE METABOLIC PANEL
Albumin: 2.4 g/dL — ABNORMAL LOW (ref 3.5–5.2)
Alkaline Phosphatase: 84 U/L (ref 39–117)
BUN: 32 mg/dL — ABNORMAL HIGH (ref 6–23)
Calcium: 8.9 mg/dL (ref 8.4–10.5)
Potassium: 4.9 mEq/L (ref 3.5–5.1)
Sodium: 135 mEq/L (ref 135–145)
Total Protein: 6.7 g/dL (ref 6.0–8.3)

## 2011-10-02 LAB — GLUCOSE, CAPILLARY
Glucose-Capillary: 162 mg/dL — ABNORMAL HIGH (ref 70–99)
Glucose-Capillary: 267 mg/dL — ABNORMAL HIGH (ref 70–99)
Glucose-Capillary: 162 mg/dL — ABNORMAL HIGH (ref 70–99)

## 2011-10-02 LAB — CBC
HCT: 28.2 % — ABNORMAL LOW (ref 39.0–52.0)
Hemoglobin: 9.3 g/dL — ABNORMAL LOW (ref 13.0–17.0)
MCV: 89.5 fL (ref 78.0–100.0)
RBC: 3.15 MIL/uL — ABNORMAL LOW (ref 4.22–5.81)
RDW: 14.3 % (ref 11.5–15.5)
WBC: 7.4 10*3/uL (ref 4.0–10.5)

## 2011-10-02 NOTE — Progress Notes (Signed)
Physical Therapy Session Note  Patient Details  Name: Todd Cook MRN: [id number] Date of Birth: 21-Oct-1941  Today's Date: 10/02/2011 Time: 0930-1015 Time Calculation (min): 45 min  Precautions: Precautions Precautions: Fall Precaution Comments: L BKA, Falls Required Braces or Orthoses: No Restrictions Weight Bearing Restrictions: Yes LLE Weight Bearing: Non weight bearing (patient with a left BKA.)  Skilled Therapeutic Interventions/Progress Updates: Pt educated on stair transfer, went up backwards with min A but could not bring RW up or allow PT to do so as he felt as if he was falling. Stepped down with min A.  Discussed benefit of ramp with pt and wife who report sons can build one prior to D/C. Also discussed desensitization program with wife/pt was unable to repeat it from morning session and makes frequent jokes which may be compensation for memory deficits.      Vital Signs 148/58, 98%, HR 67  Pain- 2/10 residual limb, premedicated  Mobility: transfers squat or stand pivot with RW S  Locomotion  W/C mobility- 250 with 2 rest breaks, c/o fatigue, later pushed for energy conservation        Therapy/Group: Individual Therapy  Michaelene Song 10/02/2011, 10:19 AM

## 2011-10-02 NOTE — Progress Notes (Signed)
PT DC'D TO INPATIENT REHAB. Todd Cook 10/02/2011 [phone] OR [phone]

## 2011-10-02 NOTE — Progress Notes (Signed)
Patient is alert and oriented x3. He complained of pain and was given pain meds x1 which provided relief.. Patient's blood glucose levels have required sliding scale insulin coverage at all 3 meals this shift. Surgical wound healing well. Continue current plan of care.

## 2011-10-02 NOTE — Progress Notes (Signed)
[facility]patient Rehabilitation Center Individual Statement of Services  Patient Name:  Todd Cook  Date:  10/02/2011  Welcome to the Inpatient Rehabilitation Center.  Our goal is to provide you with an individualized program based on your diagnosis and situation, designed to meet your specific needs.  With this comprehensive rehabilitation program, you will be expected to participate in at least 3 hours of rehabilitation therapies Monday-Friday, with modified therapy programming on the weekends.  Your rehabilitation program will include the following services:  Physical Therapy (PT), Occupational Therapy (OT), 24 hour per day rehabilitation nursing, Therapeutic Recreaction (TR), Case Management (RN and Child psychotherapist), Rehabilitation Medicine, Nutrition Services and Pharmacy Services  Weekly team conferences will be held on Wednesdays to discuss your progress.  Your RN Case Designer, television/film set will talk with you frequently to get your input and to update you on team discussions.  Team conferences with you and your family in attendance may also be held.  Estimated length of stay:7-10 days  Overall expected outcome:Supervision - modified independent  Depending on your progress and recovery, your program may change.  Your RN Case Estate agent will coordinate services and will keep you informed of any changes.  Your RN Sports coach and SW names and contact numbers are listed  below.  The following services may also be recommended but are not provided by the Inpatient Rehabilitation Center:   Driving Evaluations  Home Health Rehabiltiation Services  Outpatient Rehabilitatation [facility]  Vocational Rehabilitation   Arrangements will be made to provide these services after discharge if needed.  Arrangements include referral to agencies that provide these services.  Your insurance has been verified to be: [employer] Your primary doctor is:Dr. Nila Nephew  Pertinent  information will be shared with your doctor and your insurance company.  Case Manager: Lutricia Horsfall, [facility] [phone]  Social Worker:  Dossie Der, [place] [phone]  Information discussed with and copy given to patient by: Meryl Dare, 10/02/2011

## 2011-10-02 NOTE — Progress Notes (Signed)
C/o pain in his L amputation - better today  Todd Cook is an 70 y.o. Male. With history of CKD, DM, PVD with chronic left great toe ulcer; admitted 09/18/11 with chest pain due to NSTEMI. Pt underwent PTCA with stent placement on 09/20/11 by Dr. Sanjuana Kava. Evaluated by Dr Darrick Penna and as aortogram with evidence of severe PVD, surgery recommended. 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 Post op with ABLA with Hgb down to 6.7 requiring transfusion. Blood culture X 2 and one positive for strep viridans. ID feels likely contaminated and monitor for now.     Review of Systems  Constitutional: Negative for fever and chills.  HENT: Positive for hearing loss.  Eyes: Negative.  Respiratory: Negative. Negative for cough.  Cardiovascular: Negative.  Musculoskeletal: Positive for joint pain. Mainly left phantom pain which is improving Skin: Negative.  Psychiatric/Behavioral: The patient has insomnia.  right leg painful but showing improvement  Past Medical History   Diagnosis  Date   .  Blood transfusion    .  Angina    .  Chronic kidney disease    .  Anemia    .  Diabetes mellitus    .  Peripheral vascular disease    .  Hypertension    .  Coronary artery disease    .  Myocardial infarction    .  Shortness of breath    .  Gout      left foot    Past Surgical History   Procedure  Date   .  Coronary artery bypass graft  10 years ago   .  Knee arthroplasty      2000   .  Cataract extraction, bilateral    .  Eye surgery    .  Cardiac catheterization    .  Amputation  09/26/2011     Procedure: AMPUTATION BELOW KNEE; Surgeon: Sherren Kerns, MD; Location: [facility] OR; Service: Vascular; Laterality: Left;   History reviewed. No pertinent family history.  Social History: reports that he has never smoked. He does not have any smokeless tobacco history on file. He reports that he does not drink alcohol or use illicit drugs.  Allergies:  Allergies   Allergen  Reactions   .  Altace     No  current outpatient prescriptions on file as of 09/29/2011.   Home:  Home Living  Lives With: Spouse  Receives Help From: Family  Type of Home: House  Home Layout: One level  Home Access: Stairs to enter  [employer] of Steps: 1  Bathroom Shower/Tub: Tub/shower unit;Door  [employer] Toilet: Standard  Bathroom Accessibility: Yes  How Accessible: Accessible via walker  Home Adaptive Equipment: Shower chair with back  Functional History:  Prior Function  Level of Independence: Independent with basic ADLs;Independent with homemaking with ambulation;Independent with homemaking with wheelchair;Independent with gait  Able to Take Stairs?: Yes  Driving: Yes  Vocation: Retired  Functional Status:  Mobility:  Bed Mobility  Bed Mobility: Yes  Supine to Sit: 4: Min assist;HOB flat;Other (comment) (Min guard A for bridging to EOB after VC's)  Supine to Sit Details (indicate cue type and reason): struggle to get up onto elbows then powered up well  Transfers  Transfers: Yes  Sit to Stand: 4: Min assist;From bed;1: +1 Total assist  Sit to Stand Details (indicate cue type and reason): vc's for hand placement;visual cues for technique; manual A for forward w/shift  Stand to Sit: 4: Min assist;To chair/3-in-1  Stand to  Sit Details: vc for hand placement  Squat Pivot Transfers: 1: +2 Total assist;Patient percentage (comment);From elevated surface;With armrests (pt 80%)  Squat Pivot Transfer Details (indicate cue type and reason): pt felt much more confortable with squat-pivot transfer than full stand. Educated pt on chair set-up to right for ease of transfers  Ambulation/Gait  Ambulation/Gait: Yes  Ambulation/Gait Assistance: 3: Mod assist;Patient percentage (comment) (pt =70%; with RW)  Ambulation/Gait Assistance Details (indicate cue type and reason): vc/visual cues for "swing to " technique; manual A to maneuver RW  Ambulation Distance (Feet): 10 Feet  Assistive device: Rolling walker    Gait Pattern: (swing to gait pattern)  Stairs: No  Wheelchair Mobility  Wheelchair Mobility: No  ADL:  ADL  Eating/Feeding: Simulated;Set up  Where Assessed - Eating/Feeding: Edge of bed  Grooming: Performed  Where Assessed - Grooming: Sitting, bed  Upper Body Bathing: Simulated;Chest;Right arm;Left arm;Abdomen;Set up  Where Assessed - Upper Body Bathing: Sitting, bed  Lower Body Bathing: Simulated;Maximal assistance  Where Assessed - Lower Body Bathing: Sit to stand from bed  Upper Body Dressing: Performed;Set up  Upper Body Dressing Details (indicate cue type and reason): with donning gown  Where Assessed - Upper Body Dressing: Sitting, bed  Lower Body Dressing: Simulated;Maximal assistance  Where Assessed - Lower Body Dressing: Sit to stand from bed  Toilet Transfer: Not assessed  Toilet Transfer Method: Not assessed  Toileting - Clothing Manipulation: Not assessed  Where Assessed - Toileting Clothing Manipulation: Not assessed  Toileting - Hygiene: Not assessed  Where Assessed - Toileting Hygiene: Not assessed  Tub/Shower Transfer: Not assessed  Tub/Shower Transfer Method: Not assessed  Equipment Used: Rolling walker  Cognition:  Cognition  Arousal/Alertness: Awake/alert  Orientation Level: Oriented X4  Cognition  Arousal/Alertness: Awake/alert  Overall Cognitive Status: Appears within functional limits for tasks assessed  Orientation Level: Oriented X4  BP 140/66  Pulse 73  Temp(Src) 97.4 F (36.3 C) (Oral)  Resp 18  Ht 5\' 10"  (1.778 m)  Wt 76.2 kg (167 lb 15.9 oz)  BMI 24.10 kg/m2  SpO2 96%    Physical Exam  Constitutional: He appears well-developed.  HENT:  Mouth/Throat: Abnormal dentition.  Musculoskeletal:  Legs: Neurological: A sensory deficit is present.  Reflex Scores:  Tricep reflexes are 1+ on the right side and 1+ on the left side.  Bicep reflexes are 1+ on the right side and 1+ on the left side.  Brachioradialis reflexes are 1+ on the right  side and 1+ on the left side.  Patellar reflexes are 1+ on the right side.  Achilles reflexes are 1+ on the right side. Alert and oriented x3 with cues.. Stocking glove sensory loss to mid calf RLE, Some tingling in both hands. Strength in upper extremities 4-5/5. RLE grossly 4-5/5 with some slight diminishment distally. Left leg 2/5 at hip. I didn't test knee.  Patient also is hard of hearing. Still slightly disoriented.  Marland Kitchen  Post Admission Physician Evaluation:  1. Functional deficits secondary to Left BKA 2. Patient admitted to receive collaborative, interdisciplinary care between the physiatrist, rehab nursing staff, and therapy team. 3. Patient's level of medical complexity and substantial therapy needs in context of that medical necessity cannot be provided at a lesser intensity of care. 4. Patient has experienced substantial functional loss from his/her baseline. Please see above functional evals for premorbid and current levels. Judging by the patient's diagnosis, physical exam, and functional history, the patient has potential for functional progress which will result in measurable gains while  on inpatient rehab. These gains will be of substantial and practical use upon discharge in facilitating mobility and self-care at the household level. 5. Physiatrist will provide 24 hour management of medical needs as well as oversight of the therapy plan/treatment and provide guidance as appropriate regarding the interaction of the two. 6. 24 hour rehab nursing will assist in the management of bladder management, bowel management, safety, skin/wound care, pain management and patient education and help integrate therapy concepts, techniques,education, etc. 7. PT will assess and treat for: balance, locomotion, strength and transferring. Goals are: independent with assistive device/ to perhaps supervision. OT will assess and treat for: bathing, bowel/bladder control, dressing, psychosocial adjustment,  toileting and transferring . Goals are: independent with assistive device to supervision  8. SLP will potentially assess and treat for: cognition and awareness. Goals are: independent. (Will allow OT and PT observe for safety needs for now before making a formal consult) 9. Case Management and Social Worker will assess and treat for psychological issues and discharge planning. 10. Team conference will be held weekly to assess progress toward goals and to determine barriers to discharge. 11. Patient will receive at least 3 hours of therapy per day at least 5 days per week. 12. ELOS and Prognosis: 7-10 days excellent Medical Problem List and Plan:  1. DVT Prophylaxis/Anticoagulation: Pharmaceutical: Lovenox  2. Pain Management: Pain limiting activity. Will add long acting pain medication to help with consistent pain relief...need to carefully watch cognition however. Patient is already massaging leg, and I encouraged further sensory feedback to reduce pain. Discussed w/pt and his wife 3. Mood: Monitor. Remains motivated.  Patient Active Hospital Problem List: Non-STEMI (non-ST elevated myocardial infarction) (09/18/2011) POA: Yes Assessment:  Plan Continue Aspirin, plavix and crestor DM (diabetes mellitus) type I uncontrolled  Assessment: Blood sugars variable currently.  , Plan: Monitor with AC/HS cbg checks. Use SSI for tighter blood sugar control and to promote wound healing. Continue tradjenta, Increase amaryl to home dose of 4mg  and D/Clantus Iron deficiency anemia (09/18/2011) POA: Unknown Assessment:Hgb stable post transfusion vs today  Check stool guaiacs. Add iron supplement.  Plan: Check stool guaiacs. Add iron supplement.  Gout (09/18/2011) POA: Yes Assessment: no symptoms currently  Plan: Continue allopurinol daily.  HTN (hypertension) (09/18/2011) POA: Yes Assessment: borderline. Observe with activity. Pain also affecting. Same meds for now Plan: Monitor with BID checks. Continue  hydralazine and metoprolol.  Cardiomyopathy, ischemic (09/20/2011) POA: Yes Assessment:  Plan: Monitor for fluid overload. Continue hydralazine and check daily weights. On lopressor but no ace noted likely due to renal insufficiency.  Periph vascular disorder (09/18/2011) POA: Yes  Assessment:  Plan: Pressure relief measures and monitor for breakdown.  Chronic Kidney Disease: POA: Yes  Assessment: BUN/Creatinine @47 /1.72 at admission. Currently a little worse @ 53/2.16. Likely due to dye studies.  Plan: Monitor with routine checks. Avoid nephrotoxic med's. IVF  Insomnia: Schedule trazodone. IVF playing a role as he's having a lot of frequency.

## 2011-10-02 NOTE — Progress Notes (Signed)
Physical Therapy Session Note  Patient Details  Name: Todd Cook MRN: [id number] Date of Birth: September 21, 1941  Today's Date: 10/02/2011 Time: 0805-0900 Time Calculation (min): 55 min  Precautions: Precautions Precautions: Fall Precaution Comments: L BKA, Falls Required Braces or Orthoses: No Restrictions Weight Bearing Restrictions: Yes LLE Weight Bearing: Non weight bearing (patient with a left BKA.)   Skilled Therapeutic Interventions/Progress Updates: amputee patient education, instruction for proper use of RW with gait and sit to stands, patient reports decreased activity tolerance with gait today; Therapeutic exercise performed with LE to increase strength and ROM for functional mobility/preprosthetic training  Goals upgraded based on good progress, pt in agreement, wife not present.        Vital Signs Therapy Vitals Temp: 97.4 F (36.3 C) Temp src: Oral Pulse Rate: 80  (with gait) Resp: 18  BP: 132/71 mmHg (with gait, 128/51 w/ w/c propulsion) Patient Position, if appropriate: Sitting Oxygen Therapy SpO2: 100 % O2 Device: None (Room air) (2/4 dypsnea w/ gait) Pain Pain Assessment Pain Assessment: 0-10 Pain Score:   4 Faces Pain Scale: Hurts little more Pain Type: Surgical pain;Phantom pain Pain Location: Leg Pain Orientation: Left Pain Onset: On-going Pain Intervention(s): RN made aware;MD notified (Comment)  Educated on desensitization, LE positioning to prevent contracture Mobility  Bed Mobility Bed Mobility: Yes Supine to Sit: 7: Independent Sitting - Scoot to Edge of Bed: 7: Independent Sit to Supine - Right: 7: Independent Sit to Supine - Left: 7: Independent Transfers Sit to Stand: With armrests;Other (comment) (to RW, x5) Sit to Stand Details: Verbal cues for technique Stand to Sit: 5: Supervision (to/from w/c) Stand to Sit Details (indicate cue type and reason): Verbal cues for technique Stand Pivot Transfers: Other (comment) (held chair  from moving, armrest removed) Squat Pivot Transfers: 5: Supervision;Other (comment) (one armrest removed) Locomotion  Ambulation Ambulation: Yes Ambulation/Gait Assistance: 5: Supervision Ambulation Distance (Feet): 22 Feet Assistive device: Rolling walker Ambulation/Gait Assistance Details: Verbal cues for technique;Verbal cues for precautions/safety;Verbal cues for gait pattern;Verbal cues for safe use of DME/AE Ambulation/Gait Assistance Details (indicate cue type and reason): with 1 turn Wheelchair Mobility Wheelchair Mobility: Yes Wheelchair Assistance: 5: Supervision Wheelchair Propulsion: Both upper extremities Wheelchair Parts Management: Supervision/cueing;Needs assistance (assist for armrest only) Distance: 150, .modified I controlled environment        Exercises Amputee Exercises Quad Sets: AROM;20 reps Gluteal Sets: Supine;Other reps (comment) (20) Hip Extension: Other reps (comment);Standing;Supine (20x2) Knee Extension: Seated;15 reps;Strengthening;AROM   Therapy/Group: Individual Therapy  Michaelene Song 10/02/2011, 9:05 AM

## 2011-10-02 NOTE — Progress Notes (Signed)
Patient information reviewed and entered into [facility] system by Lilit Cinelli, RN, CRRN, PPS Coordinator.  Information including medical coding and functional independence measure will be reviewed and updated through discharge.     Per nursing patient was given "Data Collection Information Summary for Patients in Inpatient Rehabilitation Facilities with attached "Privacy Act Statement-Health Care Records" upon admission.   

## 2011-10-02 NOTE — Progress Notes (Signed)
Occupational Therapy Session Note  Patient Details  Name: Todd Cook MRN: [id number] Date of Birth: 1941/04/10  Today's Date: 10/02/2011 Time: 4098-1191 Time Calculation (min): 53 min  Precautions: Precautions Precautions: Fall Precaution Comments: L BKA, Falls Required Braces or Orthoses: No Restrictions Weight Bearing Restrictions: Yes LLE Weight Bearing: Non weight bearing (patient with a left BKA.)  Short Term Goals:  STG = LTG due to length of stay  Skilled Therapeutic Interventions/Progress Updates:    Pt seen for ADL retraining at sink level.  Focus on sit to stand, stand to sit with min cues for safety.  Encouraged pt to stand to adjust pants and performing grooming in standing.  Steady assist to close supervision in standing with bathing periarea.  Pt with questions regarding phantom limb pain and size of residual limb.  Pain Pain Assessment Pain Assessment: 0-10 Pain Score:   4 Faces Pain Scale: Hurts little more Pain Type: Surgical pain;Phantom pain Pain Location: Leg Pain Orientation: Left Pain Onset: On-going Pain Intervention(s): RN made aware  Therapy/Group: Individual Therapy  Olam Idler. 10/02/2011, 12:13 PM

## 2011-10-02 NOTE — Progress Notes (Signed)
Social Work Assessment and Plan Assessment and Plan  Patient Name: Todd Cook  [id number] Date: 10/02/2011  Problem List:  Patient Active Problem List  Diagnoses  . Non-STEMI (non-ST elevated myocardial infarction)  . S/P CABG x 4  . Osteomyelitis  . DM (diabetes mellitus) type I uncontrolled, periph vascular disorder  . Diverticulosis of colon  . S/P carotid endarterectomy  . Iron deficiency anemia  . Gout attack  . HTN (hypertension)  . Nephrolithiasis  . Cardiomyopathy, ischemic  . PVD (peripheral vascular disease)    Past Medical History:  Past Medical History  Diagnosis Date  . Blood transfusion   . Angina   . Chronic kidney disease   . Anemia   . Diabetes mellitus   . Peripheral vascular disease   . Hypertension   . Coronary artery disease   . Myocardial infarction   . Shortness of breath   . Gout     left foot    Past Surgical History:  Past Surgical History  Procedure Date  . 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 Knee arthroplasty     2000  . Cataract extraction, bilateral   . Eye surgery   . Cardiac catheterization   . Amputation 09/26/2011    Procedure: AMPUTATION BELOW KNEE;  Surgeon: Sherren Kerns, MD;  Location: [facility] OR;  Service: Vascular;  Laterality: Left;    Discharge Planning  Discharge Planning Living Arrangements: Spouse/significant other Support Systems: Spouse/significant other;Children;Church/faith community;Friends/neighbors Assistance Needed: Pt may require some assistance but hopes not to need too much hlep from wife.  He does not want to burden her. Do you have any problems obtaining your medications?: No Type of Residence: Private residence Patient expects to be discharged to:: Home with wife providing care Case Management Consult Needed: Yes (Comment) (RNCM already following)  Social/Family/Support Systems Social/Family/Support Systems Patient Roles: Parent;Spouse Anticipated Caregiver: Spouse and  children Anticipated Caregiver's Contact Information: See above Ability/Limitations of Caregiver: Wife elderly and small Caregiver Availability: 24/7  Employment Status Employment Status Employment Status: Retired Fish farm manager Issues: NO issues Guardian/Conservator: None  Abuse/Neglect Abuse/Neglect Assessment (Assessment to be complete while patient is alone) Physical Abuse: Denies Verbal Abuse: Denies Sexual Abuse: Denies Exploitation of patient/patient's resources: Denies Self-Neglect: Denies  Emotional Status Emotional Status Pt's affect, behavior adn adjustment status: Pt reports he has always been independent and wants to be again. He rports being tired from the therapies this am, didn't realize how weak he is. Recent Psychosocial Issues: Other medical issues Pyschiatric History: No history  Patient/Family Perceptions, Expectations & Goals Pt/Family Perceptions, Expectations and Goals Pt/Family understanding of illness & functional limitations: Pt and wife have a good understanding of his amputation and surgery.  He states: " It had to be wish it woere different but it is what it is." Premorbid pt/family roles/activities: Husband, Father, Grandfather, Retiree, Church member, Chief Financial Officer, etc Anticipated changes in roles/activities/participation: Plans to resume these roles Pt/family expectations/goals: Pt and wife hope to get to mod/I level and only need minimal assist.  Pt wants to be as independent as possible.  He realizes he needs to get stronger after surgery.  Community Resources [employer] Agencies: None Premorbid Home Care/DME Agencies: None Transportation available at discharge: wife or children Resource referrals recommended: Support group (specify) (Amputee Support Group)  Discharge Visual merchandiser Resources: Media planner (specify) ([employer]) Financial Resources: Restaurant manager, fast food  Screen Referred: No Living Expenses: Lives with family Money Management: Patient;Spouse Home Management:  Spouse Patient/Family Preliminary Plans: Return home with wife to assist. Their children also to assist if necessary.  Both hope he will get to mod/i level at discharge.  Clinical Impression:  Pleasant male sitting up in wheelchair, reports how tired he is from therapies.  Wife here to observe and support pt.  Monitor coping Seems to be coping appropriately.        Lucy Chris 10/02/2011

## 2011-10-02 NOTE — Progress Notes (Signed)
Occupational Therapy Session Note  Patient Details  Name: Todd Cook MRN: [id number] Date of Birth: 10/16/1941  Today's Date: 10/02/2011 Time: 7829-5621 Time Calculation (min): 45 min  Precautions: Precautions Precautions: Fall Precaution Comments: L BKA, Falls Required Braces or Orthoses: No Restrictions Weight Bearing Restrictions: No LLE Weight Bearing: Non weight bearing  Short Term Goals:  STG = LTGs due to length of stay.  Skilled Therapeutic Interventions/Progress Updates:    Pt reports no pain.  Engaged in therapeutic exercises to increase UE strength to assist with sit to stand and with transfers.  UE arm bike with increasing resistance and wheelchair pushups 2 reps of 10 to increase activity tolerance and UE strenght..  Functional activity in standing with use of bilateral UE to fold towels, pt with decreased standing balance without arm support at RW requiring rest break and need to sit to complete task.      Therapy/Group: Individual Therapy  Olam Idler. 10/02/2011, 3:35 PM

## 2011-10-03 LAB — CULTURE, BLOOD (ROUTINE X 2)
Culture  Setup Time: 201211142159
Culture: NO GROWTH

## 2011-10-03 LAB — GLUCOSE, CAPILLARY

## 2011-10-03 NOTE — Progress Notes (Signed)
Physical Therapy Session Note  Patient Details  Name: Todd Cook MRN: [id number] Date of Birth: 02-25-1941  Today's Date: 10/03/2011 Time: 0930-1036 Time Calculation (min): 66 min  Precautions: Precautions Precautions: Fall Precaution Comments: Fall Required Braces or Orthoses: No Restrictions Weight Bearing Restrictions: Yes LLE Weight Bearing: Non weight bearing  Short Term Goals: PT Short Term Goal 1: Pt will perform at Supervision level of assistance for all mobility, transfers, balance, safety and gait by time of d/c PT Short Term Goal 2: Pt will gait x 30ft using RW and supervision in controlled environment PT Short Term Goal 3: Pt will propel manual w/c x 123ft with supervision in controlled environment  General Chart Reviewed: Yes Pain Pain Assessment Pain Assessment: No/denies pain Pain Score: 0-No pain Mobility Bed Mobility Bed Mobility: Yes Supine to Sit: 6: Modified Independent Sitting - Scoot to Edge of Bed: 6: Modified Independent Sit to Supine - Right: 6: Modified Independent Transfers Sit to Stand: 5: Min assist, focus on RW position to increase safety Stand to Sit: 5: Min assist, cues to reach back to lower slowly to seated position Squat Pivot Transfers: 4: Min Designer, industrial/product Details: Verbal cues for safe use of DME/AE;Verbal cues for precautions/safety Squat Pivot Transfer Details (indicate cue type and reason): cues for parts management and sequencing of task. Without cues and assist, pt does not manage w/c parts or position chair safely prior to attempting transfer. Locomotion  Ambulation Ambulation: Yes Ambulation/Gait Assistance: 4: min assist Ambulation Distance (Feet): 20 Feet Assistive device: Rolling walker Ambulation/Gait Assistance Details: Verbal cues for safe use of DME/AE;Verbal cues for precautions/safety Ambulation/Gait Assistance Details (indicate cue type and reason): verbal cues to keep walker close, pt tends to  push it too far in front of him resulting in increased risk of fall. Wheelchair W/c mobility supervision >123ft with assistance and cues for parts management Exercises Seated edge of bed on dynadisc ball toss with beach ball, then 500g ball, then 1000g ball for increased abdominal strength, UE strength, balance reactions, reaction time, and activity tolerance. Tactile cues for posture and alignment with verbal cues to "sit tall." Supine quad sets L LE for increased functional strength and knee extension ROM. Supine hip flexion L LE, supine hip abduction on maxi-slide, and supine bridging with bilat knees on bolster for increased functional strength.  Pt states 0/10 pain.  Pt fatigues quickly and requires frequent rest breaks (SpO2 levels often in high 80% when becoming tired, requiring 2-3 min to recover to > 90% on room air; HR remained 58-60bpm).  Verbal cues throughout all mobility for pursed lip breathing to maintain SpO2 > 90%.     Therapy/Group: Individual Therapy  Christianne Dolin 10/03/2011, 11:58 AM

## 2011-10-03 NOTE — Progress Notes (Signed)
Subjective; Busy day yesterday.  Pain better overall  Todd Cook is an 70 y.o. Male. With history of CKD, DM, PVD with chronic left great toe ulcer; admitted 09/18/11 with chest pain due to NSTEMI. Pt underwent PTCA with stent placement on 09/20/11 by Dr. Sanjuana Kava. Evaluated by Dr Darrick Penna and as aortogram with evidence of severe PVD, surgery recommended. 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 Post op with ABLA with Hgb down to 6.7 requiring transfusion. Blood culture X 2 and one positive for strep viridans. ID feels likely contaminated and monitor for now.     Review of Systems  Constitutional: Negative for fever and chills.  HENT: Positive for hearing loss.  Eyes: Negative.  Respiratory: Negative. Negative for cough.  Cardiovascular: Negative.  Musculoskeletal: Positive for joint pain. Mainly left phantom pain which is improving Skin: Negative.  Psychiatric/Behavioral: The patient has insomnia.  right leg painful but showing improvement    Functional History:  Prior Function  Level of Independence: Independent with basic ADLs;Independent with homemaking with ambulation;Independent with homemaking with wheelchair;Independent with gait  Able to Take Stairs?: Yes  Driving: Yes  Vocation: Retired  Functional Status:  Mobility:  Bed Mobility  Bed Mobility: Yes  Supine to Sit: 4: Min assist;HOB flat;Other (comment) (Min guard A for bridging to EOB after VC's)  Supine to Sit Details (indicate cue type and reason): struggle to get up onto elbows then powered up well  Transfers  Transfers: Yes  Sit to Stand: 4: Min assist;From bed;1: +1 Total assist  Sit to Stand Details (indicate cue type and reason): vc's for hand placement;visual cues for technique; manual A for forward w/shift  Stand to Sit: 4: Min assist;To chair/3-in-1  Stand to Sit Details: vc for hand placement  Squat Pivot Transfers: 1: +2 Total assist;Patient percentage (comment);From elevated surface;With armrests (pt  80%)  Squat Pivot Transfer Details (indicate cue type and reason): pt felt much more confortable with squat-pivot transfer than full stand. Educated pt on chair set-up to right for ease of transfers  Ambulation/Gait  Ambulation/Gait: Yes  Ambulation/Gait Assistance: 3: Mod assist;Patient percentage (comment) (pt =70%; with RW)  Ambulation/Gait Assistance Details (indicate cue type and reason): vc/visual cues for "swing to " technique; manual A to maneuver RW  Ambulation Distance (Feet): 10 Feet  Assistive device: Rolling walker  Gait Pattern: (swing to gait pattern)  Stairs: No  Wheelchair Mobility  Wheelchair Mobility: No  ADL:  ADL  Eating/Feeding: Simulated;Set up  Where Assessed - Eating/Feeding: Edge of bed  Grooming: Performed  Where Assessed - Grooming: Sitting, bed  Upper Body Bathing: Simulated;Chest;Right arm;Left arm;Abdomen;Set up  Where Assessed - Upper Body Bathing: Sitting, bed  Lower Body Bathing: Simulated;Maximal assistance  Where Assessed - Lower Body Bathing: Sit to stand from bed  Upper Body Dressing: Performed;Set up  Upper Body Dressing Details (indicate cue type and reason): with donning gown  Where Assessed - Upper Body Dressing: Sitting, bed  Lower Body Dressing: Simulated;Maximal assistance  Where Assessed - Lower Body Dressing: Sit to stand from bed  Toilet Transfer: Not assessed  Toilet Transfer Method: Not assessed  Toileting - Clothing Manipulation: Not assessed  Where Assessed - Toileting Clothing Manipulation: Not assessed  Toileting - Hygiene: Not assessed  Where Assessed - Toileting Hygiene: Not assessed  Tub/Shower Transfer: Not assessed  Tub/Shower Transfer Method: Not assessed  Equipment Used: Rolling walker  Cognition:  Cognition  Arousal/Alertness: Awake/alert  Orientation Level: Oriented X4  Cognition  Arousal/Alertness: Awake/alert  Overall Cognitive Status: Appears  within functional limits for tasks assessed  Orientation Level:  Oriented X4  BP 138/76  Pulse 78  Temp(Src) 98.3 F (36.8 C) (Oral)  Resp 17  Ht 5\' 10"  (1.778 m)  Wt 76.3 kg (168 lb 3.4 oz)  BMI 24.14 kg/m2  SpO2 98%    Physical Exam  Constitutional: He appears well-developed.  HENT:  Mouth/Throat: Abnormal dentition.  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  Moderate edema, no drainage.  A little hypersensitive to touch.  Resolving ecchymosis.  RLE intact, no edema. Neurological: A sensory deficit is present.  Reflex Scores:  Tricep reflexes are 1+ on the right side and 1+ on the left side.  Bicep reflexes are 1+ on the right side and 1+ on the left side.  Brachioradialis reflexes are 1+ on the right side and 1+ on the left side.  Patellar reflexes are 1+ on the right side.  Achilles reflexes are 1+ on the right side. Alert and oriented x3 with cues.. Stocking glove sensory loss to mid calf RLE, Some tingling in both hands. Strength in upper extremities 4-5/5. RLE grossly 4-5/5 with some slight diminishment distally. Left leg 2/5 at hip. I didn't test knee.  L stump bulbous R foot without lesions Patient also is hard of hearing. Still slightly disoriented.  Marland Kitchen  Post Admission Physician Evaluation:  1. Functional deficits secondary to Left BKA 2. Patient admitted to receive collaborative, interdisciplinary care between the physiatrist, rehab nursing staff, and therapy team. 3. Patient's level of medical complexity and substantial therapy needs in context of that medical necessity cannot be provided at a lesser intensity of care. 4. Patient has experienced substantial functional loss from his/her baseline. Please see above functional evals for premorbid and current levels. Judging by the patient's diagnosis, physical exam, and functional history, the patient has potential for functional progress which will result in measurable gains while on inpatient rehab. These gains will be of substantial and practical use upon discharge in  facilitating mobility and self-care at the household level. 5. Physiatrist will provide 24 hour management of medical needs as well as oversight of the therapy plan/treatment and provide guidance as appropriate regarding the interaction of the two. 6. 24 hour rehab nursing will assist in the management of bladder management, bowel management, safety, skin/wound care, pain management and patient education and help integrate therapy concepts, techniques,education, etc. 7. PT will assess and treat for: balance, locomotion, strength and transferring. Goals are: independent with assistive device/ to perhaps supervision. OT will assess and treat for: bathing, bowel/bladder control, dressing, psychosocial adjustment, toileting and transferring . Goals are: independent with assistive device to supervision  8. SLP will potentially assess and treat for: cognition and awareness. Goals are: independent. (Will allow OT and PT observe for safety needs for now before making a formal consult) 9. Case Management and Social Worker will assess and treat for psychological issues and discharge planning. 10. Team conference will be held weekly to assess progress toward goals and to determine barriers to discharge. 11. Patient will receive at least 3 hours of therapy per day at least 5 days per week. 12. ELOS and Prognosis: 7-10 days excellent Medical Problem List and Plan:  1. DVT Prophylaxis/Anticoagulation: Pharmaceutical: Lovenox  2. Pain Management: Pain limiting activity. Will add long acting pain medication to help with consistent pain relief...need to carefully watch cognition however. Patient is already massaging leg, and I encouraged further sensory feedback to reduce pain. Discussed w/pt and his wife 3. Mood: Monitor.  Remains motivated.  Patient Active Hospital Problem List: 4. Non-STEMI (non-ST elevated myocardial infarction) (09/18/2011) POA: Yes Assessment:  Plan Continue Aspirin, plavix and crestor 5. DM  (diabetes mellitus) type I uncontrolled  Assessment: Blood sugars variable currently.  , Plan: Monitor with AC/HS cbg checks. Use SSI for tighter blood sugar control and to promote wound healing. Continue tradjenta, Increase amaryl to home dose of 4mg  and D/Clantus 6. Iron deficiency anemia (09/18/2011) POA: Unknown Assessment:Hgb stable post transfusion vs today  Check stool guaiacs. Add iron supplement. H and H improved. Plan: Check stool guaiacs. Add iron supplement.  7. Gout (09/18/2011) POA: Yes Assessment: no symptoms currently  Plan: Continue allopurinol daily.  8. HTN (hypertension) (09/18/2011) POA: Yes Assessment: borderline. Observe with activity. Pain also affecting. Same meds for now Plan: Monitor with BID checks. Continue hydralazine and metoprolol.  9. Cardiomyopathy, ischemic (09/20/2011) POA: Yes Assessment:  Plan: Monitor for fluid overload. Continue hydralazine and check daily weights. On lopressor but no ace noted likely due to renal insufficiency.  10. Periph vascular disorder (09/18/2011) POA: Yes  Assessment:  Plan: Pressure relief measures and monitor for breakdown.  11. Chronic Kidney Disease: POA: Yes  Assessment: BUN/Creatinine @47 /1.72 at admission.  Labs yesterday show gradual improvement.  Plan: Monitor with routine checks. Avoid nephrotoxic med's.  12. Insomnia: Scheduled trazodone effective.  Continue to monitor.

## 2011-10-03 NOTE — Progress Notes (Signed)
Recreational Therapy Assessment and Plan  Patient Details  Name: Todd Cook MRN: [id number] Date of Birth: 1941-02-05  Rehab Potential: Good ELOS: 7-10 days   Assessment Clinical Impression: Patient is a 70 y.o. year old male was originally admitted 09/18/11 with chest pain due to NSTEMI. Underwent PTCA 09/20/11. Underwent aortogram with severe PVD and L BKA 09/26/11. PCP had been treating patient pta ulcers left foot. Felt not to be a candidate for surgical bypass therefore BKA. Patient transferred to CIR on 09/29/2011 . Patient's past medical history is significant for Angina, Chronic Kidney Disease, DM, Hypertension, CAD, MI.  Patient presents with decreased activity tolerance, decreased functional mobility, decreased balance limiting pt's independence with leisure/community pursuits.    Recreational Therapy Leisure History/Participation Premorbid leisure interest/current participation: [name] - Vegetable gardening;[name];Community - Grocery store;Community - Other (Comment) (farming, raises cattle, hay, has horses) Spiritual Interests: Church;Womens'Men's Groups Other Leisure Interests: Television Leisure Participation Style: Alone;With Family/Friends Awareness of Community Resources: Good-identify 3 post discharge leisure resources [name] Appropriate for Education?: Yes Social interaction - Mood/Behavior: Cooperative Recreational Therapy Orientation Orientation -Reviewed with patient: Available activity resources Strengths/Weaknesses Patient Strengths/Abilities: Willingness to participate;Active premorbidly Patient weaknesses: Physical limitations  Plan Rec Therapy Plan Is patient appropriate for Therapeutic Recreation?: Yes Rehab Potential: Good Treatment times per week: min 1 time per week for >20 minutes Estimated Length of Stay: 7-10 days Therapy Goals Achieved By:: Recreation/leisure participation;Community  reintegration/education;1:1 session;Adaptive equipment instruction;Patient/family education  Discharge Criteria: Patient will be discharged from TR if patient refuses treatment 3 consecutive times without medical reason.  If treatment goals not met, if there is a change in medical status, if patient makes no progress towards goals or if patient is discharged from hospital.  The above assessment, treatment plan, treatment alternatives and goals were discussed and mutually agreed upon: by patient  No c/o pain during eval  Todd Cook 10/03/2011, 10:47 AM

## 2011-10-03 NOTE — Progress Notes (Signed)
Occupational Therapy Session Note  Patient Details  Name: Todd Cook MRN: [id number] Date of Birth: 1941-08-16  Today's Date: 10/03/2011 Time: 1100-1140 Time Calculation (min): 40 min  Precautions: Precautions Precautions: Fall Precaution Comments: Fall Required Braces or Orthoses: No Restrictions Weight Bearing Restrictions: Yes LLE Weight Bearing: Non weight bearing  Short Term Goals:  STG = LTGs due to length of stay  Skilled Therapeutic Interventions/Progress Updates:    Pt seen for ADL retraining at sink level.  Pt reports fatigue this session, requesting multiple rest breaks.  Pt demonstrated increased safety awareness with sit to stand and stand to sit from w/c using arms to push up from w/c into standing position.  Pt completed grooming in seated position due to fatigue and was close supervision for standing balance with washing periarea and pulling up underwear and pants.  Pt obtained clothing and items with self-propulsion of w/c.  Pt requested to rest after bathing, dressing, and grooming completed.    Pain Pain Assessment Pain Assessment: No/denies pain Pain Score: 0-No pain  Therapy/Group: Individual Therapy  Leonette Monarch 10/03/2011, 11:46 AM

## 2011-10-03 NOTE — Progress Notes (Signed)
Patient tolerating therapies . Left bka  With ace wrap intact . Mod assist squat pivot. lbm 11-15 reviewed laxative of choice with patient .  Patient able to set up self for meals. Pain managed with percocet 1 prn every 4 hrs. No unsafe behaviors noted . Continue with plan of care.  Todd Cook

## 2011-10-03 NOTE — Progress Notes (Signed)
Physical Therapy Session Note  Patient Details  Name: GENIE MIRABAL MRN: [id number] Date of Birth: 28-Jun-1941  Today's Date: 10/03/2011 Time: 4098-1191 Time Calculation (min): 29 min  Precautions: Precautions Precautions: Fall Precaution Comments: Fall Required Braces or Orthoses: No Restrictions Weight Bearing Restrictions: Yes LLE Weight Bearing: Non weight bearing  Short Term Goals: PT Short Term Goal 1: Pt will perform at Supervision level of assistance for all mobility, transfers, balance, safety and gait by time of d/c PT Short Term Goal 2: Pt will gait x 59ft using RW and supervision in controlled environment PT Short Term Goal 3: Pt will propel manual w/c x 191ft with supervision in controlled environment Pain Pain Assessment Pain Assessment: 0-10 Pain Score:   6 Pain Type: Acute pain Pain Location: Other (Comment) (limb) Pain Orientation: Left Pain Descriptors: Other (Comment) (stretching pain) Pain Onset: With Activity Patients Stated Pain Goal: 0 Pain Intervention(s): RN made aware  Mobility Bed Mobility Supine to Sit: 5: Supervision Sit to Supine - Left: 5: Supervision Transfers Transfers: Yes Squat Pivot Transfers: 3: Mod assist Squat Pivot Transfer Details: Tactile cues for sequencing;Tactile cues for placement;Visual cues for safe use of DME/AE;Visual cues/gestures for precautions/safety;Visual cues/gestures for sequencing;Verbal cues for sequencing;Verbal cues for technique;Verbal cues for precautions/safety;Verbal cues for safe use of DME/AE Squat Pivot Transfer Details (indicate cue type and reason): Educated patient on importance of transferring to both L and R sides to allow for efficient and safe transfers to/from bed.  Educated patient on w/c parts management to transfer to L side; performed transfers bed <> w/c <> mat to R and L sides with mod A to lift buttocks to clear w/c or mat and cues for safe hand placement Locomotion  Wheelchair  Mobility Wheelchair Mobility: Yes Wheelchair Assistance: 5: Supervision Wheelchair Propulsion: Both upper extremities Wheelchair Parts Management: Needs assistance Distance: 150; Educated on w/c parts management for set up for safe squat pivot transfers  Exercises Amputee Exercises Quad Sets: AROM;Strengthening;Left;10 reps;Supine Gluteal Sets: AROM;Strengthening;Left;10 reps;Supine Hip ABduction/ADduction: AROM;Strengthening;Left;10 reps;Supine Knee Flexion: AROM;Strengthening;Left;10 reps;Supine Knee Extension: AROM;Strengthening;Left;10 reps;Supine  Therapy/Group: Individual Therapy  Edman Circle [facility] 10/03/2011, 5:24 PM

## 2011-10-03 NOTE — Progress Notes (Signed)
Occupational Therapy Session Note  Patient Details  Name: CHALMER ZHENG MRN: [id number] Date of Birth: [birth year]  Today's Date: 10/03/2011 Time: 7829-5621 Time Calculation (min): 45 min  Precautions: Precautions Precautions: Fall Precaution Comments: Fall Required Braces or Orthoses: No Restrictions Weight Bearing Restrictions: Yes LLE Weight Bearing: Non weight bearing  Short Term Goals:  STG = LTGs due to length of stay  Skilled Therapeutic Interventions/Progress Updates:    Engaged in therapeutic activities to increase BUE strength to assist with transfers and compensate for loss of limb.  Theraband exercises in sitting with 3 reps of 10 to increase biceps and triceps.  Sit to stand, stand to sit, and standing balance with support of one UE with therapeutic activity to carry over to increase standing balance with grooming at sink.  Pt required multiple rest breaks, but demonstrated increased standing balance with UE support this session.  Pain Pain Assessment Pain Assessment: No/denies pain  Therapy/Group: Individual Therapy  Leonette Monarch 10/03/2011, 3:17 PM

## 2011-10-04 LAB — GLUCOSE, CAPILLARY
Glucose-Capillary: 245 mg/dL — ABNORMAL HIGH (ref 70–99)
Glucose-Capillary: 150 mg/dL — ABNORMAL HIGH (ref 70–99)

## 2011-10-04 MED ORDER — SALINE SPRAY 0.65 % NA SOLN
1.0000 | NASAL | Status: DC | PRN
  Administered 2011-10-04: 1 via NASAL
  Filled 2011-10-04: qty 44

## 2011-10-04 MED ORDER — ALPRAZOLAM 0.25 MG PO TABS: 0.2500 mg | ORAL_TABLET | Freq: Two times a day (BID) | ORAL | Status: AC | PRN

## 2011-10-04 MED ORDER — GLIMEPIRIDE 2 MG PO TABS: 2.0000 mg | ORAL_TABLET | Freq: Two times a day (BID) | ORAL | Status: DC

## 2011-10-04 MED ORDER — METHOCARBAMOL 500 MG PO TABS: 500.0000 mg | ORAL_TABLET | Freq: Four times a day (QID) | ORAL | Status: AC | PRN

## 2011-10-04 MED ORDER — METOPROLOL TARTRATE 50 MG PO TABS: 50.0000 mg | ORAL_TABLET | Freq: Two times a day (BID) | ORAL | Status: DC

## 2011-10-04 MED ORDER — LINAGLIPTIN 5 MG PO TABS: 5.0000 mg | ORAL_TABLET | Freq: Every day | ORAL | Status: DC

## 2011-10-04 MED ORDER — OXYCODONE-ACETAMINOPHEN 5-325 MG PO TABS: 1.0000 | ORAL_TABLET | ORAL | Status: AC | PRN

## 2011-10-04 MED ORDER — CLOPIDOGREL BISULFATE 75 MG PO TABS: 75.0000 mg | ORAL_TABLET | Freq: Every day | ORAL | Status: AC

## 2011-10-04 NOTE — Progress Notes (Signed)
Physical Therapy Session Note  Patient Details  Name: Todd Cook MRN: [id number] Date of Birth: December 25, 1940  Today's Date: 10/04/2011 Time:  0749- 0850 Calculated Time: 61 minutes  Precautions: Precautions Precautions: Fall Precaution Comments: fall Required Braces or Orthoses: No Restrictions Weight Bearing Restrictions: Yes LLE Weight Bearing: Non weight bearing  Short Term Goals: PT Short Term Goal 1: Pt will perform at Supervision level of assistance for all mobility, transfers, balance, safety and gait by time of d/c PT Short Term Goal 2: Pt will gait x 19ft using RW and supervision in controlled environment PT Short Term Goal 3: Pt will propel manual w/c x 178ft with supervision in controlled environment  General Chart Reviewed: Yes Pain Pain Assessment Pain Assessment: 0-10 Pain Score:   2 (amputation site; "not too bad") Discussed sensitization training for pain at wound site, focusing on strategies to help patient reduce pain. Mobility Bed Mobility Bed Mobility: Yes Supine to Sit: 5: Supervision Sitting - Scoot to Edge of Bed: 6: Modified Independent Transfers Transfers: Yes Sit to Stand: 5: Supervision Stand to Sit: 5: Supervision Squat Pivot Transfers: 5: Supervision Squat Pivot Transfer Details: Verbal cues for precautions/safety;Verbal cues for sequencing;Verbal cues for technique;Verbal cues for safe use of DME/AE Squat Pivot Transfer Details (indicate cue type and reason): Squat pivot transfer practice w/c<>plinth table 2x each direction supervision,  focusing on w/c parts management, sequence and safety to increase functional mobility and decrease the risk of falling during transfers.  Pt supervision, but continually requires mod-max verbal cues for w/c parts management, sequence and safety due to decreased safety awareness and poor carry-over from previous sessions. Pt's SpO2 levels dropped consistently to 86% after each transfer, requiring verbal cues  for pursed lip breathing in order to recover to > 90% on room air before attempting next transfer (recovers consistently within 1-2 min with pursed lip breathing technique, however required mod verbal cues to initiate and maintain breathing techniques during mobility). RN and MD aware, O2 to be ordered to maintain O2 sat > 90% during mobility. Will continue to monitor. Locomotion  Ambulation Ambulation: Yes Ambulation/Gait Assistance: 4: Min assist Ambulation Distance (Feet): 34 Feet Assistive device: Rolling walker Ambulation/Gait Assistance Details: Gait ~ 28ft min A with RW, focusing on safety, proper use of RW and technique to increase functional mobility and decrease falls during ambulation.  Pt better able to self-correct RW technique this morning and did not need vc's to take smaller steps and keep walker close by.  Pt's SpO2 dropped to 84% at end of gait training, requiring verbal cues for pursed lip breathing in order to recover to > 90% on room air. Stairs / Additional Locomotion Stairs: No Corporate treasurer: Yes Wheelchair Assistance: 5: Supervision W/c mobility supervision ~143ft with assistance and cues for parts management; rest break required due to fatigue at 174ft.  Therapy/Group: Individual Therapy  Christianne Dolin 10/04/2011, 9:22 AM

## 2011-10-04 NOTE — Progress Notes (Addendum)
Pt transfers min assist squat pivot to right side. Left BKA incision clean dry intact with staples. Dry dressing clean dry intact with kerlex and Ace wrap to be changed after therapies. Pt continent of bowel and bladder. Small BM in BR toilet this am per report. Patient alert and oriented with some memory deficits noted. No unsafe behaviors today. No complaints of pain today. Patient had two nosebleeds this am after forcefully blowing nose per patient report. Small amount of blood noted with each. Patient instructed on bleeding precautions due to being on Lovenox. Continue with plan of care.  Patient had additional nosebleed, saline nasal spray ordered for congestion. Kerlex and Ace dressing changed this afternoon, incision clean dry intact with no drainage, minimal swelling. Pt refuses suppository or other laxative at this time due to small BM this am. Continue with plan of care.

## 2011-10-04 NOTE — Progress Notes (Signed)
Patient ID: Todd Cook, male   DOB: 1941-05-04, 70 y.o.   MRN: [id number] Clinical update faxed to [facility] including Progress Notes and team Conferencce notes.

## 2011-10-04 NOTE — Progress Notes (Signed)
Physical Therapy Session Note  Patient Details  Name: Todd Cook MRN: [id number] Date of Birth: 1941/01/09  Today's Date: 10/04/2011 Time: 0932-1032 Time Calculation (min): 60 min  Precautions: Precautions Precautions: Fall Precaution Comments: fall Required Braces or Orthoses: No Restrictions Weight Bearing Restrictions: Yes LLE Weight Bearing: Non weight bearing  Short Term Goals: PT Short Term Goal 1: Pt will perform at Supervision level of assistance for all mobility, transfers, balance, safety and gait by time of d/c PT Short Term Goal 2: Pt will gait x 9ft using RW and supervision in controlled environment PT Short Term Goal 3: Pt will propel manual w/c x 173ft with supervision in controlled environment  General Chart Reviewed: Yes Pain Pain Assessment Pain Assessment: 0-10 Pain Score:   2 Mobility Bed Mobility Bed Mobility: Yes Rolling Right: 7: Independent Right Sidelying to Sit: 6: Modified Independent Supine to Sit: 5: Supervision Sitting - Scoot to Edge of Bed: 6: Modified Independent Sit to Supine - Left: 5: Set up Transfers Transfers: Yes Sit to Stand: 5: Supervision;From toilet;With upper extremity assist (with RW) Sit to Stand Details: Verbal cues for technique Stand to Sit: With upper extremity assist;To toilet;5: Supervision;Other (comment) (with RW) Stand to Sit Details (indicate cue type and reason): Verbal cues for technique Squat Pivot Transfers: 5: Supervision;Other (comment) (vc's for w/c parts management) Squat Pivot Transfer Details: Verbal cues for sequencing;Verbal cues for technique;Verbal cues for precautions/safety;Verbal cues for safe use of DME/AE Squat Pivot Transfer Details (indicate cue type and reason): mod-max verbal cues for sequencing, technique, safety and problem solving; decreased safety awareness Toilet transfer using RW min A with unlevel surface negotiation, focusing on sequence, safety and technique.  Pt requires vc  to prevent walker from getting too far away during surface negotiation.   Equities trader: Yes Wheelchair Assistance: 5: Supervision > 150 Buyer, retail: Both upper extremities Wheelchair Parts Management: Supervision/cueing   Exercises Exercises on plinth table for increased L LE strength and AROM:  Supine L quad sets (2 x 10), L hip flexion (2 x 10) and bridging with bilat knees on bolster (2 x 10) Right sidelying L hip abduction (2 x 10) and L LE arc of hip flexion combined with hip extension with tc/vc for trunk stabilization Pt's SpO2 maintained >90% on room air during all exercises on plinth table  W/c push-ups using arm rests and foot on floor (2 x 5), focusing on increasing UE strength and AROM to increase functional mobility and decrease risk of falling during mobility Isometric squat hold (2x) from standing with slow squat>sit after isometric hold (~10sec) min A using RW in front of w/c, focusing on increasing functional LE strength to increase functional mobility.  Frequent rest breaks required between push-ups and isometric squats.  Wife present during session. Initiated education re: long term goals at supervision level with use of W/C and RW household distances, positioning of left LE, home safety and set-up, pursed lip breathing during mobility to maintain O2 saturation > 90%, and bilat LE therex. Wife verbalized understanding, ongoing at this time. Also discussed ramp for one step to enter home for patient safety. Per wife, sons are building the ramp today for safe home entry. Stair goal discontinued at this time.  Therapy/Group: Individual Therapy  Christianne Dolin 10/04/2011, 12:14 PM

## 2011-10-04 NOTE — Progress Notes (Signed)
Occupational Therapy Session Note  Patient Details  Name: Todd Cook MRN: [id number] Date of Birth: January 26, 1941  Today's Date: 10/04/2011 Time: 0865-7846 Time Calculation (min): 45 min  Precautions: Precautions Precautions: Fall Precaution Comments: fall Required Braces or Orthoses: No Restrictions Weight Bearing Restrictions: Yes LLE Weight Bearing: Non weight bearing  Short Term Goals:  STG = LTGs  Skilled Therapeutic Interventions/Progress Updates:    Engaged in ADL retraining focused on transfer to/from tubbench in ADL tubroom with squatpivot from w/c and ambulating with RW.  Pt demonstrated proper w/c positioning, locking brakes, and removing leg rests with min verbal cues.  Transfer from w/c and with RW with supervision and min cues for hand placement with sit to stand and stand to sit.  Wife educated on safe transfers and adapting tub/shower at home.  Engaged in community mobility in gift shop with pt navigating w/c with self-propulsion through a busy environment without requiring physical assistance.  Pain Pain Assessment Pain Assessment: 0-10 Pain Score:   2  Therapy/Group: Individual Therapy  Leonette Monarch 10/04/2011, 2:27 PM

## 2011-10-04 NOTE — Progress Notes (Signed)
Occupational Therapy Session Note  Patient Details  Name: Todd Cook MRN: [id number] Date of Birth: 04-15-1941  Today's Date: 10/04/2011 Time: 1120-1200 Time Calculation (min): 40 min  Precautions: Precautions Precautions: Fall Precaution Comments: fall Required Braces or Orthoses: No Restrictions Weight Bearing Restrictions: Yes LLE Weight Bearing: Non weight bearing  Short Term Goals:  STG = LTGs due to length of stay  Skilled Therapeutic Interventions/Progress Updates:    Pt seen for ADL retraining to include grooming, bathing and dressing.  Focused session on Endurance and Safety with bathing and dressing at sit to stand level.  Pt required min verbal cues to lock brakes prior to donning sock and shoe and to move leg rests out of the way prior to standing.  Pt demonstrated increased awareness of locking and unlocking brakes throughout session.  Ambulated with RW to toilet to practice safe transfer to regular commode with use of grab bar on Rt side, supervision for safety and proper technique.   Pain Pain Assessment Pain Assessment: 0-10 Pain Score:   2  Therapy/Group: Individual Therapy  Leonette Monarch 10/04/2011, 12:09 PM

## 2011-10-04 NOTE — Progress Notes (Signed)
Therapeutic Recreation Discharge Summary Patient Details  Name: Todd Cook MRN: [id number] Date of Birth: 10-Dec-1940  Long term goals set: 2  Long term goals met: 2  Comments on progress toward goals: Pt has made great progress toward goals meeting 2/2.  Pt overall is Mod I for seated TR tasks given extra time and is supervision for community pursuits w/c level.  Pt's wife present and observing therapy.  Pt ready for d/c home on 10/07/11 with wife.  Reasons goals not met: n/a  Reasons for discharge: discharge from hospital Patient/family agrees with progress made and goals achieved: Yes  [name] 10/04/2011, 4:53 PM

## 2011-10-04 NOTE — Progress Notes (Signed)
Patient Details  Name: Todd Cook MRN: [id number] Date of Birth: [birth year]  Today's Date: 10/04/2011 Time: 14:00-14:30  Skilled Therapeutic Interventions/Progress Updates: Pt participated in community reintegration task w/c level negotiating obstacles and propelling w/c with supervision.  Pt's wife present and observing therapy.  No c/o pain, only c/o fatigue.  Pt Mod I for seated TR tasks w/c level given extra time.  Pt set for d/c home 10/07/11.  Therapy/Group: individual  Activity Level: Moderate:  Level of assist: Supervision  Zaray Gatchel 10/04/2011, 4:50 PM

## 2011-10-04 NOTE — Progress Notes (Signed)
Patient ID: Todd Cook, male   DOB: 09-24-1941, 70 y.o.   MRN: [id number]   Subjective; Busy day yesterday.  Pain better overall  Todd Cook is an 70 y.o. Male. With history of CKD, DM, PVD with chronic left great toe ulcer; admitted 09/18/11 with chest pain due to NSTEMI. Pt underwent PTCA with stent placement on 09/20/11 by Dr. Sanjuana Kava. Evaluated by Dr Darrick Penna and as aortogram with evidence of severe PVD, surgery recommended. 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 Post op with ABLA with Hgb down to 6.7 requiring transfusion. Blood culture X 2 and one positive for strep viridans. ID feels likely contaminated and monitor for now.     Review of Systems  Constitutional: Negative for fever and chills.  HENT: Positive for hearing loss.  Eyes: Negative.  Respiratory: Negative. Negative for cough.  Cardiovascular: Negative.  Musculoskeletal: Positive for joint pain. Mainly left phantom pain which is improving Skin: Negative.  Psychiatric/Behavioral: The patient has insomnia.  right leg painful but showing improvement    Functional History:  Prior Function  Level of Independence: Independent with basic ADLs;Independent with homemaking with ambulation;Independent with homemaking with wheelchair;Independent with gait  Able to Take Stairs?: Yes  Driving: Yes  Vocation: Retired  Functional Status:  Mobility:  Bed Mobility  Bed Mobility: Yes  Supine to Sit: 4: Min assist;HOB flat;Other (comment) (Min guard A for bridging to EOB after VC's)  Supine to Sit Details (indicate cue type and reason): struggle to get up onto elbows then powered up well  Transfers  Transfers: Yes  Sit to Stand: 4: Min assist;From bed;1: +1 Total assist  Sit to Stand Details (indicate cue type and reason): vc's for hand placement;visual cues for technique; manual A for forward w/shift  Stand to Sit: 4: Min assist;To chair/3-in-1  Stand to Sit Details: vc for hand placement  Squat Pivot Transfers: 1: +2  Total assist;Patient percentage (comment);From elevated surface;With armrests (pt 80%)  Squat Pivot Transfer Details (indicate cue type and reason): pt felt much more confortable with squat-pivot transfer than full stand. Educated pt on chair set-up to right for ease of transfers  Ambulation/Gait  Ambulation/Gait: Yes  Ambulation/Gait Assistance: 3: Mod assist;Patient percentage (comment) (pt =70%; with RW)  Ambulation/Gait Assistance Details (indicate cue type and reason): vc/visual cues for "swing to " technique; manual A to maneuver RW  Ambulation Distance (Feet): 10 Feet  Assistive device: Rolling walker  Gait Pattern: (swing to gait pattern)  Stairs: No  Wheelchair Mobility  Wheelchair Mobility: No  ADL:  ADL  Eating/Feeding: Simulated;Set up  Where Assessed - Eating/Feeding: Edge of bed  Grooming: Performed  Where Assessed - Grooming: Sitting, bed  Upper Body Bathing: Simulated;Chest;Right arm;Left arm;Abdomen;Set up  Where Assessed - Upper Body Bathing: Sitting, bed  Lower Body Bathing: Simulated;Maximal assistance  Where Assessed - Lower Body Bathing: Sit to stand from bed  Upper Body Dressing: Performed;Set up  Upper Body Dressing Details (indicate cue type and reason): with donning gown  Where Assessed - Upper Body Dressing: Sitting, bed  Lower Body Dressing: Simulated;Maximal assistance  Where Assessed - Lower Body Dressing: Sit to stand from bed  Toilet Transfer: Not assessed  Toilet Transfer Method: Not assessed  Toileting - Clothing Manipulation: Not assessed  Where Assessed - Toileting Clothing Manipulation: Not assessed  Toileting - Hygiene: Not assessed  Where Assessed - Toileting Hygiene: Not assessed  Tub/Shower Transfer: Not assessed  Tub/Shower Transfer Method: Not assessed  Equipment Used: Rolling walker  Cognition:  Cognition  Arousal/Alertness: Awake/alert  Orientation Level: Oriented X4  Cognition  Arousal/Alertness: Awake/alert  Overall Cognitive  Status: Appears within functional limits for tasks assessed  Orientation Level: Oriented X4  BP 129/71  Pulse 69  Temp(Src) 98.8 F (37.1 C) (Oral)  Resp 17  Ht 5\' 10"  (1.778 m)  Wt 76.7 kg (169 lb 1.5 oz)  BMI 24.26 kg/m2  SpO2 99%    Physical Exam  Constitutional: He appears well-developed.  HENT:  Mouth/Throat: Abnormal dentition.  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  Moderate edema, no drainage.  A little hypersensitive to touch.  Resolving ecchymosis.  RLE intact, no edema. Neurological: A sensory deficit is present.  Reflex Scores:  Tricep reflexes are 1+ on the right side and 1+ on the left side.  Bicep reflexes are 1+ on the right side and 1+ on the left side.  Brachioradialis reflexes are 1+ on the right side and 1+ on the left side.  Patellar reflexes are 1+ on the right side.  Achilles reflexes are 1+ on the right side. Alert and oriented x3 with cues.. Stocking glove sensory loss to mid calf RLE, Some tingling in both hands. Strength in upper extremities 4-5/5. RLE grossly 4-5/5 with some slight diminishment distally. Left leg 2/5 at hip. I didn't test knee.  L stump bulbous R foot without lesions Patient also is hard of hearing. Still slightly disoriented.  Marland Kitchen  Post Admission Physician Evaluation:  1. Functional deficits secondary to Left BKA 2. Patient admitted to receive collaborative, interdisciplinary care between the physiatrist, rehab nursing staff, and therapy team. 3. Patient's level of medical complexity and substantial therapy needs in context of that medical necessity cannot be provided at a lesser intensity of care. 4. Patient has experienced substantial functional loss from his/her baseline. Please see above functional evals for premorbid and current levels. Judging by the patient's diagnosis, physical exam, and functional history, the patient has potential for functional progress which will result in measurable gains while on inpatient  rehab. These gains will be of substantial and practical use upon discharge in facilitating mobility and self-care at the household level. 5. Physiatrist will provide 24 hour management of medical needs as well as oversight of the therapy plan/treatment and provide guidance as appropriate regarding the interaction of the two. 6. 24 hour rehab nursing will assist in the management of bladder management, bowel management, safety, skin/wound care, pain management and patient education and help integrate therapy concepts, techniques,education, etc. 7. PT will assess and treat for: balance, locomotion, strength and transferring. Goals are: independent with assistive device/ to perhaps supervision. OT will assess and treat for: bathing, bowel/bladder control, dressing, psychosocial adjustment, toileting and transferring . Goals are: independent with assistive device to supervision  8. SLP will potentially assess and treat for: cognition and awareness. Goals are: independent. (Will allow OT and PT observe for safety needs for now before making a formal consult) 9. Case Management and Social Worker will assess and treat for psychological issues and discharge planning. 10. Team conference will be held weekly to assess progress toward goals and to determine barriers to discharge. 11. Patient will receive at least 3 hours of therapy per day at least 5 days per week. 12. ELOS and Prognosis: 7-10 days excellent Medical Problem List and Plan:  1. DVT Prophylaxis/Anticoagulation: Pharmaceutical: Lovenox  2. Pain Management: Pain limiting activity. Will add long acting pain medication to help with consistent pain relief...need to carefully watch cognition however. Patient is already massaging leg,  and I encouraged further sensory feedback to reduce pain. Discussed w/pt and his wife 3. Mood: Monitor. Remains motivated.  Patient Active Hospital Problem List: 4. Non-STEMI (non-ST elevated myocardial infarction)  (09/18/2011) POA: Yes Assessment:  Plan Continue Aspirin, plavix and crestor 5. DM (diabetes mellitus) type I uncontrolled  Assessment: Blood sugars variable currently.  , Plan: Monitor with AC/HS cbg checks. Use SSI for tighter blood sugar control and to promote wound healing. Continue tradjenta, Increase amaryl to home dose of 4mg  and D/Clantus 6. Iron deficiency anemia (09/18/2011) POA: Unknown Assessment:Hgb stable post transfusion vs today  Check stool guaiacs. Add iron supplement. H and H improved. Plan: Check stool guaiacs. Add iron supplement.  7. Gout (09/18/2011) POA: Yes Assessment: no symptoms currently  Plan: Continue allopurinol daily.  8. HTN (hypertension) (09/18/2011) POA: Yes Assessment: borderline. Observe with activity. Pain also affecting. Same meds for now Plan: Monitor with BID checks. Continue hydralazine and metoprolol.  9. Cardiomyopathy, ischemic (09/20/2011) POA: Yes Assessment:  Plan: Monitor for fluid overload. Continue hydralazine and check daily weights. On lopressor but no ace noted likely due to renal insufficiency.  10. Periph vascular disorder (09/18/2011) POA: Yes  Assessment:  Plan: Pressure relief measures and monitor for breakdown.  11. Chronic Kidney Disease: POA: Yes  Assessment: BUN/Creatinine @47 /1.72 at admission.  Labs yesterday show gradual improvement.  Plan: Monitor with routine checks. Avoid nephrotoxic med's.  12. Insomnia: Scheduled trazodone effective.  Continue to monitor.

## 2011-10-04 NOTE — Progress Notes (Signed)
Social Work Patient ID: Todd Cook, male   DOB: Dec 10, 1940, 70 y.o.   MRN: [id number]  Met with pt and wife to discuss team conference and goals. Pt's goals are supervision to mod/I level. Wife here to attend family education and learn pt's care.  Also plans to come Friday per request of PT to  Learn stump wrapping and care.  Discussed home health and dme needs. Both agreeable and have no pref Pt is pleased with the progress he has made and reports pain is much better.  Will make referral to [facility] For wheelchair, rolling walker, 3in 1 and follow up PT, OT, and RN. Both agreeable to plan, discharge date Sat, 11/24.

## 2011-10-04 NOTE — Patient Care Conference (Signed)
Inpatient RehabilitationTeam Conference Note Date: 10/04/2011   Time: 11:15    Patient Name: Todd Cook      Medical Record Number: [id number]  Date of Birth: [birth year] Sex: Male         Room/Bed: 4037/4037-01 Payor Info: Payor: [employer]ICARE  Plan: [employer] MEDICARE HMO  Product Type: *No Product type*     Admitting Diagnosis: L BKA  Admit Date/Time:  09/29/2011  6:31 PM Admission Comments: No comment available   Primary Diagnosis:  S/P BKA (below knee amputation) Principal Problem: S/P BKA (below knee amputation)  Patient Active Problem List  Diagnoses Date Noted  . S/P BKA (below knee amputation) 10/02/2011  . PVD (peripheral vascular disease) 09/29/2011  . Cardiomyopathy, ischemic 09/20/2011  . Non-STEMI (non-ST elevated myocardial infarction) 09/18/2011  . 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 Osteomyelitis 09/18/2011  . DM (diabetes mellitus) type I uncontrolled, periph vascular disorder 09/18/2011  . Diverticulosis of colon 09/18/2011  . S/P carotid endarterectomy 09/18/2011  . Iron deficiency anemia 09/18/2011  . Gout attack 09/18/2011  . HTN (hypertension) 09/18/2011  . Nephrolithiasis 09/18/2011    Expected Discharge Date: Expected Discharge Date: 10/07/11  Team Members Present: Physician: Dr. Claudette Laws Case Manager Present: Lutricia Horsfall, RN Social Worker Present: Dossie Der, LCSW PT Present: Illene Bolus, PT OT Present: Leonette Monarch, OT;Hilary Ward, OT RN Present: Laural Roes, RN    Current Status/Progress Goal Weekly Team Focus  Medical   Easily confused, pain doing ok, stump swelling, constipation  improve stump edema  cont ACE srap, may be ready for shrinker   Bowel/Bladder   continent lbm 11-15 constipation  bm every 48hrs   monitor effectiveness of prn meds    Swallow/Nutrition/ Hydration             ADL's   setup UB dsg, setup - min A for steadying to complete LB dsg, bathing supervision, transfers min A approaching supervision   supervision overall  standing balance and endurance in standing, activity tolerance, family ed   Mobility   supervision w/c level, min assist gait with RW x 20 feet  mod I w/c level, supervision gait with RW x 50 feet  ramp step for home entry, family education   Communication             Safety/Cognition/ Behavioral Observations            Pain   percocet 1-2 po prn left leg pain from bka  keep pain level less than 3  monitor effeciveness of pain meds   Skin   left bka incision staples W.N.L.  no new breakdown   educate on s/s infection      *See Interdisciplinary Assessment and Plan and progress notes for long and short-term goals  Barriers to Discharge: pt unable to remember ace wrap technique    Possible Resolutions to Barriers:       Discharge Planning/Teaching Needs:  Home with wife providing assistance, she has been here observing him in therapies. Deconditioned needs to get stronger.      Team Discussion:  Stump swelling. Poor memory/carry-over. Needs cueing for safety.Constipation, needs suppository. Wife to come Friday for education in preparation for d/c 10/07/11.   Revisions to Treatment Plan:     Continued Need for Acute Rehabilitation Level of Care: The patient requires daily medical management by a physician with specialized training in physical medicine and rehabilitation for the following conditions: Daily direction of a multidisciplinary physical rehabilitation program to ensure safe treatment  while eliciting the highest outcome that is of practical value to the patient.: Yes Daily medical management of patient stability for increased activity during participation in an intensive rehabilitation regime.: Yes Daily analysis of laboratory values and/or radiology reports with any subsequent need for medication adjustment of medical intervention for : Post surgical problems  Case Manager reviewed Team Conference with pt. Pt in agreement with goals and d/c date of  10/07/11. SW called pt's wife to inform her of d/c date ane requested that she come Friday for ed. Meryl Dare 10/04/2011, 2:27 PM

## 2011-10-05 LAB — GLUCOSE, CAPILLARY
Glucose-Capillary: 215 mg/dL — ABNORMAL HIGH (ref 70–99)
Glucose-Capillary: 179 mg/dL — ABNORMAL HIGH (ref 70–99)
Glucose-Capillary: 223 mg/dL — ABNORMAL HIGH (ref 70–99)

## 2011-10-05 NOTE — Progress Notes (Signed)
Patient ID: Todd Cook, male   DOB: Oct 06, 1941, 70 y.o.   MRN: [id number] Patient ID: Todd Cook, male   DOB: 10/07/1941, 70 y.o.   MRN: [id number]   Subjective; Busy day yesterday.  Pain better overall  Todd Cook is an 70 y.o. Male. With history of CKD, DM, PVD with chronic left great toe ulcer; admitted 09/18/11 with chest pain due to NSTEMI. Pt underwent PTCA with stent placement on 09/20/11 by Dr. Sanjuana Kava. Evaluated by Dr Darrick Penna and as aortogram with evidence of severe PVD, surgery recommended. 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 Post op with ABLA with Hgb down to 6.7 requiring transfusion. Blood culture X 2 and one positive for strep viridans. ID feels likely contaminated and monitor for now.     Review of Systems  Constitutional: Negative for fever and chills.  HENT: Positive for hearing loss.  Eyes: Negative.  Respiratory: Negative. Negative for cough.  Cardiovascular: Negative.  Musculoskeletal: Positive for joint pain. Mainly left stump pain which is improving Skin: Negative.  Psychiatric/Behavioral: The patient has insomnia.  right leg painful but showing improvement    Functional History:  Prior Function  Level of Independence: Independent with basic ADLs;Independent with homemaking with ambulation;Independent with homemaking with wheelchair;Independent with gait  Able to Take Stairs?: Yes  Driving: Yes  Vocation: Retired  Functional Status:  Mobility:  Bed Mobility  Bed Mobility: Yes  Supine to Sit: 4: Min assist;HOB flat;Other (comment) (Min guard A for bridging to EOB after VC's)  Supine to Sit Details (indicate cue type and reason): struggle to get up onto elbows then powered up well  Transfers  Transfers: Yes  Sit to Stand: 4: Min assist;From bed;1: +1 Total assist  Sit to Stand Details (indicate cue type and reason): vc's for hand placement;visual cues for technique; manual A for forward w/shift  Stand to Sit: 4: Min assist;To chair/3-in-1   Stand to Sit Details: vc for hand placement  Squat Pivot Transfers: 1: +2 Total assist;Patient percentage (comment);From elevated surface;With armrests (pt 80%)  Squat Pivot Transfer Details (indicate cue type and reason): pt felt much more confortable with squat-pivot transfer than full stand. Educated pt on chair set-up to right for ease of transfers  Ambulation/Gait  Ambulation/Gait: Yes  Ambulation/Gait Assistance: 3: Mod assist;Patient percentage (comment) (pt =70%; with RW)  Ambulation/Gait Assistance Details (indicate cue type and reason): vc/visual cues for "swing to " technique; manual A to maneuver RW  Ambulation Distance (Feet): 10 Feet  Assistive device: Rolling walker  Gait Pattern: (swing to gait pattern)  Stairs: No  Wheelchair Mobility  Wheelchair Mobility: No  ADL:  ADL  Eating/Feeding: Simulated;Set up  Where Assessed - Eating/Feeding: Edge of bed  Grooming: Performed  Where Assessed - Grooming: Sitting, bed  Upper Body Bathing: Simulated;Chest;Right arm;Left arm;Abdomen;Set up  Where Assessed - Upper Body Bathing: Sitting, bed  Lower Body Bathing: Simulated;Maximal assistance  Where Assessed - Lower Body Bathing: Sit to stand from bed  Upper Body Dressing: Performed;Set up  Upper Body Dressing Details (indicate cue type and reason): with donning gown  Where Assessed - Upper Body Dressing: Sitting, bed  Lower Body Dressing: Simulated;Maximal assistance  Where Assessed - Lower Body Dressing: Sit to stand from bed  Toilet Transfer: Not assessed  Toilet Transfer Method: Not assessed  Toileting - Clothing Manipulation: Not assessed  Where Assessed - Toileting Clothing Manipulation: Not assessed  Toileting - Hygiene: Not assessed  Where Assessed - Toileting Hygiene: Not assessed  Tub/Shower Transfer: Not assessed  Tub/Shower Transfer Method: Not assessed  Equipment Used: Rolling walker  Cognition:  Cognition  Arousal/Alertness: Awake/alert  Orientation Level:  Oriented X4  Cognition  Arousal/Alertness: Awake/alert  Overall Cognitive Status: Appears within functional limits for tasks assessed  Orientation Level: Oriented X4  BP 139/73  Pulse 76  Temp(Src) 97.9 F (36.6 C) (Oral)  Resp 15  Ht 5\' 10"  (1.778 m)  Wt 76.7 kg (169 lb 1.5 oz)  BMI 24.26 kg/m2  SpO2 98%    Physical Exam  Constitutional: He appears well-developed.  HENT:  Mouth/Throat: Abnormal dentition.  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  Moderate edema, no drainage.  A little hypersensitive to touch.  Resolving ecchymosis.  RLE intact, no edema. Neurological: A sensory deficit is present.  Reflex Scores:  Tricep reflexes are 1+ on the right side and 1+ on the left side.  Bicep reflexes are 1+ on the right side and 1+ on the left side.  Brachioradialis reflexes are 1+ on the right side and 1+ on the left side.  Patellar reflexes are 1+ on the right side.  Achilles reflexes are 1+ on the right side. Alert and oriented x3 with cues.. Stocking glove sensory loss to mid calf RLE, Some tingling in both hands. Strength in upper extremities 4-5/5. RLE grossly 4-5/5 with some slight diminishment distally. Left leg 2/5 at hip. I didn't test knee.  L stump bulbous no erythema or drainage R foot without lesions Patient also is hard of hearing. Still slightly disoriented.  Marland Kitchen  Post Admission Physician Evaluation:  1. Functional deficits secondary to Left BKA 2. Patient admitted to receive collaborative, interdisciplinary care between the physiatrist, rehab nursing staff, and therapy team. 3. Patient's level of medical complexity and substantial therapy needs in context of that medical necessity cannot be provided at a lesser intensity of care. 4. Patient has experienced substantial functional loss from his/her baseline. Please see above functional evals for premorbid and current levels. Judging by the patient's diagnosis, physical exam, and functional history, the  patient has potential for functional progress which will result in measurable gains while on inpatient rehab. These gains will be of substantial and practical use upon discharge in facilitating mobility and self-care at the household level. 5. Physiatrist will provide 24 hour management of medical needs as well as oversight of the therapy plan/treatment and provide guidance as appropriate regarding the interaction of the two. 6. 24 hour rehab nursing will assist in the management of bladder management, bowel management, safety, skin/wound care, pain management and patient education and help integrate therapy concepts, techniques,education, etc. 7. PT will assess and treat for: balance, locomotion, strength and transferring. Goals are: independent with assistive device/ to perhaps supervision. OT will assess and treat for: bathing, bowel/bladder control, dressing, psychosocial adjustment, toileting and transferring . Goals are: independent with assistive device to supervision  8. SLP will potentially assess and treat for: cognition and awareness. Goals are: independent. (Will allow OT and PT observe for safety needs for now before making a formal consult) 9. Case Management and Social Worker will assess and treat for psychological issues and discharge planning. 10. Team conference will be held weekly to assess progress toward goals and to determine barriers to discharge. 11. Patient will receive at least 3 hours of therapy per day at least 5 days per week. 12. ELOS and Prognosis: 7-10 days excellent Medical Problem List and Plan:  1. DVT Prophylaxis/Anticoagulation: Pharmaceutical: Lovenox  2. Pain Management: Pain limiting activity. Will add long  acting pain medication to help with consistent pain relief...need to carefully watch cognition however. Patient is already massaging leg, and I encouraged further sensory feedback to reduce pain. Discussed w/pt and his wife 3. Mood: Monitor. Remains motivated.    Patient Active Hospital Problem List: 4. Non-STEMI (non-ST elevated myocardial infarction) (09/18/2011) POA: Yes Assessment:  Plan Continue Aspirin, plavix and crestor 5. DM (diabetes mellitus) type I uncontrolled  Assessment: Blood sugars variable currently.  , Plan: Monitor with AC/HS cbg checks. Use SSI for tighter blood sugar control and to promote wound healing. Continue tradjenta, Increase amaryl to home dose of 4mg  and D/Clantus 6. Iron deficiency anemia (09/18/2011) POA: Unknown Assessment:Hgb stable post transfusion vs today  Check stool guaiacs. Add iron supplement. H and H improved. Plan: Check stool guaiacs. Add iron supplement.  7. Gout (09/18/2011) POA: Yes Assessment: no symptoms currently  Plan: Continue allopurinol daily.  8. HTN (hypertension) (09/18/2011) POA: Yes Assessment: borderline. Observe with activity. Pain also affecting. Same meds for now Plan: Monitor with BID checks. Continue hydralazine and metoprolol.  9. Cardiomyopathy, ischemic (09/20/2011) POA: Yes Assessment:  Plan: Monitor for fluid overload. Continue hydralazine and check daily weights. On lopressor but no ace noted likely due to renal insufficiency.  10. Periph vascular disorder (09/18/2011) POA: Yes  Assessment:  Plan: Pressure relief measures and monitor for breakdown.  11. Chronic Kidney Disease: POA: Yes  Assessment: BUN/Creatinine @47 /1.72 at admission.  Labs yesterday show gradual improvement.  Plan: Monitor with routine checks. Avoid nephrotoxic med's.  12. Insomnia: Scheduled trazodone effective.  Continue to monitor.  May need stump shrinker prior to D/C

## 2011-10-05 NOTE — Progress Notes (Addendum)
Patient alert and oriented x3 today. Family in room. Patient in good spirits. No nosebleeds today. No complaints of pain today. Stump incision clean dry intact with staples, no drainage or signs of infection. Slight edema, improving. New dressing of kerlex and ace applied. Pt voids in urinal. Transfers min assist stand pivot to the right side from bed to wheelchair and wheelchair to bed. Wife assists with setup for meals. Patient and wife educated on wrapping stump and preventing infection. Also educated on repositioning and prevention of pressure ulcers. Continue with plan of care.

## 2011-10-06 LAB — GLUCOSE, CAPILLARY
Glucose-Capillary: 138 mg/dL — ABNORMAL HIGH (ref 70–99)
Glucose-Capillary: 168 mg/dL — ABNORMAL HIGH (ref 70–99)

## 2011-10-06 NOTE — Progress Notes (Signed)
Per State Regulation 482.30 This chart was reviewed for medical necessity with respect to the patient's Admission/Duration of stay. Completing family ed w/ wife for anticipated d/c tomorrow. Goals were downgraded to supervision overall.   Brock Ra                 Nurse Care Manager              Next Review Date: none

## 2011-10-06 NOTE — Progress Notes (Signed)
Occupational Therapy Discharge Summary & Session Note  Patient Details  Name: Todd Cook MRN: [id number] Date of Birth: [birth year] Today's Date: 10/06/2011  Session Note  Time: 1000-1055 Time Calculation: 55 Minutes Individual Therapy No complaints of pain  Skilled therapy focusing on ADL retraining at sink level. Patient declined shower with residual limb covered and declined practicing simulated tub/shower transfer onto tub bench; patient seemed anxious about showering. Sit to stand at sink for ADLs at overall supervision level. Also completed toilet transfer onto [facility] in BR over toilet seat; patient ambulating into BR using rolling walker with supervision. Education/training with return demonstration from wife on residual limb compression wrapping. Wife independent with wrapping of limb.   Discharge Summary  Patient has met 7 of 7 long term goals due to improved activity tolerance, improved balance, postural control, ability to compensate for deficits and improved coordination.  Patient performing at an overall supervision level for aspects of daily living. Patient's care partner is independent to provide the necessary supervision assistance at discharge. Education completed regarding ADLs and compression wrapping for left residual limb.   See FIM and Discharge Navigator for subjective/objective measures.   Recommendation:  No additional occupational therapy recommended at discharge.   Equipment: Equipment provided: Tub Ad[employer] copywriter and [facility]  Patient/family agrees with progress made and goals achieved: Yes  Shaily Librizzi 10/06/2011, 10:21 AM

## 2011-10-06 NOTE — Progress Notes (Signed)
Physical Therapy Session Note  Patient Details  Name: Todd Cook MRN: [id number] Date of Birth: 07-10-1941  Today's Date: 10/06/2011 Time: 4098-1191 Time Calculation (min): 30 min  Precautions: Precautions Precautions: Fall Precaution Comments: fall Required Braces or Orthoses: No Restrictions Weight Bearing Restrictions: Yes LLE Weight Bearing: Non weight bearing  Short Term Goals: PT Short Term Goal 1: Pt will perform at Supervision level of assistance for all mobility, transfers, balance, safety and gait by time of d/c PT Short Term Goal 2: Pt will gait x 38ft using RW and supervision in controlled environment PT Short Term Goal 3: Pt will propel manual w/c x 182ft with supervision in controlled environment  Skilled Therapeutic Interventions/Progress Updates: Focus of treatment: Wife to be instructed in safe bed, car transfers and guarding with ambulation with assistive device     General Pt. In bed      Pain Pt reports minimal (2/10) pain Lt. BKA with reported phantom sensation lower leg    Mobility Pt performs supine to sit independently without bedrails : pt. transfered from bed to w/c with SBA wife (Scoot to squat pivot) : wife instructed in footrest placement Locomotion  Pt propels w/c approximately 50 -60 feet Mod I before c/o of faigue                Other Treatments  Wife instructed in and redemonstrated safe guarding during the following : w/c to car transfer with Rw SBA; gait X 50 feet with RW SBA on Lt side  Therapy/Group: Individual Therapy ital Signs Laurabelle Gorczyca,JIM 10/06/2011, 9:40 AM

## 2011-10-06 NOTE — Progress Notes (Signed)
Physical Therapy Discharge Summary  Patient Details  Name: Todd Cook MRN: [id number] Date of Birth: 1941-09-12 Today's Date: 10/06/2011 Treatment Session: 11:11-11:58 (47 minutes) and 14:28-15:30 (62 minutes) Patient and wife participated in skilled physical therapy sessions focusing on simulated bed <> w/c transfers with focus on patient safety and wife giving appropriate cues for w/c parts management for safe transfer.  Patient also performed supine, R sidelying and seated there ex for LLE increased ROM and strength: 10 reps each: L quad sets, glute sets, hip IR, hip ABD, SAQ, knee flexion, sidelying hip flexion stretch, hip extension, hip ABD, seated LAQ, HS curls, partial squats, bilat UE scap retraction rows and tricep extensions with orange theraband.  Patient tolerated well but required pain meds during there ex secondary to pain in hamstring; RN notified; patient also noted to be very fatigued during session.  During pm session continued family education with w/c mobility training up and down ramp with wife supervision behind patient; discussed safety issues with wife regarding ramp.  Patient's w/c and RW delivered; RW adjusted to patient height.  W/c wheels were not removable, too heavy for patient's wife to load and unload from car and did not include cushion or amputee support pad.  [employer] w/c not appropriate for patient to D/C home with; pt to D/C with loaner chair from [facility] until more appropriate w/c can be delivered from [facility].  Educated wife on w/c parts management for loading and unloading [place] w/c in and out of car with removal of drive wheels to lighten chair for lifting.  Reassessment of patient's LLE strength and AROM. Patient noted to have decreased hip flexion, knee extension and knee flexion; educated patient and wife again on proper positioning in bed or chair and stretches for contracture prevention.  Educated patient on ways to pressure relieve in w/c (lat  leans, anterior lean and boosting) and frequency of pressure relief.  Patient performed w/c <> toilet transfers with wife supervision with RW; patient able to stand and don/doff pants with one UE support and supervision.  Patient also performed ambulation x 50' with RW and supervision.     Patient has met 9 of 9 long term goals due to improved activity tolerance, improved balance, increased strength, decreased pain and sequencing.  Patient to discharge at a wheelchair level Supervision for longer distances and at a supervision level for household ambulation with RW   Patient's care partner (wife) is independent to provide the necessary physical and cognitive (cueing) assistance at discharge.  Recommendation:  Patient will benefit from ongoing skilled PT services in home health setting to continue to advance safe functional mobility, address ongoing impairments in LE and UE weakness, decreased ROM, decreased activity tolerance, balance and gait impairments and minimize fall risk.  Equipment: Equipment provided: loaner w/c from [employer] until [employer] able to provide more appropriate w/c, RW  Patient/family agrees with progress made and goals achieved: Yes  Edman Circle Faucette 10/06/2011, 12:42 PM

## 2011-10-06 NOTE — Progress Notes (Signed)
Patient ID: Todd Cook, male   DOB: 1940-12-12, 70 y.o.   MRN: [id number] Patient ID: Todd Cook, male   DOB: April 16, 1941, 70 y.o.   MRN: [id number] Patient ID: Todd Cook, male   DOB: Jul 20, 1941, 70 y.o.   MRN: [id number]   Subjective; Busy day yesterday.  Pain better overall  FATE CASTER is an 70 y.o. Male. With history of CKD, DM, PVD with chronic left great toe ulcer; admitted 09/18/11 with chest pain due to NSTEMI. Pt underwent PTCA with stent placement on 09/20/11 by Dr. Sanjuana Kava. Evaluated by Dr Darrick Penna and as aortogram with evidence of severe PVD, surgery recommended. 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 Post op with ABLA with Hgb down to 6.7 requiring transfusion. Blood culture X 2 and one positive for strep viridans. ID feels likely contaminated and monitor for now.     Review of Systems  Constitutional: Negative for fever and chills.  HENT: Positive for hearing loss.  Eyes: Negative.  Respiratory: Negative. Negative for cough.  Cardiovascular: Negative.  Musculoskeletal: Positive for joint pain. Mainly left stump pain which is improving Skin: Negative.  Psychiatric/Behavioral: The patient has insomnia.  right leg painful but showing improvement    Functional History:  Prior Function  Level of Independence: Independent with basic ADLs;Independent with homemaking with ambulation;Independent with homemaking with wheelchair;Independent with gait  Able to Take Stairs?: Yes  Driving: Yes  Vocation: Retired  Functional Status:  Mobility:  Bed Mobility  Bed Mobility: Yes  Supine to Sit: 4: Min assist;HOB flat;Other (comment) (Min guard A for bridging to EOB after VC's)  Supine to Sit Details (indicate cue type and reason): struggle to get up onto elbows then powered up well  Transfers  Transfers: Yes  Sit to Stand: 4: Min assist;From bed;1: +1 Total assist  Sit to Stand Details (indicate cue type and reason): vc's for hand placement;visual cues for  technique; manual A for forward w/shift  Stand to Sit: 4: Min assist;To chair/3-in-1  Stand to Sit Details: vc for hand placement  Squat Pivot Transfers: 1: +2 Total assist;Patient percentage (comment);From elevated surface;With armrests (pt 80%)  Squat Pivot Transfer Details (indicate cue type and reason): pt felt much more confortable with squat-pivot transfer than full stand. Educated pt on chair set-up to right for ease of transfers  Ambulation/Gait  Ambulation/Gait: Yes  Ambulation/Gait Assistance: 3: Mod assist;Patient percentage (comment) (pt =70%; with RW)  Ambulation/Gait Assistance Details (indicate cue type and reason): vc/visual cues for "swing to " technique; manual A to maneuver RW  Ambulation Distance (Feet): 10 Feet  Assistive device: Rolling walker  Gait Pattern: (swing to gait pattern)  Stairs: No  Wheelchair Mobility  Wheelchair Mobility: No  ADL:  ADL  Eating/Feeding: Simulated;Set up  Where Assessed - Eating/Feeding: Edge of bed  Grooming: Performed  Where Assessed - Grooming: Sitting, bed  Upper Body Bathing: Simulated;Chest;Right arm;Left arm;Abdomen;Set up  Where Assessed - Upper Body Bathing: Sitting, bed  Lower Body Bathing: Simulated;Maximal assistance  Where Assessed - Lower Body Bathing: Sit to stand from bed  Upper Body Dressing: Performed;Set up  Upper Body Dressing Details (indicate cue type and reason): with donning gown  Where Assessed - Upper Body Dressing: Sitting, bed  Lower Body Dressing: Simulated;Maximal assistance  Where Assessed - Lower Body Dressing: Sit to stand from bed  Toilet Transfer: Not assessed  Toilet Transfer Method: Not assessed  Toileting - Clothing Manipulation: Not assessed  Where Assessed - Toileting Clothing Manipulation: Not assessed  Toileting -  Hygiene: Not assessed  Where Assessed - Toileting Hygiene: Not assessed  Tub/Shower Transfer: Not assessed  Tub/Shower Transfer Method: Not assessed  Equipment Used: Rolling  walker  Cognition:  Cognition  Arousal/Alertness: Awake/alert  Orientation Level: Oriented X4  Cognition  Arousal/Alertness: Awake/alert  Overall Cognitive Status: Appears within functional limits for tasks assessed  Orientation Level: Oriented X4  BP 130/69  Pulse 73  Temp(Src) 98.8 F (37.1 C) (Oral)  Resp 18  Ht 5\' 10"  (1.778 m)  Wt 76.8 kg (169 lb 5 oz)  BMI 24.29 kg/m2  SpO2 98%    Physical Exam  Constitutional: He appears well-developed.  HENT:  Mouth/Throat: Abnormal dentition.  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  Moderate edema, no drainage.  A little hypersensitive to touch.  Resolving ecchymosis.  RLE intact, no edema. Neurological: A sensory deficit is present.  Reflex Scores:  Tricep reflexes are 1+ on the right side and 1+ on the left side.  Bicep reflexes are 1+ on the right side and 1+ on the left side.  Brachioradialis reflexes are 1+ on the right side and 1+ on the left side.  Patellar reflexes are 1+ on the right side.  Achilles reflexes are 1+ on the right side. Alert and oriented x3 with cues.. Stocking glove sensory loss to mid calf RLE, Some tingling in both hands. Strength in upper extremities 4-5/5. RLE grossly 4-5/5 with some slight diminishment distally. Left leg 2/5 at hip. I didn't test knee.  L stump bulbous no erythema or drainage R foot without lesions Patient also is hard of hearing. Still slightly disoriented.  Marland Kitchen  Post Admission Physician Evaluation:  1. Functional deficits secondary to Left BKA 2. Patient admitted to receive collaborative, interdisciplinary care between the physiatrist, rehab nursing staff, and therapy team. 3. Patient's level of medical complexity and substantial therapy needs in context of that medical necessity cannot be provided at a lesser intensity of care. 4. Patient has experienced substantial functional loss from his/her baseline. Please see above functional evals for premorbid and current  levels. Judging by the patient's diagnosis, physical exam, and functional history, the patient has potential for functional progress which will result in measurable gains while on inpatient rehab. These gains will be of substantial and practical use upon discharge in facilitating mobility and self-care at the household level. 5. Physiatrist will provide 24 hour management of medical needs as well as oversight of the therapy plan/treatment and provide guidance as appropriate regarding the interaction of the two. 6. 24 hour rehab nursing will assist in the management of bladder management, bowel management, safety, skin/wound care, pain management and patient education and help integrate therapy concepts, techniques,education, etc. 7. PT will assess and treat for: balance, locomotion, strength and transferring. Goals are: independent with assistive device/ to perhaps supervision. OT will assess and treat for: bathing, bowel/bladder control, dressing, psychosocial adjustment, toileting and transferring . Goals are: independent with assistive device to supervision  8. SLP will potentially assess and treat for: cognition and awareness. Goals are: independent. (Will allow OT and PT observe for safety needs for now before making a formal consult) 9. Case Management and Social Worker will assess and treat for psychological issues and discharge planning. 10. Team conference will be held weekly to assess progress toward goals and to determine barriers to discharge. 11. Patient will receive at least 3 hours of therapy per day at least 5 days per week. 12. ELOS and Prognosis: 7-10 days excellent Medical Problem List and  Plan:  1. DVT Prophylaxis/Anticoagulation: Pharmaceutical: Lovenox  2. Pain Management: Pain limiting activity. Will add long acting pain medication to help with consistent pain relief...need to carefully watch cognition however. Patient is already massaging leg, and I encouraged further sensory  feedback to reduce pain. Discussed w/pt and his wife 3. Mood: Monitor. Remains motivated.  Patient Active Hospital Problem List: 4. Non-STEMI (non-ST elevated myocardial infarction) (09/18/2011) POA: Yes Assessment:  Plan Continue Aspirin, plavix and crestor 5. DM (diabetes mellitus) type I uncontrolled  Assessment: Blood sugars variable currently.  , Plan: Monitor with AC/HS cbg checks. Use SSI for tighter blood sugar control and to promote wound healing. Continue tradjenta, Increase amaryl to home dose of 4mg  and D/Clantus 6. Iron deficiency anemia (09/18/2011) POA: Unknown Assessment:Hgb stable post transfusion vs today  Check stool guaiacs. Add iron supplement. H and H improved. Plan: Check stool guaiacs. Add iron supplement.  7. Gout (09/18/2011) POA: Yes Assessment: no symptoms currently  Plan: Continue allopurinol daily.  8. HTN (hypertension) (09/18/2011) POA: Yes Assessment: borderline. Observe with activity. Pain also affecting. Same meds for now Plan: Monitor with BID checks. Continue hydralazine and metoprolol.  9. Cardiomyopathy, ischemic (09/20/2011) POA: Yes Assessment:  Plan: Monitor for fluid overload. Continue hydralazine and check daily weights. On lopressor but no ace noted likely due to renal insufficiency.  10. Periph vascular disorder (09/18/2011) POA: Yes  Assessment:  Plan: Pressure relief measures and monitor for breakdown.  11. Chronic Kidney Disease: POA: Yes  Assessment: BUN/Creatinine @47 /1.72 at admission.  Labs yesterday show gradual improvement.  Plan: Monitor with routine checks. Avoid nephrotoxic med's.  12. Insomnia: Scheduled trazodone effective.  Continue to monitor  F/U with Dr Riley Kill at [facility]

## 2011-10-06 NOTE — Progress Notes (Signed)
Social Work Discharge Note Discharge Note  The overall goal for the admission was met for:   Discharge location: Yes  Length of Stay: Yes  Discharge activity level: Yes  Home/community participation: Yes  Services provided included: MD, RD, PT, OT, RN, CM, TR, Pharmacy and SW  Financial Services: Private Insurance: [employer]  Follow-up services arranged:  HHRN, PT and OT via [facility]  DME:  [id number] Breezy w/c with L amputee support pad, basic cushion, rolling walker and 3n1 commode via [employer]  Tub transfer bench via [facility]    Comments (or additional information):  [employer] has contract with [employer] therefore DME referral was placed with [employer]  Patient/Family verbalized understanding of follow-up arrangements: Yes  Individual responsible for coordination of the follow-up plan: pt/wife  Confirmed correct DME delivered: Yousra Ivens 10/06/2011    Jesseca Marsch

## 2011-10-06 NOTE — Progress Notes (Signed)
Ambulating with rolling walker min A. Pt in good spirits. Minimal pain control requested. Pain controlled with current medication regemin. Left leg redressed by OT for education with wife and patient on stump care. Incision clean with staples. No redness or swelling noted. Ace wrap applied. Pt continent of bowel and bladder. Pt participating with therapies. Continue plan of care.

## 2011-10-07 NOTE — Progress Notes (Signed)
Patient ID: Todd Cook, male   DOB: 1941/01/06, 70 y.o.   MRN: [id number]   Subjective; Busy day yesterday.  Pain better overall  Todd Cook is an 70 y.o. Male. With history of CKD, DM, PVD with chronic left great toe ulcer; admitted 09/18/11 with chest pain due to NSTEMI. Pt underwent PTCA with stent placement on 09/20/11 by Dr. Sanjuana Kava. Evaluated by Dr Darrick Penna and as aortogram with evidence of severe PVD, surgery recommended. 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 Post op with ABLA with Hgb down to 6.7 requiring transfusion. Blood culture X 2 and one positive for strep viridans. ID feels likely contaminated and monitor for now.     Review of Systems  Constitutional: Negative for fever and chills.  HENT: Positive for hearing loss.  Eyes: Negative.  Respiratory: Negative. Negative for cough.  Cardiovascular: Negative.  Musculoskeletal: Positive for joint pain. Mainly left stump pain which is improving Skin: Negative.  Psychiatric/Behavioral: The patient has insomnia.  right leg painful but showing improvement    Functional History:  Prior Function  Level of Independence: Independent with basic ADLs;Independent with homemaking with ambulation;Independent with homemaking with wheelchair;Independent with gait  Able to Take Stairs?: Yes  Driving: Yes  Vocation: Retired  Functional Status:  Mobility:  Bed Mobility  Bed Mobility: Yes  Supine to Sit: 4: Min assist;HOB flat;Other (comment) (Min guard A for bridging to EOB after VC's)  Supine to Sit Details (indicate cue type and reason): struggle to get up onto elbows then powered up well  Transfers  Transfers: Yes  Sit to Stand: 4: Min assist;From bed;1: +1 Total assist  Sit to Stand Details (indicate cue type and reason): vc's for hand placement;visual cues for technique; manual A for forward w/shift  Stand to Sit: 4: Min assist;To chair/3-in-1  Stand to Sit Details: vc for hand placement  Squat Pivot Transfers: 1: +2  Total assist;Patient percentage (comment);From elevated surface;With armrests (pt 80%)  Squat Pivot Transfer Details (indicate cue type and reason): pt felt much more confortable with squat-pivot transfer than full stand. Educated pt on chair set-up to right for ease of transfers  Ambulation/Gait  Ambulation/Gait: Yes  Ambulation/Gait Assistance: 3: Mod assist;Patient percentage (comment) (pt =70%; with RW)  Ambulation/Gait Assistance Details (indicate cue type and reason): vc/visual cues for "swing to " technique; manual A to maneuver RW  Ambulation Distance (Feet): 10 Feet  Assistive device: Rolling walker  Gait Pattern: (swing to gait pattern)  Stairs: No  Wheelchair Mobility  Wheelchair Mobility: No  ADL:  ADL  Eating/Feeding: Simulated;Set up  Where Assessed - Eating/Feeding: Edge of bed  Grooming: Performed  Where Assessed - Grooming: Sitting, bed  Upper Body Bathing: Simulated;Chest;Right arm;Left arm;Abdomen;Set up  Where Assessed - Upper Body Bathing: Sitting, bed  Lower Body Bathing: Simulated;Maximal assistance  Where Assessed - Lower Body Bathing: Sit to stand from bed  Upper Body Dressing: Performed;Set up  Upper Body Dressing Details (indicate cue type and reason): with donning gown  Where Assessed - Upper Body Dressing: Sitting, bed  Lower Body Dressing: Simulated;Maximal assistance  Where Assessed - Lower Body Dressing: Sit to stand from bed  Toilet Transfer: Not assessed  Toilet Transfer Method: Not assessed  Toileting - Clothing Manipulation: Not assessed  Where Assessed - Toileting Clothing Manipulation: Not assessed  Toileting - Hygiene: Not assessed  Where Assessed - Toileting Hygiene: Not assessed  Tub/Shower Transfer: Not assessed  Tub/Shower Transfer Method: Not assessed  Equipment Used: Rolling walker  Cognition:  Cognition  Arousal/Alertness: Awake/alert  Orientation Level: Oriented X4  Cognition  Arousal/Alertness: Awake/alert  Overall Cognitive  Status: Appears within functional limits for tasks assessed  Orientation Level: Oriented X4  BP 139/71  Pulse 72  Temp(Src) 97.8 F (36.6 C) (Oral)  Resp 20  Ht 5\' 10"  (1.778 m)  Wt 75.7 kg (166 lb 14.2 oz)  BMI 23.95 kg/m2  SpO2 99%    Physical Exam  Constitutional: He appears well-developed.  HENT:  Mouth/Throat: Abnormal dentition.  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  Moderate edema, no drainage.  A little hypersensitive to touch.  Resolving ecchymosis.  RLE intact, no edema. Neurological: A sensory deficit is present.  Reflex Scores:  Tricep reflexes are 1+ on the right side and 1+ on the left side.  Bicep reflexes are 1+ on the right side and 1+ on the left side.  Brachioradialis reflexes are 1+ on the right side and 1+ on the left side.  Patellar reflexes are 1+ on the right side.  Achilles reflexes are 1+ on the right side. Alert and oriented x3 with cues.. Stocking glove sensory loss to mid calf RLE, Some tingling in both hands. Strength in upper extremities 4-5/5. RLE grossly 4-5/5 with some slight diminishment distally. Left leg 2/5 at hip. I didn't test knee.  L stump bulbous no erythema or drainage R foot without lesions Patient also is hard of hearing. Still slightly disoriented.  Marland Kitchen  Post Admission Physician Evaluation:  1. Functional deficits secondary to Left BKA- Stable for D/C Medical Problem List and Plan:  1. DVT Prophylaxis/Anticoagulation: Pharmaceutical: Lovenox  2. Pain Management: Pain limiting activity. Will add long acting pain medication to help with consistent pain relief...need to carefully watch cognition however. Patient is already massaging leg, and I encouraged further sensory feedback to reduce pain. Discussed w/pt and his wife 3. Mood: Monitor. Remains motivated.  Patient Active Hospital Problem List: 4. Non-STEMI (non-ST elevated myocardial infarction) (09/18/2011) POA: Yes Assessment:  Plan Continue Aspirin, plavix and  crestor 5. DM (diabetes mellitus) type I uncontrolled  Assessment: Blood sugars variable currently.  , Plan: Monitor with AC/HS cbg checks. Use SSI for tighter blood sugar control and to promote wound healing. Continue tradjenta, Increase amaryl to home dose of 4mg  and D/Clantus 6. Iron deficiency anemia (09/18/2011) POA: Unknown Assessment:Hgb stable post transfusion vs today  Check stool guaiacs. Add iron supplement. H and H improved. Plan: Check stool guaiacs. Add iron supplement.  7. Gout (09/18/2011) POA: Yes Assessment: no symptoms currently  Plan: Continue allopurinol daily.  8. HTN (hypertension) (09/18/2011) POA: Yes Assessment: borderline. Observe with activity. Pain also affecting. Same meds for now Plan: Monitor with BID checks. Continue hydralazine and metoprolol.  9. Cardiomyopathy, ischemic (09/20/2011) POA: Yes Assessment:  Plan: Monitor for fluid overload. Continue hydralazine and check daily weights. On lopressor but no ace noted likely due to renal insufficiency.  10. Periph vascular disorder (09/18/2011) POA: Yes  Assessment:  Plan: Pressure relief measures and monitor for breakdown.  11. Chronic Kidney Disease: POA: Yes  Assessment: BUN/Creatinine @47 /1.72 at admission.  Labs yesterday show gradual improvement.  Plan: Monitor with routine checks. Avoid nephrotoxic med's.  12. Insomnia: Scheduled trazodone effective.  Continue to monitor  F/U with Dr Riley Kill at [facility]

## 2011-10-09 LAB — GLUCOSE, CAPILLARY: Glucose-Capillary: 143 mg/dL — ABNORMAL HIGH (ref 70–99)

## 2011-10-09 NOTE — Discharge Summary (Signed)
  Discharge summary [id number] EAV#[id number] [facility]#[id number]

## 2011-10-10 ENCOUNTER — Telehealth: Payer: Self-pay | Admitting: Cardiovascular Disease

## 2011-10-10 NOTE — Discharge Summary (Signed)
[name], [name] NO.:  [id number]  MEDICAL RECORD NO.:  [id number]  LOCATION:  4037                         FACILITY:  [facility]  PHYSICIAN:  Erick Colace, M.D.DATE OF BIRTH:  1941/07/23  DATE OF ADMISSION:  09/29/2011 DATE OF DISCHARGE:  10/07/2011                              DISCHARGE SUMMARY   DISCHARGE SECONDARY DIAGNOSES: 1. 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 2. Non-ST-segment elevation myocardial infarction status requiring     percutaneous transluminal coronary angioplasty and stent placement. 3. Diabetes mellitus type 2. 4. Gout. 5. Hypertension. 6. Ischemic cardiomyopathy. 7. Peripheral vascular disease. 8. Acute blood loss anemia, superimposed on iron deficiency anemia. 9. Acute on Chronic renal insufficiency, improved.  HISTORY OF PRESENT ILLNESS:  Todd Cook is a 70 year old male with a history of chronic kidney disease, diabetes mellitus, peripheral vascular disease with chronic left great toe ulcer.  He was admitted on September 18, 2011, with chest pain due to NST MI.  He underwent PTCA with stent placement on November 7 th by Dr. Clifton James.  He was evaluated by Dr. Darrick Penna and aortogram showed evidence of severe peripheral vascular disease.  Surgery was recommended and patient underwent left BKA on September 26, 2011.  Postop with acute blood loss anemia with hemoglobin down to 6.7 requiring 2 units of packed red blood cells.  He was also noted to have one blood culture positive for strep viridans.  However ID Felt that this was contaminated andrecommended monitoring for now.  Therapies were initiated and the patient is noted to have impairments in mobility.  He was evaluated by rehab and we felt that he would benefit from a CIR program.  PAST MEDICAL HISTORY:  Significant for chronic kidney disease, angina, anemia, diabetes mellitus, peripheral vascular disease, hypertension, coronary artery disease, gout left  foot, and shortness of breath.  PAST SURGICAL HISTORY:  Significant for CABG 10 years ago, knee arthroplasty, excision of bilateral cataracts.  FAMILY HISTORY:  Noncontributory.  SOCIAL HISTORY:  The patient is married, was independent prior to admission.  He has never smoked and does not use any smokeless tobacco. Does not use any alcohol or illicit drugs.  ALLERGIES:  ALTACE.  FUNCTIONAL HISTORY:  The patient was independent for ADLs and ambulation prior to admission.  He was able to navigate stairs and was still driving.  Functional status, the patient was min assist for bed mobility.  Min assist for sit to stand, +1 total assist, 80% for the squat pivot transfers.  He was able to ambulate 10 feet, +2 total assist 70%.  He was at max assist for lower body bathing and dressing.  PHYSICAL EXAMINATION:  VITAL SIGNS:  Blood pressure 150/65, pulse 59, temperature 97.6. GENERAL:  The patient is well-nourished, well-developed male, no acute distress. HEENT.  Oral mucosa is pink and moist with borderline dentition. NECK:  Supple without JVD or lymphadenopathy. CARDIAC:  Heart shows regular rate and rhythm without murmurs, gallops, or rubs. LUNGS:  Clear to auscultation bilaterally without wheezes, rales, or rhonchi. ABDOMEN:  Soft, nontender.  Positive bowel sounds. EXTREMITIES:  Left BKA site to be with dry dressing.  Hamstrings noted to be tight and the patient with difficulty  fully extension past the -15 degrees. NEUROLOGIC:  The patient is alert and oriented x3 with cues stocking- glove sensory loss to mid calf right lower extremity with and some tingling reported in both hands.  Strength in upper extremities 4 to 5/5.  Right lower extremity is grossly 4-5/5 with slight diminishment distally.  Left leg is 2/5 at hip.  The patient with some decreased hearing and noted to be slightly disoriented.  HOSPITAL COURSE:  Todd Cook was admitted to rehab on September 29, 2011,  for inpatient therapies to consist of PT, OT at least 3 hours 5 days a week.  Past admission, physiatrist, rehab, RN, and therapy team have worked together to provide customized collaborative interdisciplinary care.  Rehab RN has worked with the patient on bowel and bladder program as well as wound care monitoring.  The patient's CBGs were checked on a.c. and nightly basis.  His Amaryl was increased to his home dose of 4 mg a day and Lantus was discontinued.  Labs were done to follow up on his anemia.  CBC of November 19 revealed hemoglobin 9.3, hematocrit 28.2, white count 7.4, platelets 356.  His H and H was noted to be improved past transfusion.  His renal status was rechecked and labs of October 19 showed much improvement with sodium 135, potassium 4.9, chloride 103, CO2 23, BUN 32, creatinine 1.71.  Mild elevation in LFTs noted with AST 42, ALT 63, albumin 2.4.  His CBGs were checked on a.c. and nightly basis during this stay.  P.o. intake had improved.  Blood sugars at the time of discharge are ranging from 130 to an occasional high in 200s in a 24-hour period.  His wound has been monitored along.  Incision was noted to be clean and dry, intact with staples in place.  No redness noted.  He is noted to have minimal edema at his stump site.  During patient's stay in rehab, weekly team conferences were held to monitor patient's progress, set goals, as well as discuss barriers to discharge.  Physical therapy has been working with the patient on activity tolerance, balance strengthening as well as educating patient on desensitization of his left BKA.  The patient has been educated in home exercise plan to help with knee flexion, knee extension.  The patient and wife have been educated about proper positioning as well as stretches for contracture prevention.  They have also been educated on pressure relief measures.  By the time of discharge, the patient had progressed to being at  supervision level for transfers with wheelchair. He is at supervision level for ambulating 150 feet with rolling walker. Wife has been educated about providing supervisory cues for safety with mobility.  OT has worked with the patient on ADL tasks.  The patient was able to perform ADLs, sit to stand at sink at supervisor level.  He was able to perform toilet transfers with supervision level.  He declined showering and practicing stimulated tub shower transfers, as he was anxious about doing this even with wound covered.  Wife was educated on residual limb compression wrapping and is independent with this. Further followup outpatient PT, OT, and home health RN had [facility] up [facility].  On October 07, 2011, the patient is discharged to home.  DISCHARGE MEDICATIONS: 1. Xanax 0.25 mg b.i.d. p.r.n. anxiety. 2. Plavix 75 mg a day. 3. Tradjenta 5 mg p.o. per day. 4. Flexeril 5 mg p.o. t.i.d. p.r.n. spasms. 5. Metoprolol 50 mg b.i.d.  6. Percocet 5-325 1-2 p.o. q.4 h. p.r.n. pain. 7. Amaryl 2 mg p.o. b.i.d. 8. Allopurinol 300 mg p.o. per day. 9. Coated aspirin 81 mg a day. 10.Colchicine 0.6 mg a day. 11.Losartan 100 mg p.o. per day. 12.Zocor 20 mg p.o. at bedtime.  DIET INSTRUCTION:  Carb-modified medium.  ACTIVITY:  Activity level is at 24-hours supervision walk with assistance, supervision for ADLs.  FOLLOWUP:  The patient to follow up with Dr. Riley Kill in 4 weeks.  Follow up with Dr. Darrick Penna for postop check and staple removal in the next 1-2 weeks.  Follow up with Dr. Clifton James in 2-3 weeks.  Follow up with primary MD for medical issues.     Delle Reining, P.A.   ______________________________ Erick Colace, M.D.    PL/MEDQ  D:  10/09/2011  T:  10/10/2011  Job:  [id number]  cc:   Verne Carrow, MD Janetta Hora. Darrick Penna, MD

## 2011-10-10 NOTE — Progress Notes (Signed)
Social Work Patient ID: Todd Cook, male   DOB: 05-Aug-1941, 70 y.o.   MRN: [id number]   [facility] not in network, referral made to [employer] which has no lightweight wheelchair's. Referral made to [employer] which does have a contract with [employer]. To deliver wheelchair To pt's home.  [facility] does have contract with [facility] Medicare so can provide PT,RN Numerous calls made to pt's wife to correct the situation. Set now with discharge needs.  Wife aware will call This worker if questions or concerns arise.

## 2011-10-10 NOTE — Telephone Encounter (Signed)
Spoke with family member and appt made for pt to see Tereso Newcomer, PA on October 24, 2011 at 2:00.

## 2011-10-10 NOTE — Telephone Encounter (Signed)
New message:  Pt called requesting [facility] appt.  Had stent on 11-8.  Please check schedule and see where to schedule this appt and advise patient

## 2011-10-23 ENCOUNTER — Encounter: Payer: Self-pay | Admitting: Vascular Surgery

## 2011-10-24 ENCOUNTER — Ambulatory Visit (INDEPENDENT_AMBULATORY_CARE_PROVIDER_SITE_OTHER): Payer: Medicare PPO | Admitting: Physician Assistant

## 2011-10-24 ENCOUNTER — Encounter: Payer: Self-pay | Admitting: Physician Assistant

## 2011-10-24 VITALS — BP 112/60 | HR 54 | Resp 18 | Ht 70.0 in | Wt 176.0 lb

## 2011-10-24 DIAGNOSIS — I739 Peripheral vascular disease, unspecified: Secondary | ICD-10-CM

## 2011-10-24 DIAGNOSIS — I25810 Atherosclerosis of coronary artery bypass graft(s) without angina pectoris: Secondary | ICD-10-CM

## 2011-10-24 DIAGNOSIS — I6529 Occlusion and stenosis of unspecified carotid artery: Secondary | ICD-10-CM

## 2011-10-24 DIAGNOSIS — E785 Hyperlipidemia, unspecified: Secondary | ICD-10-CM

## 2011-10-24 DIAGNOSIS — I10 Essential (primary) hypertension: Secondary | ICD-10-CM

## 2011-10-24 DIAGNOSIS — I2589 Other forms of chronic ischemic heart disease: Secondary | ICD-10-CM

## 2011-10-24 DIAGNOSIS — I255 Ischemic cardiomyopathy: Secondary | ICD-10-CM

## 2011-10-24 DIAGNOSIS — I4891 Unspecified atrial fibrillation: Secondary | ICD-10-CM

## 2011-10-24 HISTORY — DX: Hyperlipidemia, unspecified: E78.5

## 2011-10-24 HISTORY — DX: Atherosclerosis of coronary artery bypass graft(s) without angina pectoris: I25.810

## 2011-10-24 LAB — BASIC METABOLIC PANEL
BUN: 31 mg/dL — ABNORMAL HIGH (ref 6–23)
Calcium: 8.9 mg/dL (ref 8.4–10.5)
Chloride: 108 mEq/L (ref 96–112)
Creatinine, Ser: 1.7 mg/dL — ABNORMAL HIGH (ref 0.4–1.5)
GFR: 43.7 mL/min — ABNORMAL LOW (ref >60.00)

## 2011-10-24 NOTE — Progress Notes (Signed)
[address] [place], [place]  [address] Phone: [phone] Fax:  [phone]  Date:  10/24/2011   Name:  Todd Cook       DOB:  09-24-1941 MRN:  [id number]  PCP:  Dr. Chilton Si Primary Cardiologist:  Dr. Lewayne Bunting    History of Present Illness: Todd Cook is a 70 y.o. male who presents for post hospitalization follow up.  He has a history of CAD, status post prior bypass surgery in 2000, PAD, status post right CEA in 2000, chronic kidney disease, diabetes, hypertension and gout.  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  He ruled in for NSTEMI.  After hydration for elevated creatinine from CKD, he underwent LHC 09/20/11: LM 20%, pLAD 70% and mid occluded, CFX and RCA occluded, oL-LAD 60%, pSVG-RCA 80% and dist 40%, pSVG-OM2 30%, pSVG-OM3 20%.  PCI: Promus DES to [facility].  Echocardiogram 09/20/11: Mid to distal anterior, septal and apical akinesis, mild AI, mild MR, mild LAE, EF 35%.  Carotid Dopplers: RICA without significant stenosis; LICA 60-79%.  He also developed paroxysmal atrial fibrillation. He was not placed on Coumadin due to the need for dual antiplatelet therapy as well as possible surgical intervention for his osteomyelitis. His osteomyelitis was treated with antibiotics and he was seen by vascular surgery.  Dr. Verne Carrow performed peripheral angiogram 09/25/11.  Bilateral renal arteries were patent.  His left anterior tibial, posterior tibial and peroneal arteries were occluded and there were no options for intervention.  Left BKA was recommended by vascular surgery and this was performed 11/13 by Dr. Darrick Penna.   With his ischemic cardiomyopathy, no ACE inhibitor was initiated due to chronic kidney disease.  He was discharged to rehabilitation.  He was discharged from rehabilitation 10/07/11.  Labs 10/02/11: Potassium 4.9, creatinine 1.71, hemoglobin 9.3.  Since discharge from rehabilitation, he is doing well.  He  denies chest pain.  He denies dyspnea with activity.  He denies syncope.  He denies palpitations.  He denies orthopnea, PND or edema.  He is actually on losartan.  He has not had any blood work since discharge from the hospital.  He sees Dr. Darrick Penna later this week.  Past Medical History  Diagnosis Date  . Chronic kidney disease   . Anemia   . Diabetes mellitus   . 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 Coronary artery disease     Status post CABG in 2000; status post NSTEMI 11/12 -LHC 09/20/11: LM 20%, pLAD 70% and mid occluded, CFX and RCA occluded, oL-LAD 60%, pSVG-RCA 80% and dist 40%, pSVG-OM2 30%, pSVG-OM3 20%.  PCI: [facility]us DES to [facility]   . Ischemic cardiomyopathy     Echocardiogram 09/20/11: Mid to distal anterior, septal and apical akinesis, mild AI, mild MR, mild LAE, EF 35%.  . Gout     left foot  . Carotid stenosis     Carotid Dopplers: RICA without significant stenosis; LICA 60-79%.  . Atrial fibrillation     Coumadin deferred due to the need for Plavix and aspirin post DES for NSTEMI/PCI 11/12  . HLD (hyperlipidemia)     Current Outpatient Prescriptions  Medication Sig Dispense Refill  . allopurinol (ZYLOPRIM) 300 MG tablet Take 300 mg by mouth daily.        Marland Kitchen aspirin EC 81 MG tablet Take 81 mg by mouth daily.        . colchicine 0.6  MG tablet Take 0.6 mg by mouth daily.        . ferrous sulfate (FERROUSUL) 325 (65 FE) MG tablet Take 325 mg by mouth daily with breakfast.        . metoprolol (LOPRESSOR) 50 MG tablet Take 1 tablet (50 mg total) by mouth 2 (two) times daily.  60 tablet  1  . sitaGLIPtin (JANUVIA) 100 MG tablet Take 100 mg by mouth daily.        Marland Kitchen ALPRAZolam (XANAX) 0.25 MG tablet Take 1 tablet (0.25 mg total) by mouth 2 (two) times daily as needed for anxiety.  30 tablet  0  . clopidogrel (PLAVIX) 75 MG tablet Take 1 tablet (75 mg total) by mouth daily with breakfast.  30 tablet  1  . glimepiride (AMARYL) 2 MG tablet  Take 1 tablet (2 mg total) by mouth 2 (two) times daily.  60 tablet  1  . linagliptin (TRADJENTA) 5 MG TABS tablet Take 1 tablet (5 mg total) by mouth daily.  30 tablet  1  . losartan (COZAAR) 100 MG tablet Take 100 mg by mouth daily.        . simvastatin (ZOCOR) 20 MG tablet Take 20 mg by mouth at bedtime.          Allergies: Allergies  Allergen Reactions  . Altace Other (See Comments)    unknown    History  Substance Use Topics  . Smoking status: Never Smoker   . Smokeless tobacco: Not on file  . Alcohol Use: No     PHYSICAL EXAM: VS:  BP 112/60  Pulse 54  Resp 18  Ht 5\' 10"  (1.778 m)  Wt 176 lb (79.833 kg)  BMI 25.25 kg/m2 Well nourished, well developed, in no acute distress HEENT: normal Neck: no JVD At 90 degrees Cardiac:  normal S1, S2; RRR; no murmur Lungs:  clear to auscultation bilaterally, no wheezing, rhonchi or rales Abd: soft, nontender Ext: no edema in RLE: RFA site without hematoma or bruit Skin: warm and dry Neuro:  CNs 2-12 intact, no focal abnormalities noted Psych: Normal affect  EKG:   Sinus bradycardia, heart rate 54, normal axis, T wave inversions in 1, aVL, V1 through 3, septal Q waves, PVCs  ASSESSMENT AND PLAN:

## 2011-10-24 NOTE — Assessment & Plan Note (Signed)
He has moderate LICA disease by carotid Dopplers.  He has followup arranged with Dr. Darrick Penna later this week.

## 2011-10-24 NOTE — Assessment & Plan Note (Signed)
He is maintaining normal sinus rhythm.  I discussed his case today with Dr. Ladona Ridgel.  The incremental benefit of adding Coumadin to aspirin and Plavix is outweighed by the significant bleeding risk that is incurred.  Therefore, he will be maintained on aspirin and Plavix only for now.  We will need to consider adding Coumadin after he can discontinue Plavix one year post PCI.

## 2011-10-24 NOTE — Patient Instructions (Addendum)
Your physician recommends that you schedule a follow-up appointment in: 1 month  Your physician recommends that you return for lab work in: today (BMP)  Your physician has requested that you have an echocardiogram. Echocardiography is a painless test that uses sound waves to create images of your heart. It provides your doctor with information about the size and shape of your heart and how well your heart's chambers and valves are working. This procedure takes approximately one hour. There are no restrictions for this procedure. (1 WEEK BEFORE APPT WITH DR Ladona Ridgel)

## 2011-10-24 NOTE — Assessment & Plan Note (Signed)
Doing well post PCI with a drug-eluting stent.  Continue aspirin and Plavix.  Followup with Dr. Ladona Ridgel in 2-3 months.

## 2011-10-24 NOTE — Assessment & Plan Note (Signed)
Managed by PCP

## 2011-10-24 NOTE — Assessment & Plan Note (Signed)
No obvious signs or symptoms of congestive heart failure.  Continue ARB and beta blocker.  Check a basic metabolic panel today to followup on his renal function and potassium due to ARB therapy in the setting of chronic kidney disease.  Followup with Dr. Ladona Ridgel in 2-3 months.  We will obtain a followup echocardiogram prior to that visit to reassess his LV function.

## 2011-10-24 NOTE — Assessment & Plan Note (Signed)
Controlled.  

## 2011-10-25 ENCOUNTER — Encounter: Payer: Self-pay | Admitting: Vascular Surgery

## 2011-10-26 ENCOUNTER — Encounter: Payer: Self-pay | Admitting: Vascular Surgery

## 2011-10-26 ENCOUNTER — Ambulatory Visit (INDEPENDENT_AMBULATORY_CARE_PROVIDER_SITE_OTHER): Payer: Medicare PPO | Admitting: Vascular Surgery

## 2011-10-26 VITALS — BP 123/63 | HR 48 | Temp 97.6°F | Resp 16 | Ht 70.0 in | Wt 176.0 lb

## 2011-10-26 DIAGNOSIS — I70209 Unspecified atherosclerosis of native arteries of extremities, unspecified extremity: Secondary | ICD-10-CM

## 2011-10-26 NOTE — Progress Notes (Signed)
The patient returns today for followup after a left below-knee amputation on 09/26/2011. He reports no pain in the stump. He has no drainage.  Physical Exam: Left below-knee amputation is well-healed.  Right foot has a callus over the first metatarsal head the no obvious ulceration   Assessment: Healed left below-knee amputation, staples removed today, shrinker prescribed today  Callus right foot with known PAD we'll schedule evaluation Podiatry  Fabienne Bruns, MD [facility] Office: [phone] Pager: [phone]

## 2011-10-27 ENCOUNTER — Ambulatory Visit: Payer: Medicare PPO | Admitting: Vascular Surgery

## 2011-10-31 ENCOUNTER — Encounter: Payer: Medicare PPO | Admitting: Physical Medicine & Rehabilitation

## 2011-11-13 ENCOUNTER — Encounter: Payer: Medicare PPO | Attending: Physical Medicine & Rehabilitation | Admitting: Physical Medicine & Rehabilitation

## 2011-11-13 NOTE — Assessment & Plan Note (Signed)
Todd Cook is back regarding his left below-knee amputation.  He was on rehab until October 07, 2011.  He was discharged home.  He is receiving home health therapies.  He is walking up to about 20 or 25 feet with his walker at home and then gets out of breath.  He is generally using a wheelchair for the blind shear of his movement around the home.  He is having no pain in the left leg.  Left leg has healed nicely.  He has been wearing his shrinker.  He follows up with [facility] I believe this week.  REVIEW OF SYSTEMS:  Notable for the above.  He does have some high sugars at times.  Full 12-point review is in the written health and history section of the chart.  SOCIAL HISTORY:  Unchanged.  He is married and wife is present with him today.  PHYSICAL EXAMINATION:  VITAL SIGNS:  Blood pressure is 148/41, pulse 52, respiratory rate 14 and he is satting 98% on room air. GENERAL:  The patient is pleasant, alert and oriented x3.  Affect is generally bright and appropriate.  EXTREMITIES:  Strength in the upper extremities is 5/5.  Right lower extremity is 5/5 as well.  The left hip is 4/5 with flexion, 4+ with extension.  Knee extension and flexion are 4-4+/5.  Left leg is well formed.  He does have a nodule of callus over the lateral leg at the fibular head.  It is nontender.  The nodule measures about 2-3 cm in diameter and is about a centimeter high. Sensory exam is grossly intact in the left leg.  Right leg is intact neurovascularly, but maybe a bit diminished still with fine touch in the foot. HEART:  Regular. CHEST:  Clear. ABDOMEN:  Soft, nontender.  ASSESSMENT: 1. History of left below-knee amputation 2. History of ischemic cardiomyopathy and gout.  PLAN: 1. The patient is doing quite well at this point.  I think he is a K3     ambulator.  I think he is ready for prosthetic fitting.  The nodule     at the left fibular head will be problematic from pressure      standpoint. 2. Continue with stump shrinker for now.  He is doing a good job with     proper application 3. We will write for physical therapy once fitting process is coming     to an end.  I recommended [facility].     Todd Cook, M.D. Electronically Signed    ZTS/MedQ D:  11/13/2011 10:15:31  T:  11/13/2011 11:18:12  Job #:  [id number]  cc:   Janetta Hora. Fields, MD [address], [place] [address]  Verne Carrow, MD [address] [place] [address]

## 2011-12-01 ENCOUNTER — Ambulatory Visit (HOSPITAL_COMMUNITY): Payer: Medicare Other | Attending: Cardiovascular Disease | Admitting: Radiology

## 2011-12-01 ENCOUNTER — Telehealth: Payer: Self-pay | Admitting: Internal Medicine

## 2011-12-01 DIAGNOSIS — I4891 Unspecified atrial fibrillation: Secondary | ICD-10-CM | POA: Insufficient documentation

## 2011-12-01 DIAGNOSIS — Z951 Presence of aortocoronary bypass graft: Secondary | ICD-10-CM | POA: Insufficient documentation

## 2011-12-01 DIAGNOSIS — I2589 Other forms of chronic ischemic heart disease: Secondary | ICD-10-CM

## 2011-12-01 DIAGNOSIS — I428 Other cardiomyopathies: Secondary | ICD-10-CM | POA: Insufficient documentation

## 2011-12-01 NOTE — Telephone Encounter (Signed)
New Problem   Patient wife would like a return call to find out if patient should come to f/u appnt on 12/08/11.  Patient received results of test over the phone.

## 2011-12-01 NOTE — Telephone Encounter (Signed)
Advised pt to keep appt.

## 2011-12-01 NOTE — Progress Notes (Signed)
Pt notified of echo results

## 2011-12-04 ENCOUNTER — Encounter: Payer: Self-pay | Admitting: Internal Medicine

## 2011-12-08 ENCOUNTER — Ambulatory Visit: Payer: Medicare PPO | Admitting: Internal Medicine

## 2011-12-08 ENCOUNTER — Ambulatory Visit (INDEPENDENT_AMBULATORY_CARE_PROVIDER_SITE_OTHER): Payer: Medicare Other | Admitting: Internal Medicine

## 2011-12-08 ENCOUNTER — Encounter: Payer: Self-pay | Admitting: Internal Medicine

## 2011-12-08 DIAGNOSIS — I10 Essential (primary) hypertension: Secondary | ICD-10-CM

## 2011-12-08 DIAGNOSIS — I255 Ischemic cardiomyopathy: Secondary | ICD-10-CM

## 2011-12-08 DIAGNOSIS — I4891 Unspecified atrial fibrillation: Secondary | ICD-10-CM

## 2011-12-08 DIAGNOSIS — I2589 Other forms of chronic ischemic heart disease: Secondary | ICD-10-CM

## 2011-12-08 DIAGNOSIS — I739 Peripheral vascular disease, unspecified: Secondary | ICD-10-CM

## 2011-12-08 NOTE — Patient Instructions (Signed)
.  Your physician wants you to follow-up in: 08/2012 with Dr Court Joy will receive a reminder letter in the mail two months in advance. If you don't receive a letter, please call our office to schedule the follow-up appointment.

## 2011-12-08 NOTE — Assessment & Plan Note (Addendum)
His ventricular rate is well controlled. We'll continue his current medical therapy for now. He were made on his beta blocker. We will call his pharmacy and make sure he is only on one type of beta blocker.

## 2011-12-08 NOTE — Assessment & Plan Note (Signed)
His blood pressure is well controlled. He will continue his current medical therapy. 

## 2011-12-08 NOTE — Assessment & Plan Note (Signed)
He denies anginal symptoms. He will continue his current medications. 

## 2011-12-08 NOTE — Progress Notes (Signed)
HPI [name] returns today for followup. He is a very pleasant 71 year old man with a history of coronary artery disease, status post stenting hypertension, diabetes, and atrial fibrillation. The patient denies chest pain or shortness of breath.   Allergies  Allergen Reactions  . Altace Other (See Comments)    unknown     Current Outpatient Prescriptions  Medication Sig Dispense Refill  . allopurinol (ZYLOPRIM) 300 MG tablet Take 300 mg by mouth daily.        Marland Kitchen aspirin EC 81 MG tablet Take 81 mg by mouth daily.        . clopidogrel (PLAVIX) 75 MG tablet Take 1 tablet (75 mg total) by mouth daily with breakfast.  30 tablet  1  . colchicine 0.6 MG tablet Take 0.6 mg by mouth daily.        . ferrous sulfate (FERROUSUL) 325 (65 FE) MG tablet Take 325 mg by mouth daily with breakfast.        . losartan (COZAAR) 100 MG tablet Take 100 mg by mouth daily.        . metoprolol (LOPRESSOR) 50 MG tablet Take 1 tablet (50 mg total) by mouth 2 (two) times daily.  60 tablet  1  . metoprolol (TOPROL-XL) 50 MG 24 hr tablet Take 50 mg by mouth daily.        . simvastatin (ZOCOR) 20 MG tablet Take 20 mg by mouth at bedtime.        . sitaGLIPtin (JANUVIA) 100 MG tablet Take 100 mg by mouth daily.        Marland Kitchen glimepiride (AMARYL) 2 MG tablet Take 1 tablet (2 mg total) by mouth 2 (two) times daily.  60 tablet  1     Past Medical History  Diagnosis Date  . Chronic kidney disease   . Anemia   . Diabetes mellitus   . 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 Coronary artery disease     Status post CABG in 2000; status post NSTEMI 11/12 -LHC 09/20/11: LM 20%, pLAD 70% and mid occluded, CFX and RCA occluded, oL-LAD 60%, pSVG-RCA 80% and dist 40%, pSVG-OM2 30%, pSVG-OM3 20%.  PCI: Promus DES to [facility]   . Ischemic cardiomyopathy     Echocardiogram 09/20/11: Mid to distal anterior, septal and apical akinesis, mild AI, mild MR, mild LAE, EF 35%.  . Gout     left foot  .  Carotid stenosis     Carotid Dopplers: RICA without significant stenosis; LICA 60-79%.  . Atrial fibrillation     Coumadin deferred due to the need for Plavix and aspirin post DES for NSTEMI/PCI 11/12  . HLD (hyperlipidemia)   . SOB (shortness of breath)   . MI (myocardial infarction)     ROS:   All systems reviewed and negative except as noted in the HPI.   Past Surgical History  Procedure Date  . 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 Knee arthroplasty     2000  . Cataract extraction, bilateral   . Eye surgery   . Cardiac catheterization   . Amputation 09/26/2011    Procedure: AMPUTATION BELOW KNEE;  Surgeon: Sherren Kerns, MD;  Location: [facility] OR;  Service: Vascular;  Laterality: Left;     No family history on file.   History   Social History  . Marital Status: Married    Spouse Name: N/A    Number of Children:  N/A  . Years of Education: N/A   Occupational History  . Not on file.   Social History Main Topics  . Smoking status: Never Smoker   . Smokeless tobacco: Not on file  . Alcohol Use: No  . Drug Use: No  . Sexually Active: No   Other Topics Concern  . Not on file   Social History Narrative  . No narrative on file     BP 130/52  Pulse 46  Ht 5\' 10"  (1.778 m)  Wt 84.278 kg (185 lb 12.8 oz)  BMI 26.66 kg/m2  Physical Exam:  Well appearing NAD HEENT: Unremarkable Neck:  No JVD, no thyromegally Lymphatics:  No adenopathy Back:  No CVA tenderness Lungs:  Clear HEART:  IRegular rate rhythm, no murmurs, no rubs, no clicks Abd:  soft, positive bowel sounds, no organomegally, no rebound, no guarding Ext:  S/p below the knee amputation. Skin:  No rashes no nodules Neuro:  CN II through XII intact, motor grossly intact.  Assess/Plan:

## 2011-12-11 ENCOUNTER — Ambulatory Visit: Payer: Medicare Other | Attending: Physical Medicine & Rehabilitation | Admitting: Physical Therapy

## 2011-12-11 DIAGNOSIS — Z5189 Encounter for other specified aftercare: Secondary | ICD-10-CM | POA: Insufficient documentation

## 2011-12-11 DIAGNOSIS — R5381 Other malaise: Secondary | ICD-10-CM | POA: Insufficient documentation

## 2011-12-11 DIAGNOSIS — M6281 Muscle weakness (generalized): Secondary | ICD-10-CM | POA: Insufficient documentation

## 2011-12-11 DIAGNOSIS — R269 Unspecified abnormalities of gait and mobility: Secondary | ICD-10-CM | POA: Insufficient documentation

## 2011-12-13 ENCOUNTER — Ambulatory Visit: Payer: Medicare Other | Admitting: Physical Therapy

## 2011-12-18 ENCOUNTER — Encounter: Payer: Medicare Other | Admitting: Physical Therapy

## 2011-12-20 ENCOUNTER — Ambulatory Visit: Payer: Medicare Other | Attending: Physical Medicine & Rehabilitation | Admitting: Physical Therapy

## 2011-12-20 DIAGNOSIS — R5381 Other malaise: Secondary | ICD-10-CM | POA: Insufficient documentation

## 2011-12-20 DIAGNOSIS — Z5189 Encounter for other specified aftercare: Secondary | ICD-10-CM | POA: Insufficient documentation

## 2011-12-20 DIAGNOSIS — R269 Unspecified abnormalities of gait and mobility: Secondary | ICD-10-CM | POA: Insufficient documentation

## 2011-12-20 DIAGNOSIS — M6281 Muscle weakness (generalized): Secondary | ICD-10-CM | POA: Insufficient documentation

## 2011-12-21 ENCOUNTER — Ambulatory Visit: Payer: Medicare Other | Admitting: Physical Therapy

## 2011-12-26 ENCOUNTER — Ambulatory Visit: Payer: Medicare Other | Admitting: Physical Therapy

## 2011-12-28 ENCOUNTER — Ambulatory Visit: Payer: Medicare Other | Admitting: Physical Therapy

## 2011-12-28 ENCOUNTER — Encounter: Payer: Medicare Other | Admitting: Physical Therapy

## 2012-01-01 ENCOUNTER — Ambulatory Visit: Payer: Medicare Other | Admitting: Physical Therapy

## 2012-01-01 ENCOUNTER — Encounter: Payer: Medicare Other | Admitting: Physical Therapy

## 2012-01-04 ENCOUNTER — Ambulatory Visit: Payer: Medicare Other | Admitting: Physical Therapy

## 2012-01-09 ENCOUNTER — Ambulatory Visit: Payer: Medicare Other | Admitting: Physical Therapy

## 2012-01-11 ENCOUNTER — Ambulatory Visit: Payer: Medicare Other | Admitting: Physical Therapy

## 2012-01-15 ENCOUNTER — Ambulatory Visit: Payer: Medicare Other | Attending: Physical Medicine & Rehabilitation | Admitting: Physical Therapy

## 2012-01-15 DIAGNOSIS — R5381 Other malaise: Secondary | ICD-10-CM | POA: Insufficient documentation

## 2012-01-15 DIAGNOSIS — Z5189 Encounter for other specified aftercare: Secondary | ICD-10-CM | POA: Insufficient documentation

## 2012-01-15 DIAGNOSIS — R269 Unspecified abnormalities of gait and mobility: Secondary | ICD-10-CM | POA: Insufficient documentation

## 2012-01-15 DIAGNOSIS — M6281 Muscle weakness (generalized): Secondary | ICD-10-CM | POA: Insufficient documentation

## 2012-01-16 ENCOUNTER — Encounter: Payer: Medicare Other | Admitting: Physical Therapy

## 2012-01-17 ENCOUNTER — Ambulatory Visit: Payer: Medicare Other | Admitting: Physical Therapy

## 2012-01-22 ENCOUNTER — Ambulatory Visit: Payer: Medicare Other | Admitting: Physical Therapy

## 2012-01-25 ENCOUNTER — Ambulatory Visit: Payer: Medicare Other | Admitting: Physical Therapy

## 2012-01-30 ENCOUNTER — Ambulatory Visit: Payer: Medicare Other | Admitting: Physical Therapy

## 2012-02-01 ENCOUNTER — Encounter: Payer: Medicare Other | Admitting: Physical Therapy

## 2012-02-01 ENCOUNTER — Ambulatory Visit: Payer: Medicare Other | Admitting: Physical Therapy

## 2012-02-05 ENCOUNTER — Ambulatory Visit: Payer: Medicare Other | Admitting: Physical Therapy

## 2012-02-06 ENCOUNTER — Encounter: Payer: Medicare Other | Admitting: Physical Therapy

## 2012-02-07 ENCOUNTER — Ambulatory Visit: Payer: Medicare Other | Admitting: Physical Therapy

## 2012-02-08 ENCOUNTER — Encounter: Payer: Medicare Other | Admitting: Physical Therapy

## 2012-02-12 ENCOUNTER — Encounter: Payer: Medicare Other | Admitting: Physical Therapy

## 2012-02-14 ENCOUNTER — Encounter: Payer: Medicare Other | Admitting: Physical Therapy

## 2012-04-19 ENCOUNTER — Ambulatory Visit (HOSPITAL_COMMUNITY)
Admission: RE | Admit: 2012-04-19 | Discharge: 2012-04-19 | Disposition: A | Discharge status: Home / Self Care | Payer: Medicare Other | Source: Ambulatory Visit | Attending: Internal Medicine | Admitting: Internal Medicine

## 2012-04-19 DIAGNOSIS — I739 Peripheral vascular disease, unspecified: Secondary | ICD-10-CM

## 2012-04-19 DIAGNOSIS — I70209 Unspecified atherosclerosis of native arteries of extremities, unspecified extremity: Secondary | ICD-10-CM

## 2012-04-19 DIAGNOSIS — R0989 Other specified symptoms and signs involving the circulatory and respiratory systems: Secondary | ICD-10-CM

## 2012-04-19 DIAGNOSIS — Z951 Presence of aortocoronary bypass graft: Secondary | ICD-10-CM

## 2012-04-19 DIAGNOSIS — Z9889 Other specified postprocedural states: Secondary | ICD-10-CM

## 2012-04-19 NOTE — Progress Notes (Signed)
VASCULAR LAB PRELIMINARY  PRELIMINARY  PRELIMINARY  PRELIMINARY  Carotid duplex completed.    Preliminary report:  Mild to moderate plaque disease bilaterally.  Right CCA has a significant stenosis and the right ICA has a 40 to 59% stenosis that were not noted previously.  There is a 60 to 79% stenosis of the mid to distal ICA on the left.  This is unchanged since prior exam.  Vertebral artery flow is antegrade bilaterally.  Vanna Scotland,   RVT 04/19/2012, 5:45 PM

## 2012-05-01 ENCOUNTER — Other Ambulatory Visit: Payer: Self-pay | Admitting: Internal Medicine

## 2012-05-01 ENCOUNTER — Ambulatory Visit
Admission: RE | Admit: 2012-05-01 | Discharge: 2012-05-01 | Disposition: A | Discharge status: Home / Self Care | Payer: Medicare Other | Source: Ambulatory Visit | Attending: Internal Medicine | Admitting: Internal Medicine

## 2012-05-01 DIAGNOSIS — R52 Pain, unspecified: Secondary | ICD-10-CM

## 2012-09-10 ENCOUNTER — Encounter: Payer: Self-pay | Admitting: Internal Medicine

## 2012-09-10 ENCOUNTER — Ambulatory Visit (INDEPENDENT_AMBULATORY_CARE_PROVIDER_SITE_OTHER): Payer: Medicare Other | Admitting: Internal Medicine

## 2012-09-10 VITALS — BP 153/54 | HR 51 | Ht 70.0 in | Wt 198.0 lb

## 2012-09-10 DIAGNOSIS — I255 Ischemic cardiomyopathy: Secondary | ICD-10-CM

## 2012-09-10 DIAGNOSIS — I10 Essential (primary) hypertension: Secondary | ICD-10-CM

## 2012-09-10 DIAGNOSIS — I2589 Other forms of chronic ischemic heart disease: Secondary | ICD-10-CM

## 2012-09-10 NOTE — Assessment & Plan Note (Signed)
His blood pressure is minimally elevated. I have recommended he reduce his sodium intake.

## 2012-09-10 NOTE — Patient Instructions (Signed)
Your physician recommends that you schedule a follow-up appointment as needed  

## 2012-09-10 NOTE — Assessment & Plan Note (Signed)
His symptoms are well controlled. He denies anginal symptoms or sob.

## 2012-09-10 NOTE — Progress Notes (Signed)
HPI Mr. Mclinden returns today for followup. He is a pleasant 71 yo man with a h/o PAF, HTN, and CAD. He is s/p CABG. The patient denies chest pain, sob, or peripheral edema. No syncope. Allergies  Allergen Reactions  . Ramipril Other (See Comments)    unknown     Current Outpatient Prescriptions  Medication Sig Dispense Refill  . allopurinol (ZYLOPRIM) 300 MG tablet Take 300 mg by mouth daily.        Marland Kitchen aspirin EC 81 MG tablet Take 81 mg by mouth daily.        . clopidogrel (PLAVIX) 75 MG tablet Take 1 tablet (75 mg total) by mouth daily with breakfast.  30 tablet  1  . colchicine 0.6 MG tablet Take 0.6 mg by mouth daily.        . ferrous sulfate (FERROUSUL) 325 (65 FE) MG tablet Take 325 mg by mouth daily with breakfast.        . glimepiride (AMARYL) 4 MG tablet Take 4 mg by mouth 2 (two) times daily.      Marland Kitchen losartan (COZAAR) 100 MG tablet Take 100 mg by mouth daily.        . metFORMIN (GLUCOPHAGE) 850 MG tablet Take 0.5 tablets by mouth Twice daily.       . metoprolol (LOPRESSOR) 50 MG tablet Take 1 tablet (50 mg total) by mouth 2 (two) times daily.  60 tablet  1  . simvastatin (ZOCOR) 20 MG tablet Take 20 mg by mouth at bedtime.        . sitaGLIPtin (JANUVIA) 100 MG tablet Take 100 mg by mouth daily.           Past Medical History  Diagnosis Date  . Chronic kidney disease   . Anemia   . Diabetes mellitus   . 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 Coronary artery disease     Status post CABG in 2000; status post NSTEMI 11/12 -[facility] 09/20/11: LM 20%, pLAD 70% and mid occluded, CFX and RCA occluded, oL-LAD 60%, pSVG-RCA 80% and dist 40%, pSVG-OM2 30%, pSVG-OM3 20%.  PCI: Promus DES to [facility]   . Ischemic cardiomyopathy     Echocardiogram 09/20/11: Mid to distal anterior, septal and apical akinesis, mild AI, mild MR, mild LAE, EF 35%.  . Gout     left foot  . Carotid stenosis     Carotid Dopplers: RICA without significant stenosis; LICA  60-79%.  . Atrial fibrillation     Coumadin deferred due to the need for Plavix and aspirin post DES for NSTEMI/PCI 11/12  . HLD (hyperlipidemia)   . SOB (shortness of breath)   . MI (myocardial infarction)     ROS:   All systems reviewed and negative except as noted in the HPI.   Past Surgical History  Procedure Date  . 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 Knee arthroplasty     2000  . Cataract extraction, bilateral   . Eye surgery   . Cardiac catheterization   . Amputation 09/26/2011    Procedure: AMPUTATION BELOW KNEE;  Surgeon: Sherren Kerns, MD;  Location: [facility] OR;  Service: Vascular;  Laterality: Left;     No family history on file.   History   Social History  . Marital Status: Married    Spouse Name: N/A    Number of Children: N/A  . Years of Education: N/A   Occupational  History  . Not on file.   Social History Main Topics  . Smoking status: Never Smoker   . Smokeless tobacco: Not on file  . Alcohol Use: No  . Drug Use: No  . Sexually Active: No   Other Topics Concern  . Not on file   Social History Narrative  . No narrative on file     BP 153/54  Pulse 51  Ht 5\' 10"  (1.778 m)  Wt 198 lb (89.812 kg)  BMI 28.41 kg/m2  SpO2 98%  Physical Exam:  Well appearing NAD HEENT: Unremarkable Neck:  No JVD, no thyromegally Lungs:  Clear with no wheezes HEART:  Regular rate rhythm, no murmurs, no rubs, no clicks Abd:  soft, positive bowel sounds, no organomegally, no rebound, no guarding Ext:  2 plus pulses, no edema, no cyanosis, no clubbing Skin:  No rashes no nodules Neuro:  CN II through XII intact, motor grossly intact  EKG NSR  Assess/Plan:

## 2013-04-18 ENCOUNTER — Other Ambulatory Visit: Payer: Self-pay | Admitting: Internal Medicine

## 2013-04-18 DIAGNOSIS — IMO0001 Reserved for inherently not codable concepts without codable children: Secondary | ICD-10-CM

## 2013-04-22 ENCOUNTER — Ambulatory Visit
Admission: RE | Admit: 2013-04-22 | Discharge: 2013-04-22 | Disposition: A | Discharge status: Home / Self Care | Payer: Medicare Other | Source: Ambulatory Visit | Attending: Internal Medicine | Admitting: Internal Medicine

## 2013-04-22 DIAGNOSIS — IMO0001 Reserved for inherently not codable concepts without codable children: Secondary | ICD-10-CM

## 2014-04-01 ENCOUNTER — Other Ambulatory Visit: Payer: Self-pay

## 2014-04-01 ENCOUNTER — Ambulatory Visit (INDEPENDENT_AMBULATORY_CARE_PROVIDER_SITE_OTHER): Payer: Commercial Managed Care - HMO

## 2014-04-01 VITALS — BP 137/77 | HR 50 | Resp 18

## 2014-04-01 DIAGNOSIS — I739 Peripheral vascular disease, unspecified: Secondary | ICD-10-CM

## 2014-04-01 DIAGNOSIS — E1140 Type 2 diabetes mellitus with diabetic neuropathy, unspecified: Secondary | ICD-10-CM

## 2014-04-01 DIAGNOSIS — L03119 Cellulitis of unspecified part of limb: Secondary | ICD-10-CM

## 2014-04-01 DIAGNOSIS — E1142 Type 2 diabetes mellitus with diabetic polyneuropathy: Secondary | ICD-10-CM

## 2014-04-01 DIAGNOSIS — L97509 Non-pressure chronic ulcer of other part of unspecified foot with unspecified severity: Secondary | ICD-10-CM

## 2014-04-01 DIAGNOSIS — E1149 Type 2 diabetes mellitus with other diabetic neurological complication: Secondary | ICD-10-CM

## 2014-04-01 DIAGNOSIS — L02619 Cutaneous abscess of unspecified foot: Secondary | ICD-10-CM

## 2014-04-01 MED ORDER — CEPHALEXIN 500 MG PO CAPS: 500.0000 mg | ORAL_CAPSULE | Freq: Three times a day (TID) | ORAL | Status: DC

## 2014-04-01 MED ORDER — SILVER SULFADIAZINE 1 % EX CREA: 1.0000 "application " | TOPICAL_CREAM | Freq: Every day | CUTANEOUS | Status: DC

## 2014-04-01 NOTE — Addendum Note (Signed)
Addended by: Hadley Pen R on: 04/01/2014 02:39 PM   Modules accepted: Orders

## 2014-04-01 NOTE — Addendum Note (Signed)
Addended by: Marylou Mccoy on: 04/01/2014 02:46 PM   Modules accepted: Orders, Medications

## 2014-04-01 NOTE — Patient Instructions (Signed)
ANTIBACTERIAL SOAP INSTRUCTIONS  THE DAY AFTER PROCEDURE  Please follow the instructions your doctor has marked.   Shower as usual. Before getting out, place a drop of antibacterial liquid soap (Dial) on a wet, clean washcloth.  Gently wipe washcloth over affected area.  Afterward, rinse the area with warm water.  Blot the area dry with a soft cloth and cover with antibiotic ointment (neosporin, polysporin, bacitracin) and band aid or gauze and tape Place 3-4 drops of antibacterial liquid soap in a quart of warm tap water.  Submerge foot into water for 20 minutes.  If bandage was applied after your procedure, leave on to allow for easy lift off, then remove and continue with soak for the remaining time.  Next, blot area dry with a soft cloth and cover with a bandage.  Apply other medications as directed by your doctor, such as cortisporin otic solution (eardrops) or neosporin antibiotic ointment  Wash the foot daily soap and water as instructed dry thoroughly then apply Silvadene antibiotic cream and a gauze dressing with paper tape it is recommended maintain dressing changes daily until the wound has resolved  Also maintain antibiotic regimen for 10 days as instructed. Contact the office immediately if there is any changes worsening or exacerbation of the wound increased drainage malodor fever or chills

## 2014-04-01 NOTE — Addendum Note (Signed)
Addended by: Enedina Finner on: 04/01/2014 03:57 PM   Modules accepted: Orders

## 2014-04-01 NOTE — Progress Notes (Signed)
   Subjective:    Patient ID: AIREN MOTTE, male    DOB: Feb 18, 1941, 73 y.o.   MRN: [id number]  HPI I saw Dr. Neva Seat yesterday and said for me to come in and see the doctor and has been going on since last Friday and the callus has been there for a while and kinda touchy and does drain some and I am a diabetic for about 35 to 40 years and I had my left foot and leg amputated     Review of Systems  Hematological: Bruises/bleeds easily.       Objective:   Physical Exam Lower extremity objective findings as follows patient is became dictation on the left side present this time on his right leg sub-first MTP area developed a callus which is blistered is no bleeding and draining for just under a week. Patient indicates no significant pain or discomfort as he is profoundly neuropathic 73 year old male well-developed well-nourished oriented x3. She objective findings vascular status intact with pedal pulses palpable DP and PT thready at one over 4 bilateral capillary refill time 4 seconds all digits skin temperature warm to cool turgor diminished there is some slight erythema around first MTP joint plantarly no ascending psoas lymphangitis there is blistered lesion approximately a centimeter by centimeter and a half in diameter sub-first MTP area right with overlying hemorrhagic keratoses. The ulcer site is debrided the basement which is produced or drainage is cultured a cultured sensitivity will be submitted and followup at this time is recommended within 2 weeks      Assessment & Plan:  Assessment this time diabetes with neuropathy and angiopathy ulcer sub-first MTP area right with localized cellulitis of the lesion is debrided dressed with cleansed with all cleansed Silvadene gauze dressing is applied initiate topical antibiotic regimen of Silvadene and cephalexin 500 mg 3 times a day x10 days reappointed 2 weeks for followup also/possible patient wearing diabetic shoes with modified insoles  and will continue to do so as instructed recheck in 2 weeks as recommended next or Alvan Dame DPM

## 2014-04-04 LAB — WOUND CULTURE
Gram Stain: NONE SEEN
Gram Stain: NONE SEEN
Gram Stain: NONE SEEN
ORGANISM ID, BACTERIA: NO GROWTH

## 2014-04-21 ENCOUNTER — Other Ambulatory Visit (HOSPITAL_COMMUNITY): Payer: Self-pay | Admitting: Internal Medicine

## 2014-04-21 DIAGNOSIS — I6529 Occlusion and stenosis of unspecified carotid artery: Secondary | ICD-10-CM

## 2014-04-22 ENCOUNTER — Ambulatory Visit (INDEPENDENT_AMBULATORY_CARE_PROVIDER_SITE_OTHER): Payer: Commercial Managed Care - HMO

## 2014-04-22 VITALS — BP 134/81 | HR 51 | Temp 97.3°F | Resp 15 | Ht 70.5 in | Wt 192.0 lb

## 2014-04-22 DIAGNOSIS — I739 Peripheral vascular disease, unspecified: Secondary | ICD-10-CM

## 2014-04-22 DIAGNOSIS — E1149 Type 2 diabetes mellitus with other diabetic neurological complication: Secondary | ICD-10-CM

## 2014-04-22 DIAGNOSIS — L97509 Non-pressure chronic ulcer of other part of unspecified foot with unspecified severity: Secondary | ICD-10-CM

## 2014-04-22 DIAGNOSIS — E1140 Type 2 diabetes mellitus with diabetic neuropathy, unspecified: Secondary | ICD-10-CM

## 2014-04-22 DIAGNOSIS — E1142 Type 2 diabetes mellitus with diabetic polyneuropathy: Secondary | ICD-10-CM

## 2014-04-22 NOTE — Progress Notes (Signed)
   Subjective:    Patient ID: Todd Cook, male    DOB: [birth year], 73 y.o.   MRN: [id number]  HPI Comments: Pt states his foot is getting better.  The right 1st MPJ is without redness, drainage or swelling.     Review of Systems no new systemic findings or changes noted     Objective:   Physical Exam Neurovascular status is intact pedal pulses are palpable although diminished epicritic sensations noted the ulcer sub-1 right is great result there's some hemorrhage a keratoses which is debrided down to dermal level only there is no active bleeding no discharge or drainage noted no signs of infection no secondary cellulitis or lymphangitis noted patient does have some dystrophy of the nail right hallux nail has some fissuring and cracking distal one half is debrided away at this time and since it's loosened are separated from the nailbed itself to contusion or injury to the toes patient is been unaware of injury or damage however the ulcer and nail growth are likely the results of injury or contusion. The ulcer is resolving at this time maintain Silvadene dressing for another 3 or 4 days and then can discontinue if no additional drainage is noted       Assessment & Plan:  Assessment resolving ulceration diabetic neuropathy right foot sub-first MTP area the ulcers result down to dermal level the last necrotic tissue and some macerated tissue is debrided away and Silvadene gauze dressing is applied maintain dressing as possible through this weekend and discontinue maintain appropriate shoes may be candidate for placement diabetic insoles at some point in the future however currently the insoles are working well we'll continue monitor recheck in 2-3 months for long-term diabetic footcare and followup in the future as needed  Alvan Dame DPM

## 2014-04-22 NOTE — Patient Instructions (Signed)
Diabetes and Foot Care Diabetes may cause you to have problems because of poor blood supply (circulation) to your feet and legs. This may cause the skin on your feet to become thinner, break easier, and heal more slowly. Your skin may become dry, and the skin may peel and crack. You may also have nerve damage in your legs and feet causing decreased feeling in them. You may not notice minor injuries to your feet that could lead to infections or more serious problems. Taking care of your feet is one of the most important things you can do for yourself.  HOME CARE INSTRUCTIONS  Wear shoes at all times, even in the house. Do not go barefoot. Bare feet are easily injured.  Check your feet daily for blisters, cuts, and redness. If you cannot see the bottom of your feet, use a mirror or ask someone for help.  Wash your feet with warm water (do not use hot water) and mild soap. Then pat your feet and the areas between your toes until they are completely dry. Do not soak your feet as this can dry your skin.  Apply a moisturizing lotion or petroleum jelly (that does not contain alcohol and is unscented) to the skin on your feet and to dry, brittle toenails. Do not apply lotion between your toes.  Trim your toenails straight across. Do not dig under them or around the cuticle. File the edges of your nails with an emery board or nail file.  Do not cut corns or calluses or try to remove them with medicine.  Wear clean socks or stockings every day. Make sure they are not too tight. Do not wear knee-high stockings since they may decrease blood flow to your legs.  Wear shoes that fit properly and have enough cushioning. To break in new shoes, wear them for just a few hours a day. This prevents you from injuring your feet. Always look in your shoes before you put them on to be sure there are no objects inside.  Do not cross your legs. This may decrease the blood flow to your feet.  If you find a minor scrape,  cut, or break in the skin on your feet, keep it and the skin around it clean and dry. These areas may be cleansed with mild soap and water. Do not cleanse the area with peroxide, alcohol, or iodine.  When you remove an adhesive bandage, be sure not to damage the skin around it.  If you have a wound, look at it several times a day to make sure it is healing.  Do not use heating pads or hot water bottles. They may burn your skin. If you have lost feeling in your feet or legs, you may not know it is happening until it is too late.  Make sure your health care provider performs a complete foot exam at least annually or more often if you have foot problems. Report any cuts, sores, or bruises to your health care provider immediately. SEEK MEDICAL CARE IF:   You have an injury that is not healing.  You have cuts or breaks in the skin.  You have an ingrown nail.  You notice redness on your legs or feet.  You feel burning or tingling in your legs or feet.  You have pain or cramps in your legs and feet.  Your legs or feet are numb.  Your feet always feel cold. SEEK IMMEDIATE MEDICAL CARE IF:   There is increasing redness,   swelling, or pain in or around a wound.  There is a red line that goes up your leg.  Pus is coming from a wound.  You develop a fever or as directed by your health care provider.  You notice a bad smell coming from an ulcer or wound. Document Released: 10/27/2000 Document Revised: 07/02/2013 Document Reviewed: 04/08/2013 [employer] Patient Information 2014 [employer]  

## 2014-04-23 ENCOUNTER — Ambulatory Visit (HOSPITAL_COMMUNITY)
Admission: RE | Admit: 2014-04-23 | Discharge: 2014-04-23 | Disposition: A | Discharge status: Home / Self Care | Payer: Medicare HMO | Source: Ambulatory Visit | Attending: Internal Medicine | Admitting: Internal Medicine

## 2014-04-23 ENCOUNTER — Other Ambulatory Visit (HOSPITAL_COMMUNITY): Payer: Self-pay | Admitting: Internal Medicine

## 2014-04-23 DIAGNOSIS — I6529 Occlusion and stenosis of unspecified carotid artery: Secondary | ICD-10-CM

## 2014-04-23 NOTE — Progress Notes (Signed)
*  PRELIMINARY RESULTS* Vascular Ultrasound Carotid Duplex (Doppler) has been completed.  Preliminary findings:  Right = Severe distal CCA stenosis noted. Because of this stenosis, cannot determine stenosis range of ICA, but most likely in 40-59% range. Left = proximal to mid ICA stenosis in the >80% range, based on systolic velocity, and 60-79%, based on diastolic velocity.  Farrel Demark, RDMS, RVT  04/23/2014, 11:20 AM

## 2014-04-24 ENCOUNTER — Ambulatory Visit (HOSPITAL_COMMUNITY)
Admission: RE | Admit: 2014-04-24 | Discharge: 2014-04-24 | Disposition: A | Discharge status: Home / Self Care | Payer: Medicare HMO | Source: Ambulatory Visit | Attending: Internal Medicine | Admitting: Internal Medicine

## 2014-04-24 DIAGNOSIS — I6529 Occlusion and stenosis of unspecified carotid artery: Secondary | ICD-10-CM

## 2014-05-18 ENCOUNTER — Other Ambulatory Visit (HOSPITAL_COMMUNITY): Payer: Self-pay | Admitting: Internal Medicine

## 2014-05-18 DIAGNOSIS — Z9889 Other specified postprocedural states: Secondary | ICD-10-CM

## 2014-05-19 ENCOUNTER — Other Ambulatory Visit (HOSPITAL_COMMUNITY): Payer: Self-pay | Admitting: Internal Medicine

## 2014-05-19 DIAGNOSIS — Z9889 Other specified postprocedural states: Secondary | ICD-10-CM

## 2014-05-21 ENCOUNTER — Ambulatory Visit (HOSPITAL_COMMUNITY)
Admission: RE | Admit: 2014-05-21 | Discharge: 2014-05-21 | Disposition: A | Discharge status: Home / Self Care | Payer: Medicare HMO | Source: Ambulatory Visit | Attending: Internal Medicine | Admitting: Internal Medicine

## 2014-05-21 DIAGNOSIS — E119 Type 2 diabetes mellitus without complications: Secondary | ICD-10-CM | POA: Insufficient documentation

## 2014-05-21 DIAGNOSIS — E785 Hyperlipidemia, unspecified: Secondary | ICD-10-CM | POA: Diagnosis not present

## 2014-05-21 DIAGNOSIS — I6529 Occlusion and stenosis of unspecified carotid artery: Secondary | ICD-10-CM | POA: Diagnosis not present

## 2014-05-21 DIAGNOSIS — I10 Essential (primary) hypertension: Secondary | ICD-10-CM | POA: Insufficient documentation

## 2014-05-21 DIAGNOSIS — Z9889 Other specified postprocedural states: Secondary | ICD-10-CM

## 2014-05-21 MED ORDER — IOHEXOL 350 MG/ML SOLN
50.0000 mL | Freq: Once | INTRAVENOUS | Status: AC | PRN
  Administered 2014-05-21: 50 mL via INTRAVENOUS

## 2014-05-29 ENCOUNTER — Encounter: Payer: Self-pay | Admitting: Vascular Surgery

## 2014-06-01 ENCOUNTER — Encounter: Payer: Self-pay | Admitting: Vascular Surgery

## 2014-06-01 ENCOUNTER — Ambulatory Visit (INDEPENDENT_AMBULATORY_CARE_PROVIDER_SITE_OTHER): Payer: Commercial Managed Care - HMO | Admitting: Vascular Surgery

## 2014-06-01 VITALS — BP 178/74 | HR 55 | Resp 18 | Ht 70.0 in | Wt 193.0 lb

## 2014-06-01 DIAGNOSIS — Z01818 Encounter for other preprocedural examination: Secondary | ICD-10-CM

## 2014-06-01 DIAGNOSIS — I6529 Occlusion and stenosis of unspecified carotid artery: Secondary | ICD-10-CM | POA: Insufficient documentation

## 2014-06-01 DIAGNOSIS — I6523 Occlusion and stenosis of bilateral carotid arteries: Secondary | ICD-10-CM

## 2014-06-01 DIAGNOSIS — I658 Occlusion and stenosis of other precerebral arteries: Secondary | ICD-10-CM

## 2014-06-01 HISTORY — DX: Occlusion and stenosis of unspecified carotid artery: I65.29

## 2014-06-01 NOTE — Progress Notes (Signed)
New Carotid Referral  Referred by:  Todd SackEdwin Jay Green, MD [address], [address] [place], [place] [address]  Reason for referral: Left carotid stenosis  History of Present Illness  Todd Cook is a 73 y.o. (1940-12-09) male who presents with chief complaint: "abnormal neck scan".  As part of recent physical, this patient had screening carotid studies given prior R CEA history.  An abnormal carotid duplex lead to a CTA Neck.  This demonstrated R distal CCA 71% stenosis, s/p R CEA, LICA 85-90% stenosis.  Patient has never history of TIA or stroke symptom.  The patient has never had amaurosis fugax or monocular blindness.  The patient has never had facial drooping or hemiplegia.  The patient has never had receptive or expressive aphasia.   The patient's risks factors for carotid disease include: DM, HTN, and HLD.  Pt also has history of afib on anticoagulation  Past Medical History  Diagnosis Date  . Chronic kidney disease   . Anemia   . Diabetes mellitus   . 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 Hypertension   . Coronary artery disease     Status post CABG in 2000; status post NSTEMI 11/12 -LHC 09/20/11: LM 20%, pLAD 70% and mid occluded, CFX and RCA occluded, oL-LAD 60%, pSVG-RCA 80% and dist 40%, pSVG-OM2 30%, pSVG-OM3 20%.  PCI: Promus DES to [facility]-RCA   . Ischemic cardiomyopathy     Echocardiogram 09/20/11: Mid to distal anterior, septal and apical akinesis, mild AI, mild MR, mild LAE, EF 35%.  . Gout     left foot  . Carotid stenosis     Carotid Dopplers: RICA without significant stenosis; LICA 60-79%.  . Atrial fibrillation     Coumadin deferred due to the need for Plavix and aspirin post DES for NSTEMI/PCI 11/12  . HLD (hyperlipidemia)   . SOB (shortness of breath)   . MI (myocardial infarction)     Past Surgical History  Procedure Laterality Date  . Coronary artery bypass graft  10 years ago  . Knee arthroplasty      2000  . Cataract  extraction, bilateral    . Eye surgery    . Cardiac catheterization    . Amputation  09/26/2011    Procedure: AMPUTATION BELOW KNEE;  Surgeon: Todd Kernsharles E Fields, MD;  Location: [facility] OR;  Service: Vascular;  Laterality: Left;    History   Social History  . Marital Status: Married    Spouse Name: N/A    Number of Children: N/A  . Years of Education: N/A   Occupational History  . Not on file.   Social History Main Topics  . Smoking status: Never Smoker   . Smokeless tobacco: Never Used  . Alcohol Use: No  . Drug Use: No  . Sexual Activity: No   Other Topics Concern  . Not on file   Social History Narrative  . No narrative on file   Family History: denies significant disease in parents  Current Outpatient Prescriptions on File Prior to Visit  Medication Sig Dispense Refill  . allopurinol (ZYLOPRIM) 300 MG tablet Take 300 mg by mouth daily.        Marland Kitchen. aspirin EC 81 MG tablet Take 81 mg by mouth daily.        . cephALEXin (KEFLEX) 500 MG capsule Take 1 capsule (500 mg total) by mouth 3 (three) times daily.  30 capsule  0  . colchicine  0.6 MG tablet Take 0.6 mg by mouth daily.        . ferrous sulfate (FERROUSUL) 325 (65 FE) MG tablet Take 325 mg by mouth daily with breakfast.        . glimepiride (AMARYL) 4 MG tablet Take 4 mg by mouth 2 (two) times daily.      Marland Kitchen. losartan (COZAAR) 100 MG tablet Take 100 mg by mouth daily.        . metFORMIN (GLUCOPHAGE) 850 MG tablet Take 0.5 tablets by mouth Twice daily.       . simvastatin (ZOCOR) 20 MG tablet Take 20 mg by mouth at bedtime.        . sitaGLIPtin (JANUVIA) 100 MG tablet Take 100 mg by mouth daily.        . metoprolol (LOPRESSOR) 50 MG tablet Take 1 tablet (50 mg total) by mouth 2 (two) times daily.  60 tablet  1  . silver sulfADIAZINE (SILVADENE) 1 % cream Apply 1 application topically daily.  50 g  0   No current facility-administered medications on file prior to visit.    Allergies  Allergen Reactions  . Ramipril Other  (See Comments)    unknown    REVIEW OF SYSTEMS:  (Positives checked otherwise negative)  CARDIOVASCULAR:  []  chest pain, []  chest pressure, []  palpitations, []  shortness of breath when laying flat, []  shortness of breath with exertion,  []  pain in feet when walking, []  pain in feet when laying flat, []  history of blood clot in veins (DVT), []  history of phlebitis, []  swelling in legs, []  varicose veins  PULMONARY:  []  productive cough, []  asthma, []  wheezing  NEUROLOGIC:  []  weakness in arms or legs, []  numbness in arms or legs, []  difficulty speaking or slurred speech, []  temporary loss of vision in one eye, []  dizziness  HEMATOLOGIC:  []  bleeding problems, []  problems with blood clotting too easily  MUSCULOSKEL:  []  joint pain, []  joint swelling  GASTROINTEST:  []  vomiting blood, []  blood in stool     GENITOURINARY:  []  burning with urination, []  blood in urine  PSYCHIATRIC:  []  history of major depression  INTEGUMENTARY:  []  rashes, []  ulcers  CONSTITUTIONAL:  []  fever, []  chills  For VQI Use Only  PRE-ADM LIVING: Home  AMB STATUS: Ambulatory  CAD Sx: History of MI, but no symptoms No MI within 6 months  PRIOR CHF: None  STRESS TEST: [x]  No, [ ]  Normal, [ ]  + ischemia, [ ]  + MI, [ ]  Both  Physical Examination  Filed Vitals:   06/01/14 1538 06/01/14 1539  BP: 147/61 178/74  Pulse: 63 55  Resp: 18   Height: 5\' 10"  (1.778 m)   Weight: 193 lb (87.544 kg)    Body mass index is 27.69 kg/(m^2).  General: A&O x 3, WDWN  Head: Todd Cook  Ear/Nose/Throat: Hearing grossly intact, nares w/o erythema or drainage, oropharynx w/o Erythema/Exudate, Mallampati score: 3  Eyes: PERRLA, EOMI, post-surg changes to lenses  Neck: Supple, no nuchal rigidity or LAD  Pulmonary: Sym exp, good air movt, CTAB, no rales, rhonchi, & wheezing  Cardiac: RRR, Nl S1, S2, no Murmurs, rubs or gallops  Vascular: Vessel Right Left  Radial Palpable Palpable  Brachial Palpable Palpable    Carotid Palpable, without bruit Palpable, without bruit  Aorta  Not palpable N/A  Femoral Palpable Palpable  Popliteal Not palpable Not palpable  PT Not Palpable BKA  DP Not Palpable BKA   Gastrointestinal: soft, NTND, -G/R, -  HSM, - masses, - CVAT B  Musculoskeletal: M/S 5/5 throughout except L BKA, pt able to ambulated with L BKA prosthesis, Extremities without ischemic changes except L BKA Neurologic: CN 2-12 intact , Pain and light touch intact in extremities except L BKA, Motor exam as listed above  Psychiatric: Judgment intact, Mood & affect appropriate for pt's clinical situation  Dermatologic: See M/S exam for extremity exam, no rashes otherwise noted  Lymph : No Cervical, Axillary, or Inguinal lymphadenopathy   Non-Invasive Vascular Imaging  CAROTID DUPLEX (Date: 04/23/14):   Right =Severe stenosis of distal CCA with moderate plaque noted.  Because of this stenosis, cannot determine stenosis range of ICA, most likely in 40-59% range. Also ratio would be inaccurate.  Left = proximal to mid ICA stenosis in range of >80%, based on systolic velocity, and 60-79%, based on diastolic velocity.  Further evaluation of the carotid arteries with additional study including CT angiogram of neck or carotid angiography is recommended.  Antegrade vertebral flow.  CTA Neck (05/21/14)  Post right carotid endarterectomy.   71% diameter stenosis distal right common carotid artery.   High-grade stenosis proximal left internal carotid artery with 85 -90% diameter stenosis 1.2 cm above its origin.   Bilateral internal carotid artery cavernous segment calcified plaque with moderate narrowing.   Mild to moderate narrowing proximal right vertebral artery. Mild narrowing cervical segment right vertebral artery. Moderate to marked narrowing distal right vertebral artery.   Mild to slightly moderate narrowing proximal left vertebral artery. Mild narrowing cervical segment left vertebral artery.  Mild narrowing distal left vertebral artery.   Plaque with mild to slightly moderate narrowing proximal left common carotid artery. Mild plaque and mild narrowing/irregularity remainder of left common carotid artery.   Mild narrowing of the proximal aspect of subclavian artery bilaterally.   Mild to moderate mucosal thickening inferior aspect left maxillary sinus.   Cervical spondylotic changes most notable C5-6 and C6-7   1 cm left parotid lesion possibly intra parotid lymph node. Cannot exclude primary parotid mass.  Based on my review of this patient's CTA, pt has a restenosis in distal R CCA <80%.  On the left, the patient has a >80% stenosis.  A calcified tongue of the plaque appears to extend pass the angle of the mandible.  There are scattered plaques in the aortic arch.  Medical Decision Making  Todd Cook is a 73 y.o. male who presents with: asx recurrent R ICA stenosis <80%, asx L ICA stenosis 80%, history of NSTEMI s/p CABG   Pt's prior L BKA occurred in the setting of NSTEMI so cardiac risk stratification clearly should be obtained.    The extent of L ICA plaque in this patient unfortunately appears to extend higher than the segment of >80% stenosis.  The problem is that a L CEA would need to extend up into that distal segment due to calcification in the tongue of plaque.    I suspect this would require a mandibular subluxation or osteotomy to complete a L CEA.  Given the associated high cranial nerve complication rate, consideration for carotid stenting should be obtained.  I will have Dr. Darrick Penna and Dr. Myra Gianotti review the CTA to give an opinion.  In regards to the R ICA stenosis recurrence.  Due to cranial nerve concerns, frequently they are managed with CAS.  At this point, there is no indication for intervention with only 71% stenosis.  I discussed in depth with the patient the nature of atherosclerosis, and emphasized  the importance of maximal medical management  including strict control of blood pressure, blood glucose, and lipid levels, obtaining regular exercise, antiplatelet agents, and cessation of smoking.   The patient is currently on a statin: Zocor.  The patient is currently on an anti-platelet: ASA.  The patient is aware that without maximal medical management the underlying atherosclerotic disease process will progress, limiting the benefit of any interventions.  The patient is going to follow up with Dr. Darrick Penna after his cardiac work-up is completed.     Thank you for allowing [place] to participate in this patient's care.  Todd Sake, MD [facility] Office: [phone] Pager: [phone]  06/01/2014, 4:32 PM

## 2014-06-03 ENCOUNTER — Telehealth: Payer: Self-pay

## 2014-06-03 NOTE — Telephone Encounter (Signed)
Message copied by Phillips Odor on Wed Jun 03, 2014  4:17 PM ------      Message from: Fransisco Hertz      Created: Wed Jun 03, 2014  3:00 PM       Todd Cook      [id number]      May 05, 1941            I have reviewed the angiogram with Dr. Darrick Penna.  Pt will need B carotid or cerebral angiogram.              Can schedule the procedure with me in next 1-2 weeks.            Arlys John ------

## 2014-06-08 ENCOUNTER — Other Ambulatory Visit: Payer: Self-pay

## 2014-06-09 ENCOUNTER — Encounter (HOSPITAL_COMMUNITY): Payer: Self-pay | Admitting: Pharmacy Technician

## 2014-06-10 MED ORDER — SODIUM CHLORIDE 0.9 % IV SOLN
INTRAVENOUS | Status: DC
  Administered 2014-06-11: 08:00:00 via INTRAVENOUS

## 2014-06-11 ENCOUNTER — Ambulatory Visit (HOSPITAL_COMMUNITY)
Admission: RE | Admit: 2014-06-11 | Discharge: 2014-06-11 | Disposition: A | Discharge status: Home / Self Care | Payer: Medicare HMO | Source: Ambulatory Visit | Attending: Vascular Surgery | Admitting: Vascular Surgery

## 2014-06-11 ENCOUNTER — Encounter (HOSPITAL_COMMUNITY)
Admission: RE | Disposition: A | Discharge status: Home / Self Care | Payer: Self-pay | Source: Ambulatory Visit | Attending: Vascular Surgery

## 2014-06-11 ENCOUNTER — Telehealth: Payer: Self-pay | Admitting: Vascular Surgery

## 2014-06-11 DIAGNOSIS — I6529 Occlusion and stenosis of unspecified carotid artery: Secondary | ICD-10-CM | POA: Insufficient documentation

## 2014-06-11 DIAGNOSIS — Z7982 Long term (current) use of aspirin: Secondary | ICD-10-CM | POA: Diagnosis not present

## 2014-06-11 DIAGNOSIS — E119 Type 2 diabetes mellitus without complications: Secondary | ICD-10-CM | POA: Insufficient documentation

## 2014-06-11 DIAGNOSIS — I771 Stricture of artery: Secondary | ICD-10-CM | POA: Diagnosis not present

## 2014-06-11 DIAGNOSIS — Z951 Presence of aortocoronary bypass graft: Secondary | ICD-10-CM | POA: Insufficient documentation

## 2014-06-11 DIAGNOSIS — E785 Hyperlipidemia, unspecified: Secondary | ICD-10-CM | POA: Insufficient documentation

## 2014-06-11 DIAGNOSIS — I658 Occlusion and stenosis of other precerebral arteries: Secondary | ICD-10-CM | POA: Diagnosis not present

## 2014-06-11 DIAGNOSIS — Z9861 Coronary angioplasty status: Secondary | ICD-10-CM | POA: Diagnosis not present

## 2014-06-11 DIAGNOSIS — I2589 Other forms of chronic ischemic heart disease: Secondary | ICD-10-CM | POA: Diagnosis not present

## 2014-06-11 DIAGNOSIS — Z9889 Other specified postprocedural states: Secondary | ICD-10-CM | POA: Diagnosis not present

## 2014-06-11 DIAGNOSIS — I2510 Atherosclerotic heart disease of native coronary artery without angina pectoris: Secondary | ICD-10-CM | POA: Diagnosis not present

## 2014-06-11 DIAGNOSIS — I4891 Unspecified atrial fibrillation: Secondary | ICD-10-CM | POA: Diagnosis not present

## 2014-06-11 DIAGNOSIS — I129 Hypertensive chronic kidney disease with stage 1 through stage 4 chronic kidney disease, or unspecified chronic kidney disease: Secondary | ICD-10-CM | POA: Insufficient documentation

## 2014-06-11 DIAGNOSIS — I252 Old myocardial infarction: Secondary | ICD-10-CM | POA: Insufficient documentation

## 2014-06-11 DIAGNOSIS — S88119A Complete traumatic amputation at level between knee and ankle, unspecified lower leg, initial encounter: Secondary | ICD-10-CM | POA: Diagnosis not present

## 2014-06-11 DIAGNOSIS — N189 Chronic kidney disease, unspecified: Secondary | ICD-10-CM | POA: Diagnosis not present

## 2014-06-11 HISTORY — PX: CAROTID ANGIOGRAM: SHX5504

## 2014-06-11 LAB — POCT I-STAT, CHEM 8
BUN: 28 mg/dL — AB (ref 6–23)
CHLORIDE: 107 meq/L (ref 96–112)
CREATININE: 1.4 mg/dL — AB (ref 0.50–1.35)
Calcium, Ion: 1.22 mmol/L (ref 1.13–1.30)
Glucose, Bld: 154 mg/dL — ABNORMAL HIGH (ref 70–99)
HCT: 40 % (ref 39.0–52.0)
HEMOGLOBIN: 13.6 g/dL (ref 13.0–17.0)
POTASSIUM: 4.3 meq/L (ref 3.7–5.3)
Sodium: 140 mEq/L (ref 137–147)
TCO2: 22 mmol/L (ref 0–100)

## 2014-06-11 LAB — GLUCOSE, CAPILLARY: Glucose-Capillary: 123 mg/dL — ABNORMAL HIGH (ref 70–99)

## 2014-06-11 SURGERY — CAROTID ANGIOGRAM
Anesthesia: LOCAL | Laterality: Bilateral

## 2014-06-11 MED ORDER — ACETAMINOPHEN 325 MG PO TABS: 650.0000 mg | ORAL_TABLET | ORAL | Status: DC | PRN

## 2014-06-11 MED ORDER — HYDRALAZINE HCL 20 MG/ML IJ SOLN: 10.0000 mg | INTRAMUSCULAR | Status: DC | PRN

## 2014-06-11 MED ORDER — SODIUM CHLORIDE 0.9 % IV SOLN: 1.0000 mL/kg/h | INTRAVENOUS | Status: DC

## 2014-06-11 MED ORDER — HYDRALAZINE HCL 20 MG/ML IJ SOLN
INTRAMUSCULAR | Status: AC
  Administered 2014-06-11: 10 mg
  Filled 2014-06-11: qty 1

## 2014-06-11 NOTE — H&P (View-Only) (Signed)
New Carotid Referral  Referred by:  Enrique SackEdwin Jay Green, MD [address], [address] [place], [place] [address]  Reason for referral: Left carotid stenosis  History of Present Illness  Todd Cook is a 73 y.o. (1940-12-09) male who presents with chief complaint: "abnormal neck scan".  As part of recent physical, this patient had screening carotid studies given prior R CEA history.  An abnormal carotid duplex lead to a CTA Neck.  This demonstrated R distal CCA 71% stenosis, s/p R CEA, LICA 85-90% stenosis.  Patient has never history of TIA or stroke symptom.  The patient has never had amaurosis fugax or monocular blindness.  The patient has never had facial drooping or hemiplegia.  The patient has never had receptive or expressive aphasia.   The patient's risks factors for carotid disease include: DM, HTN, and HLD.  Pt also has history of afib on anticoagulation  Past Medical History  Diagnosis Date  . Chronic kidney disease   . Anemia   . Diabetes mellitus   . 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 Hypertension   . Coronary artery disease     Status post CABG in 2000; status post NSTEMI 11/12 -LHC 09/20/11: LM 20%, pLAD 70% and mid occluded, CFX and RCA occluded, oL-LAD 60%, pSVG-RCA 80% and dist 40%, pSVG-OM2 30%, pSVG-OM3 20%.  PCI: Promus DES to [facility]-RCA   . Ischemic cardiomyopathy     Echocardiogram 09/20/11: Mid to distal anterior, septal and apical akinesis, mild AI, mild MR, mild LAE, EF 35%.  . Gout     left foot  . Carotid stenosis     Carotid Dopplers: RICA without significant stenosis; LICA 60-79%.  . Atrial fibrillation     Coumadin deferred due to the need for Plavix and aspirin post DES for NSTEMI/PCI 11/12  . HLD (hyperlipidemia)   . SOB (shortness of breath)   . MI (myocardial infarction)     Past Surgical History  Procedure Laterality Date  . Coronary artery bypass graft  10 years ago  . Knee arthroplasty      2000  . Cataract  extraction, bilateral    . Eye surgery    . Cardiac catheterization    . Amputation  09/26/2011    Procedure: AMPUTATION BELOW KNEE;  Surgeon: Sherren Kernsharles E Fields, MD;  Location: [facility] OR;  Service: Vascular;  Laterality: Left;    History   Social History  . Marital Status: Married    Spouse Name: N/A    Number of Children: N/A  . Years of Education: N/A   Occupational History  . Not on file.   Social History Main Topics  . Smoking status: Never Smoker   . Smokeless tobacco: Never Used  . Alcohol Use: No  . Drug Use: No  . Sexual Activity: No   Other Topics Concern  . Not on file   Social History Narrative  . No narrative on file   Family History: denies significant disease in parents  Current Outpatient Prescriptions on File Prior to Visit  Medication Sig Dispense Refill  . allopurinol (ZYLOPRIM) 300 MG tablet Take 300 mg by mouth daily.        Marland Kitchen. aspirin EC 81 MG tablet Take 81 mg by mouth daily.        . cephALEXin (KEFLEX) 500 MG capsule Take 1 capsule (500 mg total) by mouth 3 (three) times daily.  30 capsule  0  . colchicine  0.6 MG tablet Take 0.6 mg by mouth daily.        . ferrous sulfate (FERROUSUL) 325 (65 FE) MG tablet Take 325 mg by mouth daily with breakfast.        . glimepiride (AMARYL) 4 MG tablet Take 4 mg by mouth 2 (two) times daily.      Marland Kitchen. losartan (COZAAR) 100 MG tablet Take 100 mg by mouth daily.        . metFORMIN (GLUCOPHAGE) 850 MG tablet Take 0.5 tablets by mouth Twice daily.       . simvastatin (ZOCOR) 20 MG tablet Take 20 mg by mouth at bedtime.        . sitaGLIPtin (JANUVIA) 100 MG tablet Take 100 mg by mouth daily.        . metoprolol (LOPRESSOR) 50 MG tablet Take 1 tablet (50 mg total) by mouth 2 (two) times daily.  60 tablet  1  . silver sulfADIAZINE (SILVADENE) 1 % cream Apply 1 application topically daily.  50 g  0   No current facility-administered medications on file prior to visit.    Allergies  Allergen Reactions  . Ramipril Other  (See Comments)    unknown    REVIEW OF SYSTEMS:  (Positives checked otherwise negative)  CARDIOVASCULAR:  []  chest pain, []  chest pressure, []  palpitations, []  shortness of breath when laying flat, []  shortness of breath with exertion,  []  pain in feet when walking, []  pain in feet when laying flat, []  history of blood clot in veins (DVT), []  history of phlebitis, []  swelling in legs, []  varicose veins  PULMONARY:  []  productive cough, []  asthma, []  wheezing  NEUROLOGIC:  []  weakness in arms or legs, []  numbness in arms or legs, []  difficulty speaking or slurred speech, []  temporary loss of vision in one eye, []  dizziness  HEMATOLOGIC:  []  bleeding problems, []  problems with blood clotting too easily  MUSCULOSKEL:  []  joint pain, []  joint swelling  GASTROINTEST:  []  vomiting blood, []  blood in stool     GENITOURINARY:  []  burning with urination, []  blood in urine  PSYCHIATRIC:  []  history of major depression  INTEGUMENTARY:  []  rashes, []  ulcers  CONSTITUTIONAL:  []  fever, []  chills  For VQI Use Only  PRE-ADM LIVING: Home  AMB STATUS: Ambulatory  CAD Sx: History of MI, but no symptoms No MI within 6 months  PRIOR CHF: None  STRESS TEST: [x]  No, [ ]  Normal, [ ]  + ischemia, [ ]  + MI, [ ]  Both  Physical Examination  Filed Vitals:   06/01/14 1538 06/01/14 1539  BP: 147/61 178/74  Pulse: 63 55  Resp: 18   Height: 5\' 10"  (1.778 m)   Weight: 193 lb (87.544 kg)    Body mass index is 27.69 kg/(m^2).  General: A&O x 3, WDWN  Head: Nicholas/AT  Ear/Nose/Throat: Hearing grossly intact, nares w/o erythema or drainage, oropharynx w/o Erythema/Exudate, Mallampati score: 3  Eyes: PERRLA, EOMI, post-surg changes to lenses  Neck: Supple, no nuchal rigidity or LAD  Pulmonary: Sym exp, good air movt, CTAB, no rales, rhonchi, & wheezing  Cardiac: RRR, Nl S1, S2, no Murmurs, rubs or gallops  Vascular: Vessel Right Left  Radial Palpable Palpable  Brachial Palpable Palpable    Carotid Palpable, without bruit Palpable, without bruit  Aorta  Not palpable N/A  Femoral Palpable Palpable  Popliteal Not palpable Not palpable  PT Not Palpable BKA  DP Not Palpable BKA   Gastrointestinal: soft, NTND, -G/R, -  HSM, - masses, - CVAT B  Musculoskeletal: M/S 5/5 throughout except L BKA, pt able to ambulated with L BKA prosthesis, Extremities without ischemic changes except L BKA Neurologic: CN 2-12 intact , Pain and light touch intact in extremities except L BKA, Motor exam as listed above  Psychiatric: Judgment intact, Mood & affect appropriate for pt's clinical situation  Dermatologic: See M/S exam for extremity exam, no rashes otherwise noted  Lymph : No Cervical, Axillary, or Inguinal lymphadenopathy   Non-Invasive Vascular Imaging  CAROTID DUPLEX (Date: 04/23/14):   Right =Severe stenosis of distal CCA with moderate plaque noted.  Because of this stenosis, cannot determine stenosis range of ICA, most likely in 40-59% range. Also ratio would be inaccurate.  Left = proximal to mid ICA stenosis in range of >80%, based on systolic velocity, and 60-79%, based on diastolic velocity.  Further evaluation of the carotid arteries with additional study including CT angiogram of neck or carotid angiography is recommended.  Antegrade vertebral flow.  CTA Neck (05/21/14)  Post right carotid endarterectomy.   71% diameter stenosis distal right common carotid artery.   High-grade stenosis proximal left internal carotid artery with 85 -90% diameter stenosis 1.2 cm above its origin.   Bilateral internal carotid artery cavernous segment calcified plaque with moderate narrowing.   Mild to moderate narrowing proximal right vertebral artery. Mild narrowing cervical segment right vertebral artery. Moderate to marked narrowing distal right vertebral artery.   Mild to slightly moderate narrowing proximal left vertebral artery. Mild narrowing cervical segment left vertebral artery.  Mild narrowing distal left vertebral artery.   Plaque with mild to slightly moderate narrowing proximal left common carotid artery. Mild plaque and mild narrowing/irregularity remainder of left common carotid artery.   Mild narrowing of the proximal aspect of subclavian artery bilaterally.   Mild to moderate mucosal thickening inferior aspect left maxillary sinus.   Cervical spondylotic changes most notable C5-6 and C6-7   1 cm left parotid lesion possibly intra parotid lymph node. Cannot exclude primary parotid mass.  Based on my review of this patient's CTA, pt has a restenosis in distal R CCA <80%.  On the left, the patient has a >80% stenosis.  A calcified tongue of the plaque appears to extend pass the angle of the mandible.  There are scattered plaques in the aortic arch.  Medical Decision Making  Todd Cook is a 73 y.o. male who presents with: asx recurrent R ICA stenosis <80%, asx L ICA stenosis 80%, history of NSTEMI s/p CABG   Pt's prior L BKA occurred in the setting of NSTEMI so cardiac risk stratification clearly should be obtained.    The extent of L ICA plaque in this patient unfortunately appears to extend higher than the segment of >80% stenosis.  The problem is that a L CEA would need to extend up into that distal segment due to calcification in the tongue of plaque.    I suspect this would require a mandibular subluxation or osteotomy to complete a L CEA.  Given the associated high cranial nerve complication rate, consideration for carotid stenting should be obtained.  I will have Dr. Darrick Penna and Dr. Myra Gianotti review the CTA to give an opinion.  In regards to the R ICA stenosis recurrence.  Due to cranial nerve concerns, frequently they are managed with CAS.  At this point, there is no indication for intervention with only 71% stenosis.  I discussed in depth with the patient the nature of atherosclerosis, and emphasized  the importance of maximal medical management  including strict control of blood pressure, blood glucose, and lipid levels, obtaining regular exercise, antiplatelet agents, and cessation of smoking.   The patient is currently on a statin: Zocor.  The patient is currently on an anti-platelet: ASA.  The patient is aware that without maximal medical management the underlying atherosclerotic disease process will progress, limiting the benefit of any interventions.  The patient is going to follow up with Dr. Darrick Penna after his cardiac work-up is completed.     Thank you for allowing [place] to participate in this patient's care.  Leonides Sake, MD [facility] Office: [phone] Pager: [phone]  06/01/2014, 4:32 PM

## 2014-06-11 NOTE — Op Note (Addendum)
OPERATIVE NOTE   PROCEDURE: 1.  Right common femoral artery cannulation under ultrasound guidance 2.  Placement of catheter in aorta 3.  Arch Aortogram 4.  Right common carotid artery selection 5.  Right carotid and cerebral angiogram 6.  Left carotid and cerebral angiogram  PRE-OPERATIVE DIAGNOSIS: recurrent right internal carotid stenosis and high grade left carotid stenosis  POST-OPERATIVE DIAGNOSIS: same as above   SURGEON: Leonides SakeBrian Chen, MD  ANESTHESIA: conscious sedation  ESTIMATED BLOOD LOSS: 50 cc  CONTRAST: 130 cc  FINDING(S):  Patent type I aortic arch  Patent innominate artery, left common carotid artery and left subclavian artery (<30% stenosis with calcification at orifice)  Right subclavian artery stenosis ~50%  Attenuated right vertebral artery  Patent right common carotid artery and external carotid artery  Right internal carotid artery with appearance consistent with prior endarterectomy with patch angioplasty  Right common carotid artery stenosis <50% with small diverticulum suggestive of penetrating ulcer  Left internal carotid artery with maximal stenosis of 44% by NASCET: disease appears to extend close to angle of mandible on left  Patent left external carotid artery with <30% stenosis  Bilateral cerebral angiogram will be read by neuroradiology  SPECIMEN(S):  none  INDICATIONS:   Todd Cook is a 73 y.o. male who presents with abnormal neck CTA.  The patient presents for: arch aortogram, bilateral carotid and cerebral angiogram.  I discussed with the patient the nature of angiographic procedures, especially the limited patencies of any endovascular intervention.  The patient is aware of that the risks of an angiographic procedure include but are not limited to: bleeding, infection, access site complications, renal failure, embolization, rupture of vessel, dissection, possible need for emergent surgical intervention, possible need for  surgical procedures to treat the patient's pathology, and stroke and death.  The patient is aware of the risks and agrees to proceed.  DESCRIPTION: After full informed consent was obtained from the patient, the patient was brought back to the angiography suite.  The patient was placed supine upon the angiography table and connected to monitoring equipment.  The patient was then given conscious sedation, the amounts of which are documented in the patient's chart.  The patient was prepped and drape in the standard fashion for an angiographic procedure.  At this point, attention was turned to the right groin.  Under ultrasound guidance, the right common femoral artery will be cannulated with a 18 gauge needle.  The [facility] was passed up into the aorta.  The needle was exchanged for a 5-Fr sheath, which was advanced over the wire into the common femoral artery.  The dilator was then removed.  The Omniflush catheter was then loaded over the wire up to the level of ascending aorta.  The catheter was connected to the power injector circuit.  After de-airring and de-clotting the circuit, a power injector arch aortogram was completed.  The findings are listed above.  The catheter was pulled down into the descending thoracic aorta where it was exchanged for a H1 catheter.  Using this catheter and a Benson wire, the right innominate artery was selected with difficulty.  The right common carotid artery was then selected.  The wire was removed.  The catheter was connected to the power injector.  A right carotid and cerebral angiogram was completed in anteroposterior and lateral projections.  The findings are listed above.  The wire was replaced in the catheter and both pulled down into the descending thoracic aorta.  The catheter was exchanged  for a BER-2 catheter.  This catheter and Benson wire were used to select the left common carotid artery.  The wire was removed.  The catheter was connected to the power injector.  A  left carotid and cerebral angiogram was completed in anteroposterior and lateral projections.  The findings are listed above.  The wire was replaced in the catheter and both were removed from the right femoral sheath.  The sheath was aspirated.  No clots were present and the sheath was reloaded with heparinized saline.    COMPLICATIONS: none  CONDITION: stable  Leonides Sake, MD [facility] Office: [phone] Pager: [phone]  06/11/2014, 11:52 AM

## 2014-06-11 NOTE — Telephone Encounter (Signed)
As per Joyce Gross, pt needs to keep appointment with [facility] on 08/27- so this follow up can be kept the same. dpm

## 2014-06-11 NOTE — Telephone Encounter (Signed)
Message copied by Fredrich Birks on Thu Jun 11, 2014  3:37 PM ------      Message from: Phillips Odor      Created: Thu Jun 11, 2014  1:00 PM      Regarding: Judeth Cornfield log; also needs 4 wk. f/u w/ [facility]                   ----- Message -----         From: Fransisco Hertz, MD         Sent: 06/11/2014  12:04 PM           To: [address]            CART[facility] [place]      [id number]      10/28/1941            PROCEDURE:      1.  Right common femoral artery cannulation under ultrasound guidance      2.  Placement of catheter in aorta      3.  Arch Aortogram      4.  Right common carotid artery selection      5.  Right carotid and cerebral angiogram      6.  Left carotid and cerebral angiogram            Follow-up: 4 weeks ------

## 2014-06-11 NOTE — Discharge Instructions (Signed)

## 2014-06-11 NOTE — Progress Notes (Signed)
Report to relief: Alfonso Ramus, RN

## 2014-06-11 NOTE — Interval H&P Note (Signed)
[facility]  History and Physical Update  The patient was interviewed and re-examined.  The patient's previous History and Physical has been reviewed and is unchanged from my consult.  After reviewing the CTA, Dr. Darrick Penna suggested carotid angiogram to determine the level of disease in this patient.  There is no change in the plan of care: Arch aortogram, bilateral carotid and cerebral angiogram.  Leonides Sake, MD [facility] Office: [phone] Pager: [phone]  06/11/2014, 8:20 AM

## 2014-06-11 NOTE — Progress Notes (Signed)
Site area: right groin Site Prior to Removal:  Level 0 Pressure Applied For: 20 minutes; sheath pulled by Greg Cutter, RN Manual:   yes Patient Status During Pull:  Stable Post Pull Site:  Level 0 Post Pull Instructions Given:  yes Post Pull Pulses Present: yes doppled Dressing Applied: Tegaderm Bedrest begins @ 12:35 Comments: No complications

## 2014-06-17 NOTE — Consult Note (Signed)
Todd Cook, Todd Cook NO.:  [id number]  MEDICAL RECORD NO.:  [id number]  LOCATION:  [facility]                         FACILITY:  [facility]  PHYSICIAN:  Shayden Gingrich K. Peachie Barkalow, M.D.DATE OF BIRTH:  Mar 26, 1941  DATE OF CONSULTATION: DATE OF DISCHARGE:  06/11/2014                                CONSULTATION   CLINICAL HISTORY:  Patient with progressive right carotid stenosis.  EXAMINATION:    Intracranial interpretation of bilateral common carotid arteriograms.  The right common carotid artery injection demonstrates the cervical petrous junction of the right internal carotid artery to be widely patent.  The petrous and proximal cavernous segment is normal.  There is a mild to modest stenosis of the proximal supraclinoid segment.  The right middle and the right anterior cerebral arteries grossly opacify normally into the capillary and the venous phases.  The left common carotid arteriogram demonstrates the petrous and the cervical portions of the left internal carotid artery to be widely patent.  There is  an approximately 50% narrowing of the junction of the supraclinoid and distal cavernous portions.  The supraclinoid segment distally is widely patent.  The left middle and the left anterior cerebral artery opacify normally into the capillary and the venous phases.  IMPRESSION:    Angiographically on these images, suggestion of  bilateral internal carotid artery stenosis in the cavernous segment, seemingly more severe of approximately 50% involving the left distal cavernous/supraclinoid segments.          ______________________________ Todd Cook. Todd Cook, M.D.     SKD/MEDQ  D:  06/16/2014  T:  06/16/2014  Job:  [id number]

## 2014-07-07 ENCOUNTER — Ambulatory Visit (INDEPENDENT_AMBULATORY_CARE_PROVIDER_SITE_OTHER): Payer: Commercial Managed Care - HMO | Admitting: Internal Medicine

## 2014-07-07 ENCOUNTER — Encounter: Payer: Self-pay | Admitting: Internal Medicine

## 2014-07-07 VITALS — BP 140/62 | HR 55 | Ht 70.0 in | Wt 193.0 lb

## 2014-07-07 DIAGNOSIS — Z01810 Encounter for preprocedural cardiovascular examination: Secondary | ICD-10-CM

## 2014-07-07 NOTE — Assessment & Plan Note (Signed)
Review of his history, physical exam, and possible carotid vascular surgery, suggest that the patient would be a suitable candidate for carotid vascular surgery if deemed necessary. He is pending followup vascular surgery evaluation. From my perspective, he would be low risk for surgery for major cardiovascular complications.

## 2014-07-07 NOTE — Patient Instructions (Signed)
Your physician wants you to follow-up in: 12 months with Dr. Taylor You will receive a reminder letter in the mail two months in advance. If you don't receive a letter, please call our office to schedule the follow-up appointment.  Your physician recommends that you continue on your current medications as directed. Please refer to the Current Medication list given to you today.     

## 2014-07-07 NOTE — Progress Notes (Signed)
HPI Mr. Torrence returns today for followup. He is a pleasant 73 yo man with a h/o PAF, HTN, and CAD. He is s/p CABG. The patient denies chest pain, sob, or peripheral edema. No syncope.  He has developed carotid vascular disease. There is a question of whether or not he needs carotid surgery. The patient otherwise feels well.  Allergies  Allergen Reactions  . Ramipril Anaphylaxis     Current Outpatient Prescriptions  Medication Sig Dispense Refill  . allopurinol (ZYLOPRIM) 300 MG tablet Take 300 mg by mouth daily.        Marland Kitchen aspirin EC 81 MG tablet Take 81 mg by mouth daily.        . clopidogrel (PLAVIX) 75 MG tablet Take 75 mg by mouth daily.       . colchicine 0.6 MG tablet Take 0.6 mg by mouth daily.        . ferrous sulfate (FERROUSUL) 325 (65 FE) MG tablet Take 325 mg by mouth daily with breakfast.        . glimepiride (AMARYL) 4 MG tablet Take 4 mg by mouth 2 (two) times daily.      Marland Kitchen losartan (COZAAR) 100 MG tablet Take 100 mg by mouth daily.        . metFORMIN (GLUCOPHAGE) 850 MG tablet Take 425 mg by mouth 2 (two) times daily.       . metoprolol (LOPRESSOR) 50 MG tablet Take 1 tablet (50 mg total) by mouth 2 (two) times daily.  60 tablet  1  . omeprazole (PRILOSEC) 20 MG capsule Take 20 mg by mouth daily.       . simvastatin (ZOCOR) 20 MG tablet Take 20 mg by mouth at bedtime.        . sitaGLIPtin (JANUVIA) 100 MG tablet Take 100 mg by mouth daily.         No current facility-administered medications for this visit.     Past Medical History  Diagnosis Date  . Chronic kidney disease   . Anemia   . Diabetes mellitus   . 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 Coronary artery disease     Status post CABG in 2000; status post NSTEMI 11/12 -LHC 09/20/11: LM 20%, pLAD 70% and mid occluded, CFX and RCA occluded, oL-LAD 60%, pSVG-RCA 80% and dist 40%, pSVG-OM2 30%, pSVG-OM3 20%.  PCI: Promus DES to [facility]   . Ischemic cardiomyopathy    Echocardiogram 09/20/11: Mid to distal anterior, septal and apical akinesis, mild AI, mild MR, mild LAE, EF 35%.  . Gout     left foot  . Carotid stenosis     Carotid Dopplers: RICA without significant stenosis; LICA 60-79%.  . Atrial fibrillation     Coumadin deferred due to the need for Plavix and aspirin post DES for NSTEMI/PCI 11/12  . HLD (hyperlipidemia)   . SOB (shortness of breath)   . MI (myocardial infarction)     ROS:   All systems reviewed and negative except as noted in the HPI.   Past Surgical History  Procedure Laterality Date  . Coronary artery bypass graft  10 years ago  . Knee arthroplasty      2000  . Cataract extraction, bilateral    . Eye surgery    . Cardiac catheterization    . Amputation  09/26/2011    Procedure: AMPUTATION BELOW KNEE;  Surgeon: Sherren Kerns, MD;  Location: [facility] OR;  Service: Vascular;  Laterality: Left;     History reviewed. No pertinent family history.   History   Social History  . Marital Status: Married    Spouse Name: N/A    Number of Children: N/A  . Years of Education: N/A   Occupational History  . Not on file.   Social History Main Topics  . Smoking status: Never Smoker   . Smokeless tobacco: Never Used  . Alcohol Use: No  . Drug Use: No  . Sexual Activity: No   Other Topics Concern  . Not on file   Social History Narrative  . No narrative on file     BP 140/62  Pulse 55  Ht 5\' 10"  (1.778 m)  Wt 193 lb (87.544 kg)  BMI 27.69 kg/m2  Physical Exam:  Well appearing 73 year old man, NAD HEENT: Unremarkable Neck:  No JVD, no thyromegally Lungs:  Clear with no wheezes HEART:  Regular rate rhythm, no murmurs, no rubs, no clicks Abd:  soft, positive bowel sounds, no organomegally, no rebound, no guarding Ext:  2 plus pulses, no edema, no cyanosis, no clubbing Skin:  No rashes no nodules Neuro:  CN II through XII intact, motor grossly intact   Assess/Plan:

## 2014-07-08 ENCOUNTER — Encounter: Payer: Self-pay | Admitting: Vascular Surgery

## 2014-07-09 ENCOUNTER — Encounter: Payer: Self-pay | Admitting: Vascular Surgery

## 2014-07-09 ENCOUNTER — Ambulatory Visit (INDEPENDENT_AMBULATORY_CARE_PROVIDER_SITE_OTHER): Payer: Commercial Managed Care - HMO | Admitting: Vascular Surgery

## 2014-07-09 ENCOUNTER — Other Ambulatory Visit: Payer: Self-pay | Admitting: *Deleted

## 2014-07-09 VITALS — BP 169/72 | HR 59 | Resp 16 | Ht 70.0 in | Wt 194.0 lb

## 2014-07-09 DIAGNOSIS — I6523 Occlusion and stenosis of bilateral carotid arteries: Secondary | ICD-10-CM

## 2014-07-09 DIAGNOSIS — I6529 Occlusion and stenosis of unspecified carotid artery: Secondary | ICD-10-CM

## 2014-07-09 DIAGNOSIS — I658 Occlusion and stenosis of other precerebral arteries: Secondary | ICD-10-CM

## 2014-07-09 NOTE — Progress Notes (Signed)
Patient is a 73 year old male returns for followup today. He previously underwent right carotid endarterectomy by Dr. Madilyn Fireman in 2000. He was recently seen by my partner Dr. Imogene Burn after a CT angiogram suggested high-grade stenosis. Recent arteriogram showed a 60% right internal carotid artery stenosis and a 40% left internal carotid artery stenosis. The patient denies any symptoms of TIA amaurosis or stroke. He is on aspirin. Of note he has had a previous below-knee amputation in 2012. He has no problems in the contralateral leg. He is able to walk on a prosthetic.  Review of systems: He denies shortness of breath. He denies chest pain.   Physical exam:  Filed Vitals:   07/09/14 1313  BP: 169/72  Pulse: 59  Resp: 16  Height: 5\' 10"  (1.778 m)  Weight: 194 lb (87.998 kg)    Neck: Right-sided carotid bruit Neuro: Symmetric upper extremity and lower extremity motor strength 5 over 5  Data: Arteriogram was reviewed today 60-65% right internal carotid artery stenosis 40% left internal carotid artery stenosis  Assessment: Moderate recurrent stenosis right side asymptomatic minimal stenosis left side asymptomatic   Plan: Continue antiplatelet agent followup carotid duplex scan in 6 months or sooner if develops symptoms  Fabienne Bruns, MD [facility] [place] Office: [phone] Pager: [phone]

## 2014-07-28 ENCOUNTER — Ambulatory Visit (INDEPENDENT_AMBULATORY_CARE_PROVIDER_SITE_OTHER): Payer: Commercial Managed Care - HMO

## 2014-07-28 DIAGNOSIS — I739 Peripheral vascular disease, unspecified: Secondary | ICD-10-CM

## 2014-07-28 DIAGNOSIS — E1140 Type 2 diabetes mellitus with diabetic neuropathy, unspecified: Secondary | ICD-10-CM

## 2014-07-28 DIAGNOSIS — E1149 Type 2 diabetes mellitus with other diabetic neurological complication: Secondary | ICD-10-CM

## 2014-07-28 DIAGNOSIS — B351 Tinea unguium: Secondary | ICD-10-CM

## 2014-07-28 DIAGNOSIS — M79609 Pain in unspecified limb: Secondary | ICD-10-CM

## 2014-07-28 DIAGNOSIS — M79676 Pain in unspecified toe(s): Secondary | ICD-10-CM

## 2014-07-28 NOTE — Progress Notes (Signed)
   Subjective:    Patient ID: Todd Cook, male    DOB: 06-05-1941, 73 y.o.   MRN: [id number]  HPI Comments: Pt presents to have toenails trimmed, right 1-5 toes.     Review of Systems no new findings or systemic changes noted     Objective:   Physical Exam 73 year old white male presents at this time for followup of ulcer which is resolved as well as mycotic nail care debridement of nails. 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 Right foot demonstrates thick and brittle crumbly dystrophic nails hallux and fourth digit right with discoloration and friability remained nails are more normal trophic. Patient does have decreased epicritic sensation is on [facility] testing to forefoot digits nails thick brittle criptotic incurvated and friable consistent with early onychomycosis no open wounds no ulcer no secondary infections pedal pulses DP and PT DP plus one over 4 PT thready at plus one over 4 possible nonpalpable absent hair growth diminished skin texture and turgor. No current open wounds or ulcers are identified on the right foot.       Assessment & Plan:  Assessment this time is diabetes with history peripheral neuropathy amputation left leg at this time dystrophic friable mycotic nails 1 and 4 right are debrided at the topical antifungal such as Fungi-Nail apply daily to the affected nails for 6-12 months duration reappointed future for palliative care is needed patient is been able soft right nails at times however if not able to suggest a 3-4 month followup for palliative care is needed in the future. Recommended topical antifungal therapy for at least a 6-12 months duration.  Alvan Dame DPM

## 2014-07-28 NOTE — Patient Instructions (Signed)
Onychomycosis/Fungal Toenails  WHAT IS IT? An infection that lies within the keratin of your nail plate that is caused by a fungus.  WHY ME? Fungal infections affect all ages, sexes, races, and creeds.  There may be many factors that predispose you to a fungal infection such as age, coexisting medical conditions such as diabetes, or an autoimmune disease; stress, medications, fatigue, genetics, etc.  Bottom line: fungus thrives in a warm, moist environment and your shoes offer such a location.  IS IT CONTAGIOUS? Theoretically, yes.  You do not want to share shoes, nail clippers or files with someone who has fungal toenails.  Walking around barefoot in the same room or sleeping in the same bed is unlikely to transfer the organism.  It is important to realize, however, that fungus can spread easily from one nail to the next on the same foot.  HOW DO WE TREAT THIS?  There are several ways to treat this condition.  Treatment may depend on many factors such as age, medications, pregnancy, liver and kidney conditions, etc.  It is best to ask your doctor which options are available to you.  1. No treatment.   Unlike many other medical concerns, you can live with this condition.  However for many people this can be a painful condition and may lead to ingrown toenails or a bacterial infection.  It is recommended that you keep the nails cut short to help reduce the amount of fungal nail. 2. Topical treatment.  These range from herbal remedies to prescription strength nail lacquers.  About 40-50% effective, topicals require twice daily application for approximately 9 to 12 months or until an entirely new nail has grown out.  The most effective topicals are medical grade medications available through physicians offices. 3. Oral antifungal medications.  With an 80-90% cure rate, the most common oral medication requires 3 to 4 months of therapy and stays in your system for a year as the new nail grows out.  Oral  antifungal medications do require blood work to make sure it is a safe drug for you.  A liver function panel will be performed prior to starting the medication and after the first month of treatment.  It is important to have the blood work performed to avoid any harmful side effects.  In general, this medication safe but blood work is required. 4. Laser Therapy.  This treatment is performed by applying a specialized laser to the affected nail plate.  This therapy is noninvasive, fast, and non-painful.  It is not covered by insurance and is therefore, out of pocket.  The results have been very good with a 80-95% cure rate.  The [facility] is the only practice in the area to offer this therapy. 5. Permanent Nail Avulsion.  Removing the entire nail so that a new nail will not grow back.   Recommendations for treatment at home: Obtain Fungi-Nail at any pharmacy without or prescription. Apply once or twice daily to each affected nail for 6-12 months as instructed

## 2014-08-18 ENCOUNTER — Ambulatory Visit: Payer: Commercial Managed Care - HMO

## 2014-08-18 NOTE — Progress Notes (Signed)
   Subjective:    Patient ID: Todd Cook, male    DOB: Feb 27, 1941, 73 y.o.   MRN: [id number]  HPI Comments: Pt presented for diabetic shoe pick-up.  Pt's custom molded inserts were not in, but the shoes were.  I explained to the pt that Medicare would not cover the shoes if the inserts were not dispensed at the same time.  I told the pt we would call him when the inserts arrived and work him into Dr. Dionne Bucy Tuesday schedule at his convenience.     Review of Systems     Objective:   Physical Exam        Assessment & Plan:

## 2014-09-15 ENCOUNTER — Ambulatory Visit (INDEPENDENT_AMBULATORY_CARE_PROVIDER_SITE_OTHER): Payer: Commercial Managed Care - HMO

## 2014-09-15 VITALS — BP 169/68 | HR 51 | Resp 15

## 2014-09-15 DIAGNOSIS — Z89512 Acquired absence of left leg below knee: Secondary | ICD-10-CM

## 2014-09-15 DIAGNOSIS — I739 Peripheral vascular disease, unspecified: Secondary | ICD-10-CM

## 2014-09-15 DIAGNOSIS — M79676 Pain in unspecified toe(s): Secondary | ICD-10-CM

## 2014-09-15 DIAGNOSIS — M2041 Other hammer toe(s) (acquired), right foot: Secondary | ICD-10-CM

## 2014-09-15 DIAGNOSIS — E1140 Type 2 diabetes mellitus with diabetic neuropathy, unspecified: Secondary | ICD-10-CM

## 2014-09-15 DIAGNOSIS — M2031 Hallux varus (acquired), right foot: Secondary | ICD-10-CM

## 2014-09-15 DIAGNOSIS — B351 Tinea unguium: Secondary | ICD-10-CM

## 2014-09-15 DIAGNOSIS — Z89522 Acquired absence of left knee: Secondary | ICD-10-CM

## 2014-09-15 NOTE — Patient Instructions (Signed)

## 2014-09-15 NOTE — Progress Notes (Signed)
   Subjective:    Patient ID: Todd Cook, male    DOB: [birth year], 73 y.o.   MRN: [id number]  HPI Comments: Pt preswents for diabetic shoe pick-up.  Oral and printed diabetic wearing instructions were given.  Pt was fitted with [employer] 18800 GIL 11 wide, and custom molded inserts, and pt states the shoe feels fine on his right foot.     Review of Systemsno new findings or systemic changes noted     Objective:   Physical Exam Patient presents this time to pick up diabetic extra shoes and custom molded inlays patient is BK amputee on the left side 1 pair of shoes dispensed a fit and contour well to the right foot they work with the prosthesis on the left foot to maintain equal height. The 3 pairs of custom molded dual density Plastizote inlays are dispensed for the right foot the inlays fit and contour with full contact of the foot and arch on the right side. 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 Skin temperature warm turgor diminished minimal edema noted      Assessment & Plan:  Assessment diabetes with history of Locations neuropathy and angiopathy peripheral vascular compromise and history of amputation digital contractures noted at this time accommodative shoes one pair dispensed a fit and contour well to the foot and to work with his prosthesis on the left side and 3 pairs of custom molded inlays are dispensed a fit and contour well recheck in 3-6 months for future palliative care is needed next progress Alvan Dame DPM

## 2014-10-21 ENCOUNTER — Encounter (HOSPITAL_COMMUNITY): Payer: Self-pay | Admitting: Cardiovascular Disease

## 2014-10-22 ENCOUNTER — Encounter (HOSPITAL_COMMUNITY): Payer: Self-pay | Admitting: Cardiovascular Disease

## 2015-01-13 ENCOUNTER — Encounter: Payer: Self-pay | Admitting: Vascular Surgery

## 2015-01-14 ENCOUNTER — Encounter: Payer: Self-pay | Admitting: Vascular Surgery

## 2015-01-14 ENCOUNTER — Ambulatory Visit (HOSPITAL_COMMUNITY)
Admission: RE | Admit: 2015-01-14 | Discharge: 2015-01-14 | Disposition: A | Discharge status: Home / Self Care | Payer: Commercial Managed Care - HMO | Source: Ambulatory Visit | Attending: Vascular Surgery | Admitting: Vascular Surgery

## 2015-01-14 ENCOUNTER — Ambulatory Visit (INDEPENDENT_AMBULATORY_CARE_PROVIDER_SITE_OTHER): Payer: Commercial Managed Care - HMO | Admitting: Vascular Surgery

## 2015-01-14 VITALS — BP 162/68 | HR 93 | Resp 18 | Ht 70.0 in | Wt 189.0 lb

## 2015-01-14 DIAGNOSIS — I6523 Occlusion and stenosis of bilateral carotid arteries: Secondary | ICD-10-CM

## 2015-01-14 NOTE — Addendum Note (Signed)
Addended by: Sharee Pimple on: 01/14/2015 05:12 PM   Modules accepted: Orders

## 2015-01-14 NOTE — Progress Notes (Signed)
Patient is a 74 year old male returns for followup today. He previously underwent right carotid endarterectomy by Dr. Madilyn Fireman in 2000. He was recently seen by my partner Dr. Imogene Burn after a CT angiogram suggested high-grade stenosis. Recent arteriogram July 2015 showed a 60% right internal carotid artery stenosis and a 40% left internal carotid artery stenosis. The patient denies any symptoms of TIA amaurosis or stroke. He is on aspirin. Of note he has had a previous below-knee amputation in 2012. He has no problems in the contralateral leg. He is able to walk on a prosthetic.  Review of systems: He denies shortness of breath. He denies chest pain.   Physical exam:  Filed Vitals:   01/14/15 1352 01/14/15 1354  BP: 168/55 162/68  Pulse: 86 93  Resp: 18   Height: 5\' 10"  (1.778 m)   Weight: 189 lb (85.73 kg)        Neck: Right-sided carotid bruit Neuro: Symmetric upper extremity and lower extremity motor strength 5 over 5  Data: Carotid duplex scan was performed today. I reviewed and interpreted this study. 60-80% left internal carotid artery stenosis, 50% right distal common carotid stenosis.  This is essentially unchanged from his previous study.  Assessment: Moderate recurrent stenosis right side asymptomatic minimal stenosis left side asymptomatic   Plan: Continue antiplatelet agent followup carotid duplex scan in 6 months or sooner if develops symptoms  Fabienne Bruns, MD [facility] [place] Office: [phone] Pager: [phone]

## 2015-07-21 ENCOUNTER — Encounter: Payer: Self-pay | Admitting: Vascular Surgery

## 2015-07-22 ENCOUNTER — Encounter: Payer: Self-pay | Admitting: Vascular Surgery

## 2015-07-22 ENCOUNTER — Ambulatory Visit (INDEPENDENT_AMBULATORY_CARE_PROVIDER_SITE_OTHER): Payer: Commercial Managed Care - HMO | Admitting: Vascular Surgery

## 2015-07-22 ENCOUNTER — Ambulatory Visit (HOSPITAL_COMMUNITY)
Admission: RE | Admit: 2015-07-22 | Discharge: 2015-07-22 | Disposition: A | Discharge status: Home / Self Care | Payer: Commercial Managed Care - HMO | Source: Ambulatory Visit | Attending: Vascular Surgery | Admitting: Vascular Surgery

## 2015-07-22 VITALS — BP 161/48 | HR 54 | Temp 98.2°F | Resp 16 | Ht 70.0 in | Wt 187.0 lb

## 2015-07-22 DIAGNOSIS — I6523 Occlusion and stenosis of bilateral carotid arteries: Secondary | ICD-10-CM | POA: Insufficient documentation

## 2015-07-22 DIAGNOSIS — E1122 Type 2 diabetes mellitus with diabetic chronic kidney disease: Secondary | ICD-10-CM | POA: Insufficient documentation

## 2015-07-22 DIAGNOSIS — N189 Chronic kidney disease, unspecified: Secondary | ICD-10-CM | POA: Insufficient documentation

## 2015-07-22 DIAGNOSIS — I6521 Occlusion and stenosis of right carotid artery: Secondary | ICD-10-CM | POA: Diagnosis not present

## 2015-07-22 DIAGNOSIS — I129 Hypertensive chronic kidney disease with stage 1 through stage 4 chronic kidney disease, or unspecified chronic kidney disease: Secondary | ICD-10-CM | POA: Diagnosis not present

## 2015-07-22 NOTE — Progress Notes (Signed)
    History of Present Illness  Todd Cook is a 74 y.o. (01-Aug-1941) male who presents for follow-up of his carotid artery stenosis. He is s/p right carotid endarterectomy by Dr. Madilyn Fireman in 2000. He was seen by Dr. Imogene Burn last year after a CTA  suggested a high-grade stenosis. He then underwent an arteriogram in July 2015 that demonstrated a 60% right ICA stenosis and 40% left stenosis.    Patient denies any history of TIA or stroke symptom.  The patient has not had amaurosis fugax or monocular blindness.  The patient has never had facial drooping or hemiplegia.  The patient has not had receptive or expressive aphasia.    The patient's PMH, PSH, SH, FamHx, Med, and Allergies are unchanged from previous visit six months ago.  He is on maximal medical management with a statin, aspirin and plavix. He is not a smoker.   Physical Examination  Filed Vitals:   07/22/15 1542 07/22/15 1545 07/22/15 1554 07/22/15 1557  BP: 149/116 164/55 146/52 161/48  Pulse: 64 56 53 54  Temp:  98.2 F (36.8 C)    TempSrc:  Oral    Resp:  16    Height:  5\' 10"  (1.778 m)    Weight:  187 lb (84.823 kg)    SpO2:  100%     Body mass index is 26.83 kg/(m^2).  General: A&O x 3, WDWN male in NAD  Neck: Supple  Pulmonary: Sym exp, good air movt, CTAB, no rales, rhonchi, & wheezing  Cardiac: RRR, Nl S1, S2, no Murmurs, rubs or gallops, right carotid bruit  Vascular: radial pulses 2+ and symmetric. 2+ right femoral pulse. Weakly palpable left femoral pulse. Left BKA  Musculoskeletal: M/S 5/5 throughout.   Neurologic: CN 2-12 grossly intact. No focal deficits.   Non-Invasive Vascular Imaging  CAROTID DUPLEX (Date: 07/22/15):   R ICA: 1-39%; >50% right distal common carotid artery stenosis with irregular plaque formation with slight mobility.   R VA:  patent and antegrade  L ICA stenosis: high end 60-79% stenosis left proximal   L VA:  patent and antegrade  Medical Decision Making  Todd Cook is a 74 y.o. male who presents with: asymptomatic bilateral carotid stenosis s/p right CEA (2000)   His duplex exam revealed an irregular right plaque formation with slight mobility. Dr. Darrick Penna reviewed his carotid arteriogram from July 2015 and felt it was not necessary to repeat an arteriogram.   He is on maximal medical management with aspirin, plavix and a statin. He is not a smoker.  Follow up in 6 months with repeat carotid duplex. The patient knows to follow up sooner if he develops any TIA or stroke symptoms.   Maris Berger, PA-C [facility] Office: [phone] Pager: [phone]  07/22/2015, 4:24 PM  This patient was seen in conjunction with Dr. Darrick Penna.   Patient's arteriogram from 11 months ago was reviewed. There is no significant mobile plaque at that time there was some narrowing of the proximal area the patch from his previous carotid endarterectomy. The patient remains asymptomatic. We will follow-up with him with a repeat duplex in 6 months time or sooner if he develops any symptoms. If he does develop symptoms or has progression of stenosis we will again repeat the arteriogram.  Fabienne Bruns, MD [facility] Office: [phone] Pager: [phone]

## 2015-07-22 NOTE — Progress Notes (Signed)
Filed Vitals:   07/22/15 1542 07/22/15 1545 07/22/15 1554 07/22/15 1557  BP: 149/116 164/55 146/52 161/48  Pulse: 64 56 53 54  Temp:  98.2 F (36.8 C)    TempSrc:  Oral    Resp:  16    Height:  5\' 10"  (1.778 m)    Weight:  187 lb (84.823 kg)    SpO2:  100%

## 2016-01-17 ENCOUNTER — Encounter: Payer: Self-pay | Admitting: Vascular Surgery

## 2016-01-19 ENCOUNTER — Other Ambulatory Visit: Payer: Self-pay | Admitting: *Deleted

## 2016-01-19 DIAGNOSIS — I6523 Occlusion and stenosis of bilateral carotid arteries: Secondary | ICD-10-CM

## 2016-01-20 ENCOUNTER — Encounter: Payer: Self-pay | Admitting: Vascular Surgery

## 2016-01-20 ENCOUNTER — Other Ambulatory Visit: Payer: Self-pay | Admitting: *Deleted

## 2016-01-20 ENCOUNTER — Ambulatory Visit (INDEPENDENT_AMBULATORY_CARE_PROVIDER_SITE_OTHER): Payer: Commercial Managed Care - HMO | Admitting: Vascular Surgery

## 2016-01-20 ENCOUNTER — Ambulatory Visit (HOSPITAL_COMMUNITY)
Admission: RE | Admit: 2016-01-20 | Discharge: 2016-01-20 | Disposition: A | Discharge status: Home / Self Care | Payer: Commercial Managed Care - HMO | Source: Ambulatory Visit | Attending: Vascular Surgery | Admitting: Vascular Surgery

## 2016-01-20 VITALS — BP 156/64 | HR 57 | Ht 70.0 in | Wt 184.0 lb

## 2016-01-20 DIAGNOSIS — I6523 Occlusion and stenosis of bilateral carotid arteries: Secondary | ICD-10-CM | POA: Insufficient documentation

## 2016-01-20 NOTE — Progress Notes (Signed)
[facility]  Patient name: Todd Cook MRN: [id number] DOB: 09/18/1941 Sex: male  REASON FOR VISIT: Follow-up  HPI: Todd Cook is a 75 y.o. male who presents for routine carotid follow-up. He previously had a right carotid endarterectomy by Dr. Madilyn Fireman years ago. He was last seen by Dr. Darrick Penna 6 months ago. His duplex exam at that time revealed an irregular right plaque formation. Dr. Darrick Penna had reviewed his carotid arteriogram from July 2015 and felt that it was not necessary to repeat the arteriogram. The patient was asymptomatic.  The patient denies any amaurosis fugax, sudden onset weakness or numbness, expressive or receptive aphasia. He takes an aspirin, Plavix, and statin daily.  He reports that he has been walking okay with his left prosthesis.  Past Medical History  Diagnosis Date  . Chronic kidney disease   . Anemia   . Diabetes mellitus   . 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 Coronary artery disease     Status post CABG in 2000; status post NSTEMI 11/12 -LHC 09/20/11: LM 20%, pLAD 70% and mid occluded, CFX and RCA occluded, oL-LAD 60%, pSVG-RCA 80% and dist 40%, pSVG-OM2 30%, pSVG-OM3 20%.  PCI: Promus DES to [facility]   . Ischemic cardiomyopathy     Echocardiogram 09/20/11: Mid to distal anterior, septal and apical akinesis, mild AI, mild MR, mild LAE, EF 35%.  . Gout     left foot  . Carotid stenosis     Carotid Dopplers: RICA without significant stenosis; LICA 60-79%.  . Atrial fibrillation (HCC)     Coumadin deferred due to the need for Plavix and aspirin post DES for NSTEMI/PCI 11/12  . HLD (hyperlipidemia)   . SOB (shortness of breath)   . MI (myocardial infarction) (HCC)     Family History  Problem Relation Age of Onset  . Heart disease Father     before age 46    SOCIAL HISTORY: Social History  Substance Use Topics  . Smoking status: Never Smoker   . Smokeless  tobacco: Never Used  . Alcohol Use: No    Allergies  Allergen Reactions  . Ramipril Anaphylaxis    Current Outpatient Prescriptions  Medication Sig Dispense Refill  . allopurinol (ZYLOPRIM) 300 MG tablet Take 300 mg by mouth daily.      Marland Kitchen aspirin EC 81 MG tablet Take 81 mg by mouth daily.      . clopidogrel (PLAVIX) 75 MG tablet Take 75 mg by mouth daily.     . colchicine 0.6 MG tablet Take 0.6 mg by mouth daily. COLCRYS    . ferrous sulfate (FERROUSUL) 325 (65 FE) MG tablet Take 325 mg by mouth daily with breakfast.      . glimepiride (AMARYL) 4 MG tablet Take 4 mg by mouth 2 (two) times daily.    Marland Kitchen losartan (COZAAR) 100 MG tablet Take 100 mg by mouth daily.      . metFORMIN (GLUCOPHAGE) 850 MG tablet Take 425 mg by mouth 2 (two) times daily.     . metoprolol (LOPRESSOR) 50 MG tablet 2 (two) times daily.    Marland Kitchen omeprazole (PRILOSEC) 20 MG capsule Take 20 mg by mouth daily.     . simvastatin (ZOCOR) 20 MG tablet Take 20 mg by mouth at bedtime.      . sitaGLIPtin (JANUVIA) 100 MG tablet Take 100 mg by mouth daily.      Marland Kitchen  metoprolol (LOPRESSOR) 50 MG tablet Take 1 tablet (50 mg total) by mouth 2 (two) times daily. 60 tablet 1   No current facility-administered medications for this visit.    REVIEW OF SYSTEMS:   denotes positive finding,  denotes negative finding Cardiac  Comments:  Chest pain or chest pressure:    Shortness of breath upon exertion:    Short of breath when lying flat:    Irregular heart rhythm:        Vascular    Pain in calf, thigh, or hip brought on by ambulation:    Pain in feet at night that wakes you up from your sleep:     Blood clot in your veins:    Leg swelling:         Pulmonary    Oxygen at home:    Productive cough:     Wheezing:         Neurologic    Sudden weakness in arms or legs:     Sudden numbness in arms or legs:     Sudden onset of difficulty speaking or slurred speech:    Temporary loss of vision in one eye:     Problems with  dizziness:         Gastrointestinal    Blood in stool:     Vomited blood:         Genitourinary    Burning when urinating:     Blood in urine:        Psychiatric    Major depression:         Hematologic    Bleeding problems:    Problems with blood clotting too easily:        Skin    Rashes or ulcers:        Constitutional    Fever or chills:      PHYSICAL EXAM: Filed Vitals:   01/20/16 1504 01/20/16 1505  BP: 152/67 156/64  Pulse: 62 57  Height:  (1.778 m)   Weight: 184 lb (83.462 kg)   SpO2: 100%     GENERAL: The patient is a well-nourished male, in no acute distress. The vital signs are documented above. VASCULAR: Radial pulses palpable.  PULMONARY: Nonlabored respiratory effort. MUSCULOSKELETAL: Left BKA with prosthesis. NEUROLOGIC: No focal weakness or paresthesias are detected. SKIN: There are no ulcers or rashes noted. PSYCHIATRIC: The patient has a normal affect.  DATA:  Carotid duplex 01/20/2016  Less than 40% stenosis right internal carotid artery. Greater then 50% stenosis in the distal right common carotid artery with an irregular plaque formation which demonstrates slight mobility. This is unchanged from carotid duplex 6 months ago.  60-79% stenosis left carotid artery.  Vertebral arteries patent bilaterally with antegrade flow.  MEDICAL ISSUES: Asymptomatic bilateral carotid stenosis s/p right CEA (2000)  The patient's carotid duplex studies are essentially unchanged from 6 months ago. He still has an irregular plaque formation at the distal common carotid artery with slight mobility. He has 60-79% stenosis of the left internal carotid artery. He has remained asymptomatic without any TIA or stroke symptoms. He is on maximal medical management with aspirin, Plavix and a statin. He will follow up in 6 months with repeat carotid duplex.  Maris Berger, PA-C [facility] [place]    History and exam details as above.  Patient has asymptomatic moderate recurrent right internal carotid artery stenosis and moderate left internal carotid artery stenosis. Continue his antiplatelet agent. He'll follow-up carotid  duplex exam in 6 months.  Fabienne Bruns, MD [facility] [place] Office: [phone] Pager: [phone]

## 2016-01-30 ENCOUNTER — Emergency Department (HOSPITAL_COMMUNITY): Payer: Commercial Managed Care - HMO

## 2016-01-30 ENCOUNTER — Encounter (HOSPITAL_COMMUNITY): Payer: Self-pay | Admitting: *Deleted

## 2016-01-30 ENCOUNTER — Emergency Department (HOSPITAL_COMMUNITY)
Admission: EM | Admit: 2016-01-30 | Discharge: 2016-01-30 | Disposition: A | Discharge status: Home / Self Care | Payer: Commercial Managed Care - HMO | Attending: Emergency Medicine | Admitting: Emergency Medicine

## 2016-01-30 DIAGNOSIS — Y9389 Activity, other specified: Secondary | ICD-10-CM | POA: Diagnosis not present

## 2016-01-30 DIAGNOSIS — M109 Gout, unspecified: Secondary | ICD-10-CM | POA: Diagnosis not present

## 2016-01-30 DIAGNOSIS — E119 Type 2 diabetes mellitus without complications: Secondary | ICD-10-CM | POA: Diagnosis not present

## 2016-01-30 DIAGNOSIS — Z79899 Other long term (current) drug therapy: Secondary | ICD-10-CM | POA: Diagnosis not present

## 2016-01-30 DIAGNOSIS — I129 Hypertensive chronic kidney disease with stage 1 through stage 4 chronic kidney disease, or unspecified chronic kidney disease: Secondary | ICD-10-CM | POA: Diagnosis not present

## 2016-01-30 DIAGNOSIS — I2510 Atherosclerotic heart disease of native coronary artery without angina pectoris: Secondary | ICD-10-CM | POA: Diagnosis not present

## 2016-01-30 DIAGNOSIS — I252 Old myocardial infarction: Secondary | ICD-10-CM | POA: Diagnosis not present

## 2016-01-30 DIAGNOSIS — Z7902 Long term (current) use of antithrombotics/antiplatelets: Secondary | ICD-10-CM | POA: Diagnosis not present

## 2016-01-30 DIAGNOSIS — Y9289 Other specified places as the place of occurrence of the external cause: Secondary | ICD-10-CM | POA: Insufficient documentation

## 2016-01-30 DIAGNOSIS — S0091XA Abrasion of unspecified part of head, initial encounter: Secondary | ICD-10-CM

## 2016-01-30 DIAGNOSIS — S0990XA Unspecified injury of head, initial encounter: Secondary | ICD-10-CM | POA: Diagnosis not present

## 2016-01-30 DIAGNOSIS — S0081XA Abrasion of other part of head, initial encounter: Secondary | ICD-10-CM | POA: Insufficient documentation

## 2016-01-30 DIAGNOSIS — Z951 Presence of aortocoronary bypass graft: Secondary | ICD-10-CM | POA: Diagnosis not present

## 2016-01-30 DIAGNOSIS — N189 Chronic kidney disease, unspecified: Secondary | ICD-10-CM | POA: Diagnosis not present

## 2016-01-30 DIAGNOSIS — E785 Hyperlipidemia, unspecified: Secondary | ICD-10-CM | POA: Insufficient documentation

## 2016-01-30 DIAGNOSIS — Z9889 Other specified postprocedural states: Secondary | ICD-10-CM | POA: Diagnosis not present

## 2016-01-30 DIAGNOSIS — Y998 Other external cause status: Secondary | ICD-10-CM | POA: Diagnosis not present

## 2016-01-30 DIAGNOSIS — Z89512 Acquired absence of left leg below knee: Secondary | ICD-10-CM | POA: Diagnosis not present

## 2016-01-30 DIAGNOSIS — W01198A Fall on same level from slipping, tripping and stumbling with subsequent striking against other object, initial encounter: Secondary | ICD-10-CM | POA: Diagnosis not present

## 2016-01-30 DIAGNOSIS — S0031XA Abrasion of nose, initial encounter: Secondary | ICD-10-CM | POA: Insufficient documentation

## 2016-01-30 DIAGNOSIS — D649 Anemia, unspecified: Secondary | ICD-10-CM | POA: Diagnosis not present

## 2016-01-30 DIAGNOSIS — S61411A Laceration without foreign body of right hand, initial encounter: Secondary | ICD-10-CM | POA: Insufficient documentation

## 2016-01-30 DIAGNOSIS — W19XXXA Unspecified fall, initial encounter: Secondary | ICD-10-CM

## 2016-01-30 DIAGNOSIS — Z7984 Long term (current) use of oral hypoglycemic drugs: Secondary | ICD-10-CM | POA: Diagnosis not present

## 2016-01-30 DIAGNOSIS — Z23 Encounter for immunization: Secondary | ICD-10-CM | POA: Insufficient documentation

## 2016-01-30 DIAGNOSIS — I4891 Unspecified atrial fibrillation: Secondary | ICD-10-CM | POA: Insufficient documentation

## 2016-01-30 DIAGNOSIS — Z7982 Long term (current) use of aspirin: Secondary | ICD-10-CM | POA: Diagnosis not present

## 2016-01-30 LAB — BASIC METABOLIC PANEL
Anion gap: 11 (ref 5–15)
BUN: 18 mg/dL (ref 6–20)
CALCIUM: 9.2 mg/dL (ref 8.9–10.3)
CO2: 20 mmol/L — ABNORMAL LOW (ref 22–32)
CREATININE: 1.5 mg/dL — AB (ref 0.61–1.24)
Chloride: 107 mmol/L (ref 101–111)
GFR, EST AFRICAN AMERICAN: 50 mL/min — AB (ref >60)
GFR, EST NON AFRICAN AMERICAN: 43 mL/min — AB (ref >60)
Glucose, Bld: 199 mg/dL — ABNORMAL HIGH (ref 65–99)
Potassium: 4.9 mmol/L (ref 3.5–5.1)
SODIUM: 138 mmol/L (ref 135–145)

## 2016-01-30 LAB — CBC WITH DIFFERENTIAL/PLATELET
BASOS PCT: 1 %
Basophils Absolute: 0.1 10*3/uL (ref 0.0–0.1)
EOS ABS: 0.8 10*3/uL — AB (ref 0.0–0.7)
EOS PCT: 10 %
HCT: 37.5 % — ABNORMAL LOW (ref 39.0–52.0)
Hemoglobin: 12 g/dL — ABNORMAL LOW (ref 13.0–17.0)
LYMPHS ABS: 1.5 10*3/uL (ref 0.7–4.0)
Lymphocytes Relative: 18 %
MCH: 29.1 pg (ref 26.0–34.0)
MCHC: 32 g/dL (ref 30.0–36.0)
MCV: 90.8 fL (ref 78.0–100.0)
MONOS PCT: 4 %
Monocytes Absolute: 0.4 10*3/uL (ref 0.1–1.0)
Neutro Abs: 5.6 10*3/uL (ref 1.7–7.7)
Neutrophils Relative %: 67 %
PLATELETS: 130 10*3/uL — AB (ref 150–400)
RBC: 4.13 MIL/uL — AB (ref 4.22–5.81)
RDW: 14.9 % (ref 11.5–15.5)
WBC: 8.4 10*3/uL (ref 4.0–10.5)

## 2016-01-30 MED ORDER — TETANUS-DIPHTH-ACELL PERTUSSIS 5-2.5-18.5 LF-MCG/0.5 IM SUSP
0.5000 mL | Freq: Once | INTRAMUSCULAR | Status: AC
  Administered 2016-01-30: 0.5 mL via INTRAMUSCULAR
  Filled 2016-01-30: qty 0.5

## 2016-01-30 NOTE — ED Notes (Signed)
Patient transported to CT 

## 2016-01-30 NOTE — ED Notes (Signed)
Pt was helping another church goer along sidewalk, tripped over his own feet.  Pt on Plavix.  No loc, recalls all events. CBG 285.  VS 190/86, hr 60, rr 18.  Abrasions noted.  AO x 4.

## 2016-01-30 NOTE — Discharge Instructions (Signed)
Follow up with your PCP as needed. Return to ED with new or worsening symptoms including but not limited to headache, dizziness, passing out, vision changes, nausea, vomiting, weakness of the extremities, numbness of the extremities, facial droop or speech slur.    Wound Care Taking care of your wound properly can help to prevent pain and infection. It can also help your wound to heal more quickly.  HOW TO CARE FOR YOUR WOUND  Take or apply over-the-counter and prescription medicines only as told by your health care provider.  If you were prescribed antibiotic medicine, take or apply it as told by your health care provider. Do not stop using the antibiotic even if your condition improves.  Clean the wound each day or as told by your health care provider.  Wash the wound with mild soap and water.  Rinse the wound with water to remove all soap.  Pat the wound dry with a clean towel. Do not rub it.  There are many different ways to close and cover a wound. For example, a wound can be covered with stitches (sutures), skin glue, or adhesive strips. Follow instructions from your health care provider about:  How to take care of your wound.  When and how you should change your bandage (dressing).  When you should remove your dressing.  Removing whatever was used to close your wound.  Check your wound every day for signs of infection. Watch for:  Redness, swelling, or pain.  Fluid, blood, or pus.  Keep the dressing dry until your health care provider says it can be removed. Do not take baths, swim, use a hot tub, or do anything that would put your wound underwater until your health care provider approves.  Raise (elevate) the injured area above the level of your heart while you are sitting or lying down.  Do not scratch or pick at the wound.  Keep all follow-up visits as told by your health care provider. This is important. SEEK MEDICAL CARE IF:  You received a tetanus shot and  you have swelling, severe pain, redness, or bleeding at the injection site.  You have a fever.  Your pain is not controlled with medicine.  You have increased redness, swelling, or pain at the site of your wound.  You have fluid, blood, or pus coming from your wound.  You notice a bad smell coming from your wound or your dressing. SEEK IMMEDIATE MEDICAL CARE IF:  You have a red streak going away from your wound.   This information is not intended to replace advice given to you by your health care provider. Make sure you discuss any questions you have with your health care provider.   Document Released: 08/08/2008 Document Revised: 03/16/2015 Document Reviewed: 10/26/2014 Elsevier Interactive Patient Education 2016 [employer] Head Injury, Adult You have received a head injury. It does not appear serious at this time. Headaches and vomiting are common following head injury. It should be easy to awaken from sleeping. Sometimes it is necessary for you to stay in the emergency department for a while for observation. Sometimes admission to the hospital may be needed. After injuries such as yours, most problems occur within the first 24 hours, but side effects may occur up to 7-10 days after the injury. It is important for you to carefully monitor your condition and contact your health care provider or seek immediate medical care if there is a change in your condition. WHAT ARE THE TYPES OF HEAD INJURIES? Head  injuries can be as minor as a bump. Some head injuries can be more severe. More severe head injuries include:  A jarring injury to the brain (concussion).  A bruise of the brain (contusion). This mean there is bleeding in the brain that can cause swelling.  A cracked skull (skull fracture).  Bleeding in the brain that collects, clots, and forms a bump (hematoma). WHAT CAUSES A HEAD INJURY? A serious head injury is most likely to happen to someone who is in a car wreck and is not  wearing a seat belt. Other causes of major head injuries include bicycle or motorcycle accidents, sports injuries, and falls. HOW ARE HEAD INJURIES DIAGNOSED? A complete history of the event leading to the injury and your current symptoms will be helpful in diagnosing head injuries. Many times, pictures of the brain, such as CT or MRI are needed to see the extent of the injury. Often, an overnight hospital stay is necessary for observation.  WHEN SHOULD I SEEK IMMEDIATE MEDICAL CARE?  You should get help right away if:  You have confusion or drowsiness.  You feel sick to your stomach (nauseous) or have continued, forceful vomiting.  You have dizziness or unsteadiness that is getting worse.  You have severe, continued headaches not relieved by medicine. Only take over-the-counter or prescription medicines for pain, fever, or discomfort as directed by your health care provider.  You do not have normal function of the arms or legs or are unable to walk.  You notice changes in the black spots in the center of the colored part of your eye (pupil).  You have a clear or bloody fluid coming from your nose or ears.  You have a loss of vision. During the next 24 hours after the injury, you must stay with someone who can watch you for the warning signs. This person should contact local emergency services (911 in the [place] if you have seizures, you become unconscious, or you are unable to wake up. HOW CAN I PREVENT A HEAD INJURY IN THE FUTURE? The most important factor for preventing major head injuries is avoiding motor vehicle accidents. To minimize the potential for damage to your head, it is crucial to wear seat belts while riding in motor vehicles. Wearing helmets while bike riding and playing collision sports (like football) is also helpful. Also, avoiding dangerous activities around the house will further help reduce your risk of head injury.  WHEN CAN I RETURN TO NORMAL ACTIVITIES AND  ATHLETICS? You should be reevaluated by your health care provider before returning to these activities. If you have any of the following symptoms, you should not return to activities or contact sports until 1 week after the symptoms have stopped:  Persistent headache.  Dizziness or vertigo.  Poor attention and concentration.  Confusion.  Memory problems.  Nausea or vomiting.  Fatigue or tire easily.  Irritability.  Intolerant of bright lights or loud noises.  Anxiety or depression.  Disturbed sleep. MAKE SURE YOU:   Understand these instructions.  Will watch your condition.  Will get help right away if you are not doing well or get worse.   This information is not intended to replace advice given to you by your health care provider. Make sure you discuss any questions you have with your health care provider.   Document Released: 10/30/2005 Document Revised: 11/20/2014 Document Reviewed: 07/07/2013 Elsevier Interactive Patient Education [employer]

## 2016-01-30 NOTE — ED Provider Notes (Signed)
CSN: [id number]     Arrival date & time 01/30/16  1037 History   First MD Initiated Contact with Patient 01/30/16 1040     Chief Complaint  Patient presents with  . Fall    on plavix   HPI  Todd Cook is a 75 year old male with PMHx of HTN, CAD, CKD, a fib on plavix and MI presenting after a fall. Patient was attempting to help a friend out of her car when he caught his right foot behind his left. 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 He fell forwards and struck his head against the pavement. He denies LOC or amnesia of the events. He was able to sit up on his own and EMS was called. He is on plavix for a fib and reports easy bruising. He states he feels at his baseline and has no complaints currently. States "I didn't want to even come but my pastor made me, I feel fine". Denies headache, dizziness, lightheadedness, blurred vision, double vision, loss of vision, neck pain, nose pain, nose bleed, chest pain, palpitations, abdominal pain, nausea, vomiting, extremity weakness or extremity numbness. He reports a small skin tear to the right hand and an abrasion over the nose. Unsure of last tetanus.   Past Medical History  Diagnosis Date  . Chronic kidney disease   . Anemia   . Diabetes mellitus   . 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 Coronary artery disease     Status post CABG in 2000; status post NSTEMI 11/12 -LHC 09/20/11: LM 20%, pLAD 70% and mid occluded, CFX and RCA occluded, oL-LAD 60%, pSVG-RCA 80% and dist 40%, pSVG-OM2 30%, pSVG-OM3 20%.  PCI: Promus DES to [facility]   . Ischemic cardiomyopathy     Echocardiogram 09/20/11: Mid to distal anterior, septal and apical akinesis, mild AI, mild MR, mild LAE, EF 35%.  . Gout     left foot  . Carotid stenosis     Carotid Dopplers: RICA without significant stenosis; LICA 60-79%.  . Atrial fibrillation (HCC)     Coumadin deferred due to the need for Plavix and aspirin  post DES for NSTEMI/PCI 11/12  . HLD (hyperlipidemia)   . SOB (shortness of breath)   . MI (myocardial infarction) [facility])    Past Surgical History  Procedure Laterality Date  . Coronary artery bypass graft  10 years ago  . Knee arthroplasty      2000  . Cataract extraction, bilateral    . Eye surgery    . Cardiac catheterization    . Amputation  09/26/2011    Procedure: AMPUTATION BELOW KNEE;  Surgeon: Sherren Kerns, MD;  Location: [facility] OR;  Service: Vascular;  Laterality: Left;  . Left heart catheterization with coronary/graft angiogram N/A 09/20/2011    Procedure: LEFT HEART CATHETERIZATION WITH Isabel Caprice;  Surgeon: Iran Ouch, MD;  Location: [facility] CATH LAB;  Service: Cardiovascular;  Laterality: N/A;  . Percutaneous coronary stent intervention (pci-s) N/A 09/20/2011    Procedure: PERCUTANEOUS CORONARY STENT INTERVENTION (PCI-S);  Surgeon: Iran Ouch, MD;  Location: [facility] CATH LAB;  Service: Cardiovascular;  Laterality: N/A;  . Lower extremity angiogram N/A 09/25/2011    Procedure: LOWER EXTREMITY ANGIOGRAM;  Surgeon: Kathleene Hazel, MD;  Location: [facility] CATH LAB;  Service: Cardiovascular;  Laterality: N/A;  . Carotid angiogram Bilateral 06/11/2014    Procedure: CAROTID ANGIOGRAM;  Surgeon:  Fransisco Hertz, MD;  Location: [facility] CATH LAB;  Service: Cardiovascular;  Laterality: Bilateral;  . Carotid endarterectomy Right 2000    CEA   Family History  Problem Relation Age of Onset  . Heart disease Father     before age 16   Social History  Substance Use Topics  . Smoking status: Never Smoker   . Smokeless tobacco: Never Used  . Alcohol Use: No    Review of Systems  Skin: Positive for wound.  All other systems reviewed and are negative.     Allergies  Ramipril  Home Medications   Prior to Admission medications   Medication Sig Start Date End Date Taking? Authorizing Provider  allopurinol (ZYLOPRIM) 300 MG tablet Take 300 mg by mouth daily.     Yes  Historical Provider, MD  aspirin EC 81 MG tablet Take 81 mg by mouth at bedtime.    Yes Historical Provider, MD  clopidogrel (PLAVIX) 75 MG tablet Take 75 mg by mouth daily.  05/22/14  Yes Historical Provider, MD  colchicine 0.6 MG tablet Take 0.6 mg by mouth daily. COLCRYS   Yes Historical Provider, MD  ferrous sulfate (FERROUSUL) 325 (65 FE) MG tablet Take 325 mg by mouth at bedtime.    Yes Historical Provider, MD  glimepiride (AMARYL) 4 MG tablet Take 4 mg by mouth 2 (two) times daily.   Yes Historical Provider, MD  losartan (COZAAR) 100 MG tablet Take 100 mg by mouth daily.     Yes Historical Provider, MD  metFORMIN (GLUCOPHAGE) 850 MG tablet Take 850 mg by mouth 2 (two) times daily.  08/16/12  Yes Historical Provider, MD  metoprolol (LOPRESSOR) 50 MG tablet Take 50 mg by mouth 2 (two) times daily.  04/29/15  Yes Historical Provider, MD  omeprazole (PRILOSEC) 20 MG capsule Take 20 mg by mouth daily.  05/22/14  Yes Historical Provider, MD  simvastatin (ZOCOR) 20 MG tablet Take 20 mg by mouth daily.    Yes Historical Provider, MD  sitaGLIPtin (JANUVIA) 100 MG tablet Take 100 mg by mouth at bedtime.    Yes Historical Provider, MD  metoprolol (LOPRESSOR) 50 MG tablet Take 1 tablet (50 mg total) by mouth 2 (two) times daily. 10/04/11 07/07/14  Mcarthur Rossetti Angiulli, PA-C   BP 155/89 mmHg  Pulse 60  Temp(Src) 97.7 F (36.5 C) (Oral)  Resp 12  SpO2 100% Physical Exam  Constitutional: He appears well-developed and well-nourished. No distress.  HENT:  Head: Normocephalic and atraumatic.  Mouth/Throat: Oropharynx is clear and moist. No oropharyngeal exudate.  Eyes: Conjunctivae and EOM are normal. Pupils are equal, round, and reactive to light. Right eye exhibits no discharge. Left eye exhibits no discharge.  Neck: Normal range of motion. Neck supple. No rigidity.  No tenderness over the cervical spine. No bony deformities of the C spine. FROM of the neck intact  Cardiovascular: Normal rate and normal  heart sounds.   Pulmonary/Chest: Effort normal and breath sounds normal. No respiratory distress.  Abdominal: Soft. There is no tenderness. There is no rebound and no guarding.  Musculoskeletal: Normal range of motion.  Left BKA. Unable to test gait. Moves all extremities spontaneously. All joints are supple, non-tender and without obvious swelling or deformity.   Neurological: He is alert. No cranial nerve deficit. He exhibits normal muscle tone. Coordination normal.  Cranial nerves 3-12 tested and intact. 5/5 strength of all major muscle groups. Sensation to light touch intact throughout. Finger to nose coordinated. Cannot perform heel to shin  due to BKA.   Skin: Skin is warm and dry.  Two small skin tears noted to the thenar eminence of right palm. No active bleeding. No foreign bodies. Small abrasion across bridge of nose. 2 cm abrasion over the left forehead. No active bleeding. No foreign bodies. No wounds require suture repair  Psychiatric: He has a normal mood and affect. His behavior is normal.  Nursing note and vitals reviewed.   ED Course  Procedures (including critical care time) Labs Review Labs Reviewed  CBC WITH DIFFERENTIAL/PLATELET - Abnormal; Notable for the following:    RBC 4.13 (*)    Hemoglobin 12.0 (*)    HCT 37.5 (*)    Platelets 130 (*)    Eosinophils Absolute 0.8 (*)    All other components within normal limits  BASIC METABOLIC PANEL - Abnormal; Notable for the following:    CO2 20 (*)    Glucose, Bld 199 (*)    Creatinine, Ser 1.50 (*)    GFR calc non Af Amer 43 (*)    GFR calc Af Amer 50 (*)    All other components within normal limits  URINALYSIS, ROUTINE W REFLEX MICROSCOPIC (NOT AT [facility])    Imaging Review Ct Head Wo Contrast  01/30/2016  CLINICAL DATA:  Pain following fall.  Confusion. EXAM: CT HEAD WITHOUT CONTRAST CT CERVICAL SPINE WITHOUT CONTRAST TECHNIQUE: Multidetector CT imaging of the head and cervical spine was performed following the  standard protocol without intravenous contrast. Multiplanar CT image reconstructions of the cervical spine were also generated. COMPARISON:  None. FINDINGS: CT HEAD FINDINGS There is mild diffuse atrophy. There is no intracranial mass, hemorrhage, extra-axial fluid collection, or midline shift. There is slight small vessel disease in the centra semiovale bilaterally. Elsewhere, gray-white compartments appear normal. No acute infarct evident. Bony calvarium appears intact. Mastoid air cells on the right are clear. There are a few opacified inferior mastoid air cells on the left. Most the mastoids on the left are clear. No intraorbital lesions are evident. There is mucosal thickening in the left maxillary antrum. CT CERVICAL SPINE FINDINGS There is no fracture or spondylolisthesis. Prevertebral soft tissues and predental space regions are normal. There is severe disc space narrowing at C5-6 and C6-7. There are anterior osteophytes at C5, C6, and C7. There is mild calcification in the anterior ligament at C3-4 and C4-5. There is facet hypertrophy at multiple levels. There is exit foraminal narrowing at C5-6 on the right and at C6-7 bilaterally. There is calcification in both carotid arteries. There is also calcification at scattered foci and vertebral arteries bilaterally. IMPRESSION: CT head: Mild atrophy with minimal periventricular small vessel disease. No intracranial mass, hemorrhage, or extra-axial fluid collection. No acute infarct evident. Mild inferior left-sided mastoid air cell disease. Mastoids on the right are clear. Left maxillary sinus disease noted. CT cervical spine: No demonstrable fracture or spondylolisthesis. Osteoarthritic change, most marked at C5-6 and C6-7. Scattered foci of carotid and vertebral artery calcification bilaterally. Electronically Signed   By: Bretta Bang III M.D.   On: 01/30/2016 12:49   Ct Cervical Spine Wo Contrast  01/30/2016  CLINICAL DATA:  Pain following fall.   Confusion. EXAM: CT HEAD WITHOUT CONTRAST CT CERVICAL SPINE WITHOUT CONTRAST TECHNIQUE: Multidetector CT imaging of the head and cervical spine was performed following the standard protocol without intravenous contrast. Multiplanar CT image reconstructions of the cervical spine were also generated. COMPARISON:  None. FINDINGS: CT HEAD FINDINGS There is mild diffuse atrophy. There is no intracranial  mass, hemorrhage, extra-axial fluid collection, or midline shift. There is slight small vessel disease in the centra semiovale bilaterally. Elsewhere, gray-white compartments appear normal. No acute infarct evident. Bony calvarium appears intact. Mastoid air cells on the right are clear. There are a few opacified inferior mastoid air cells on the left. Most the mastoids on the left are clear. No intraorbital lesions are evident. There is mucosal thickening in the left maxillary antrum. CT CERVICAL SPINE FINDINGS There is no fracture or spondylolisthesis. Prevertebral soft tissues and predental space regions are normal. There is severe disc space narrowing at C5-6 and C6-7. There are anterior osteophytes at C5, C6, and C7. There is mild calcification in the anterior ligament at C3-4 and C4-5. There is facet hypertrophy at multiple levels. There is exit foraminal narrowing at C5-6 on the right and at C6-7 bilaterally. There is calcification in both carotid arteries. There is also calcification at scattered foci and vertebral arteries bilaterally. IMPRESSION: CT head: Mild atrophy with minimal periventricular small vessel disease. No intracranial mass, hemorrhage, or extra-axial fluid collection. No acute infarct evident. Mild inferior left-sided mastoid air cell disease. Mastoids on the right are clear. Left maxillary sinus disease noted. CT cervical spine: No demonstrable fracture or spondylolisthesis. Osteoarthritic change, most marked at C5-6 and C6-7. Scattered foci of carotid and vertebral artery calcification  bilaterally. Electronically Signed   By: Bretta Bang III M.D.   On: 01/30/2016 12:49   I have personally reviewed and evaluated these images and lab results as part of my medical decision-making.   EKG Interpretation None      MDM   Final diagnoses:  Fall, initial encounter  Abrasion of head, initial encounter   75 year old male presenting after a mechanical fall earlier today. Patient is on Plavix. Denies all complaints at this time. Hemodynamically stable and in no acute distress. Nonfocal neuro exam. Moves extremities spontaneously without tenderness or deformity of the major joints. Patient has left below the knee amputation. 2 small skin tears noted to right thenar eminence. Superficial abrasions over the nose and left forehead. No wounds require suture repair. Hemoglobin 12 which appears somewhat better than his baseline. Creatinine 1.5 which is at his baseline. CT head and neck without acute findings. Tetanus updated in emergency department. Patient is stable for discharge. I discussed reasons to return immediately to the emergency department. Return precautions given in discharge paperwork and discussed with pt at bedside. Pt stable for discharge     Alveta Heimlich, PA-C 01/30/16 1448  Margarita Grizzle, MD 01/30/16 1556

## 2016-01-30 NOTE — ED Notes (Signed)
Pt ambulating independently w/ steady gait on d/c in no acute distress, A&Ox4.D/c instructions reviewed w/ pt and family - pt and family deny any further questions or concerns at present.  

## 2016-05-29 ENCOUNTER — Ambulatory Visit (INDEPENDENT_AMBULATORY_CARE_PROVIDER_SITE_OTHER): Payer: Commercial Managed Care - HMO | Admitting: Podiatry

## 2016-05-29 ENCOUNTER — Encounter: Payer: Self-pay | Admitting: Podiatry

## 2016-05-29 DIAGNOSIS — M79676 Pain in unspecified toe(s): Secondary | ICD-10-CM

## 2016-05-29 DIAGNOSIS — E119 Type 2 diabetes mellitus without complications: Secondary | ICD-10-CM

## 2016-05-29 DIAGNOSIS — Z89512 Acquired absence of left leg below knee: Secondary | ICD-10-CM | POA: Diagnosis not present

## 2016-05-29 DIAGNOSIS — I739 Peripheral vascular disease, unspecified: Secondary | ICD-10-CM

## 2016-05-29 DIAGNOSIS — B351 Tinea unguium: Secondary | ICD-10-CM

## 2016-05-29 DIAGNOSIS — M2041 Other hammer toe(s) (acquired), right foot: Secondary | ICD-10-CM | POA: Diagnosis not present

## 2016-05-29 DIAGNOSIS — E1140 Type 2 diabetes mellitus with diabetic neuropathy, unspecified: Secondary | ICD-10-CM | POA: Diagnosis not present

## 2016-05-30 NOTE — Progress Notes (Signed)
Patient ID: Todd Cook, male   DOB: 06-Nov-1941, 75 y.o.   MRN: [id number]  Subjective: 75 year old male presents the office today for requested diabetic shoes as well as for thick, elongated toenails on the right-sided he cannot trim himself. He has a history of a left below-knee amputation. He currently denies any open sores on the right foot. Denies any swelling redness or warmth. Denies any systemic complaints such as fevers, chills, nausea, vomiting. No acute changes since last appointment, and no other complaints at this time.   Objective: AAO x3, NAD DP/PT pulses decreased bilaterally, CRT less than 3 seconds Protective sensation decreased with Simms Weinstein monofilament, vibratory sensation decreased Left below-knee amputation Nails on the right foot are hypertrophic, dystrophic, discolored and elongated. They cause irritation shoe gears. No surrounding redness or drainage. Hammertoes are present on the right foot. No areas of pinpoint bony tenderness or pain with vibratory sensation. MMT 5/5, ROM WNL. No edema, erythema, increase in warmth to bilateral lower extremities.  No open lesions or pre-ulcerative lesions.  No pain with calf compression, swelling, warmth, erythema  Assessment: 75 year old male presents requesting Shoes and for symptomatic onychomycosis right foot  Plan: -All treatment options discussed with the patient including all alternatives, risks, complications.  -Nails debrided 1-5 on the right foot -Daily foot inspection -Unable was completed for your precertification diabetic shoes. Given history of amputation as well as digital deformities and neuropathy of the right foot I do recommend diabetic shoes. -Follow-up in 3 months or sooner if any problems arise. In the meantime, encouraged to call the office with any questions, concerns, change in symptoms.   Ovid Curd, DPM

## 2016-07-28 ENCOUNTER — Encounter: Payer: Self-pay | Admitting: Vascular Surgery

## 2016-08-03 ENCOUNTER — Ambulatory Visit (HOSPITAL_COMMUNITY)
Admission: RE | Admit: 2016-08-03 | Discharge: 2016-08-03 | Disposition: A | Discharge status: Home / Self Care | Payer: Commercial Managed Care - HMO | Source: Ambulatory Visit | Attending: Vascular Surgery | Admitting: Vascular Surgery

## 2016-08-03 ENCOUNTER — Ambulatory Visit (INDEPENDENT_AMBULATORY_CARE_PROVIDER_SITE_OTHER): Payer: Commercial Managed Care - HMO | Admitting: Vascular Surgery

## 2016-08-03 ENCOUNTER — Encounter: Payer: Self-pay | Admitting: Vascular Surgery

## 2016-08-03 VITALS — BP 158/61 | HR 54 | Ht 70.0 in | Wt 183.0 lb

## 2016-08-03 DIAGNOSIS — I6523 Occlusion and stenosis of bilateral carotid arteries: Secondary | ICD-10-CM

## 2016-08-03 LAB — VAS US CAROTID
LCCAPDIAS: 9 cm/s
LCCAPSYS: 84 cm/s
LEFT ECA DIAS: -12 cm/s
LEFT VERTEBRAL DIAS: -21 cm/s
LICADDIAS: -20 cm/s
Left CCA dist dias: -11 cm/s
Left CCA dist sys: -97 cm/s
Left ICA dist sys: -157 cm/s
RCCAPDIAS: 9 cm/s
RIGHT CCA MID DIAS: 10 cm/s
RIGHT ECA DIAS: -6 cm/s
RIGHT VERTEBRAL DIAS: 7 cm/s
Right CCA prox sys: 86 cm/s
Right cca dist sys: -57 cm/s

## 2016-08-03 NOTE — Progress Notes (Signed)
[facility] [place]   Patient name: Todd Cook H Mansouri MRN: [id number]        DOB: 1941-10-12          Sex: male   REASON FOR VISIT: Follow-up   HPI: Todd Cook H Hobbins is a 75 y.o. male who presents for routine carotid follow-up. He previously had a right carotid endarterectomy by Dr. Madilyn FiremanHayes years ago.  The patient denies any amaurosis fugax, sudden onset weakness or numbness, expressive or receptive aphasia. He takes an aspirin, Plavix, and statin daily.   He reports that he has been walking okay with his left prosthesis.        Past Medical History  Diagnosis Date  . Chronic kidney disease    . Anemia    . Diabetes mellitus    . 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 Hypertension    . Coronary artery disease        Status post CABG in 2000; status post NSTEMI 11/12 -LHC 09/20/11: LM 20%, pLAD 70% and mid occluded, CFX and RCA occluded, oL-LAD 60%, pSVG-RCA 80% and dist 40%, pSVG-OM2 30%, pSVG-OM3 20%.  PCI: Promus DES to [facility]-RCA   . Ischemic cardiomyopathy        Echocardiogram 09/20/11: Mid to distal anterior, septal and apical akinesis, mild AI, mild MR, mild LAE, EF 35%.  . Gout        left foot  . Carotid stenosis        Carotid Dopplers: RICA without significant stenosis; LICA 60-79%.  . Atrial fibrillation (HCC)        Coumadin deferred due to the need for Plavix and aspirin post DES for NSTEMI/PCI 11/12  . HLD (hyperlipidemia)    . SOB (shortness of breath)    . MI (myocardial infarction) (HCC)              Family History  Problem Relation Age of Onset  . Heart disease Father        before age 77      SOCIAL HISTORY:     Social History  Substance Use Topics  . Smoking status: Never Smoker   . Smokeless tobacco: Never Used  . Alcohol Use: No          Allergies  Allergen Reactions  . Ramipril Anaphylaxis      Current Outpatient Prescriptions on File Prior to Visit  Medication Sig Dispense Refill    . allopurinol (ZYLOPRIM) 300 MG tablet Take 300 mg by mouth daily.      Marland Kitchen. aspirin EC 81 MG tablet Take 81 mg by mouth at bedtime.     . clopidogrel (PLAVIX) 75 MG tablet Take 75 mg by mouth daily.     . colchicine 0.6 MG tablet Take 0.6 mg by mouth daily. COLCRYS    . ferrous sulfate (FERROUSUL) 325 (65 FE) MG tablet Take 325 mg by mouth at bedtime.     Marland Kitchen. glimepiride (AMARYL) 4 MG tablet Take 4 mg by mouth 2 (two) times daily.    Marland Kitchen. losartan (COZAAR) 100 MG tablet Take 100 mg by mouth daily.      . metFORMIN (GLUCOPHAGE) 850 MG tablet Take 850 mg by mouth 2 (two) times daily.     Marland Kitchen. omeprazole (PRILOSEC) 20 MG capsule Take 20 mg by mouth daily.     . simvastatin (ZOCOR) 20 MG tablet Take 20 mg by mouth daily.     .Marland Kitchen  sitaGLIPtin (JANUVIA) 100 MG tablet Take 100 mg by mouth at bedtime.     . metoprolol (LOPRESSOR) 50 MG tablet Take 1 tablet (50 mg total) by mouth 2 (two) times daily. 60 tablet 1   No current facility-administered medications on file prior to visit.      REVIEW OF SYSTEMS: He denies shortness of breath. He denies chest pain.   Vitals:   08/03/16 1108 08/03/16 1112 08/03/16 1115  BP: (!) 149/60 (!) 190/73 (!) 158/61  Pulse: (!) 54    SpO2: 100%    Weight: 183 lb (83 kg)    Height: 5\' 10"  (1.778 m)       GENERAL: The patient is a well-nourished male, in no acute distress. The vital signs are documented above. VASCULAR: Radial pulses palpable.  No carotid bruit PULMONARY: Nonlabored respiratory effort. NEUROLOGIC: No focal weakness or paresthesias are detected. SKIN: There are no ulcers or rashes noted. PSYCHIATRIC: The patient has a normal affect.   DATA:  Carotid duplex 08/03/2016   Less than 40% stenosis right internal carotid artery. Greater then 50% stenosis in the distal right common carotid artery with an irregular plaque formation which demonstrates slight mobility. This is unchanged from carotid duplex 6 months and one year ago.   60-79% stenosis left carotid  artery.   Vertebral arteries patent bilaterally with antegrade flow.   MEDICAL ISSUES: Asymptomatic bilateral carotid stenosis s/p right CEA (2000)   The patient's carotid duplex studies are essentially unchanged from 6 months ago.  He has 60-79% stenosis of the left internal carotid artery. He has remained asymptomatic without any TIA or stroke symptoms. He is on maximal medical management with aspirin, Plavix and a statin. He will follow up in 6 months with repeat carotid duplex.    Fabienne Bruns, MD [facility] [place] Office: [phone] Pager: [phone]

## 2016-08-08 ENCOUNTER — Ambulatory Visit: Payer: Commercial Managed Care - HMO | Admitting: *Deleted

## 2016-08-08 DIAGNOSIS — E1140 Type 2 diabetes mellitus with diabetic neuropathy, unspecified: Secondary | ICD-10-CM

## 2016-08-08 NOTE — Progress Notes (Signed)
Patient ID: Todd Cook, male   DOB: 11-27-1940, 75 y.o.   MRN: [id number]  Patient presents to be scanned and measured for diabetic shoes and inserts.

## 2016-09-15 ENCOUNTER — Ambulatory Visit (INDEPENDENT_AMBULATORY_CARE_PROVIDER_SITE_OTHER): Payer: Commercial Managed Care - HMO | Admitting: Podiatry

## 2016-09-15 DIAGNOSIS — I739 Peripheral vascular disease, unspecified: Secondary | ICD-10-CM | POA: Diagnosis not present

## 2016-09-15 DIAGNOSIS — M79676 Pain in unspecified toe(s): Secondary | ICD-10-CM

## 2016-09-15 DIAGNOSIS — Z89512 Acquired absence of left leg below knee: Secondary | ICD-10-CM

## 2016-09-15 DIAGNOSIS — E1140 Type 2 diabetes mellitus with diabetic neuropathy, unspecified: Secondary | ICD-10-CM

## 2016-09-15 NOTE — Patient Instructions (Signed)

## 2016-09-15 NOTE — Progress Notes (Signed)
Patient presents for diabetic shoe pick up, shoes are tried on for good fit.  Patient received 1 Pair and 3 pairs custom molded diabetic inserts.  Verbal and written break in and wear instructions given.  Patient will follow up for scheduled routine care.   

## 2016-10-16 NOTE — Addendum Note (Signed)
Addended by: Burton Apley A on: 10/16/2016 03:45 PM   Modules accepted: Orders

## 2016-12-26 ENCOUNTER — Telehealth: Payer: Self-pay | Admitting: Internal Medicine

## 2016-12-26 NOTE — Telephone Encounter (Signed)
Received incoming call from Dr. Chilton Si. Dr. Chilton Si stated pt had CABG 2000 and NSTEMI 2012. Pt has new murmur. Pt has episodes of chest pressure if walking 150 feet, or going up/down stairs. Dr. Chilton Si would like pt to have stress echo. Pt above the knee amputee, so pt would need Lexiscan. Informed pt is over due for his f/u appt with Dr. Ladona Ridgel. Pt's last appt with Dr. Ladona Ridgel was 07/07/2014. Dr. Chilton Si stated he is aware. Informed we will contact pt to set up office visit and testing. I will forward to Dr. Ladona Ridgel to advise.

## 2016-12-26 NOTE — Telephone Encounter (Signed)
New message   Dr Chilton Si states : Please call pt has chest discomfort and needs an evaluation, would like to speak with you before setting up the appt

## 2016-12-26 NOTE — Telephone Encounter (Signed)
Called, spoke with pt's wife, Laverle Patter. Wife stated pt is hard of hearing. Informed Dr. Chilton Si recommended stress echo and f/u appt with Dr. Ladona Ridgel. Informed I will forward information to Dr. Ladona Ridgel to advise. I will call back with recommendation (stress echo may be scheduled prior to appt w/ Dr. Ladona Ridgel). Wife verbalized understanding.

## 2016-12-26 NOTE — Telephone Encounter (Signed)
Called, pt unavailable. Left voice message for return call.

## 2016-12-26 NOTE — Telephone Encounter (Signed)
Follow up     Pt is returning call to Dr. Lubertha Basque nurse.

## 2016-12-26 NOTE — Telephone Encounter (Signed)
Called, Dr. Chilton Si unavailable. Left voice message for return call.

## 2017-01-01 ENCOUNTER — Encounter: Payer: Self-pay | Admitting: Internal Medicine

## 2017-01-01 ENCOUNTER — Ambulatory Visit (INDEPENDENT_AMBULATORY_CARE_PROVIDER_SITE_OTHER): Payer: Medicare HMO | Admitting: Internal Medicine

## 2017-01-01 VITALS — BP 156/58 | HR 46 | Ht 70.0 in | Wt 185.0 lb

## 2017-01-01 DIAGNOSIS — I10 Essential (primary) hypertension: Secondary | ICD-10-CM | POA: Diagnosis not present

## 2017-01-01 DIAGNOSIS — R0789 Other chest pain: Secondary | ICD-10-CM

## 2017-01-01 DIAGNOSIS — I255 Ischemic cardiomyopathy: Secondary | ICD-10-CM

## 2017-01-01 NOTE — Patient Instructions (Addendum)
Medication Instructions:  Your physician recommends that you continue on your current medications as directed. Please refer to the Current Medication list given to you today.   Labwork: None Ordered   Testing/Procedures: Your physician has requested that you have a lexiscan myoview.   Follow-Up: Follow-up to be determined after results of myoview  Any Other Special Instructions Will Be Listed Below (If Applicable).     If you need a refill on your cardiac medications before your next appointment, please call your pharmacy.

## 2017-01-01 NOTE — Progress Notes (Signed)
HPI Mr. Bault returns today for followup. He is a pleasant 76 yo man with a h/o PAF, HTN, and CAD. He is s/p CABG. He has carotid vascular disease and is s/p remote CEA. He has sinus node dysfunction. He is s/p amputation on the left with BKA. The patient denies sob, or peripheral edema. No syncope.  He has developed carotid vascular disease. The patient otherwise feels chest pressure and sob with exertion which gets better with rest.  Allergies  Allergen Reactions  . Ramipril Anaphylaxis     Current Outpatient Prescriptions  Medication Sig Dispense Refill  . allopurinol (ZYLOPRIM) 300 MG tablet Take 300 mg by mouth daily.      Marland Kitchen aspirin EC 81 MG tablet Take 81 mg by mouth at bedtime.     . clopidogrel (PLAVIX) 75 MG tablet Take 75 mg by mouth daily.     . colchicine 0.6 MG tablet Take 0.6 mg by mouth daily. COLCRYS    . ferrous sulfate (FERROUSUL) 325 (65 FE) MG tablet Take 325 mg by mouth at bedtime.     Marland Kitchen glimepiride (AMARYL) 4 MG tablet Take 4 mg by mouth 2 (two) times daily.    Marland Kitchen losartan (COZAAR) 100 MG tablet Take 100 mg by mouth daily.      . metFORMIN (GLUCOPHAGE) 850 MG tablet Take 850 mg by mouth 2 (two) times daily.     . metoprolol (LOPRESSOR) 50 MG tablet Take 1 tablet (50 mg total) by mouth 2 (two) times daily. 60 tablet 1  . omeprazole (PRILOSEC) 20 MG capsule Take 20 mg by mouth daily.     . simvastatin (ZOCOR) 20 MG tablet Take 20 mg by mouth daily.     . sitaGLIPtin (JANUVIA) 100 MG tablet Take 100 mg by mouth at bedtime.      No current facility-administered medications for this visit.      Past Medical History:  Diagnosis Date  . Anemia   . Atrial fibrillation (HCC)    Coumadin deferred due to the need for Plavix and aspirin post DES for NSTEMI/PCI 11/12  . Carotid stenosis    Carotid Dopplers: RICA without significant stenosis; LICA 60-79%.  . Chronic kidney disease   . Coronary artery disease    Status post CABG in 2000; status post NSTEMI 11/12 -LHC  09/20/11: LM 20%, pLAD 70% and mid occluded, CFX and RCA occluded, oL-LAD 60%, pSVG-RCA 80% and dist 40%, pSVG-OM2 30%, pSVG-OM3 20%.  PCI: Promus DES to [facility]   . Diabetes mellitus   . Gout    left foot  . HLD (hyperlipidemia)   . Hypertension   . Ischemic cardiomyopathy    Echocardiogram 09/20/11: Mid to distal anterior, septal and apical akinesis, mild AI, mild MR, mild LAE, EF 35%.  . MI (myocardial infarction)   . 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 SOB (shortness of breath)     ROS:   All systems reviewed and negative except as noted in the HPI.   Past Surgical History:  Procedure Laterality Date  . AMPUTATION  09/26/2011   Procedure: AMPUTATION BELOW KNEE;  Surgeon: Sherren Kerns, MD;  Location: [facility] OR;  Service: Vascular;  Laterality: Left;  . CARDIAC CATHETERIZATION    . CAROTID ANGIOGRAM Bilateral 06/11/2014   Procedure: CAROTID ANGIOGRAM;  Surgeon: Fransisco Hertz, MD;  Location: [facility] CATH LAB;  Service: Cardiovascular;  Laterality: Bilateral;  . CAROTID ENDARTERECTOMY Right 2000   CEA  .  CATARACT EXTRACTION, BILATERAL    . CORONARY ARTERY BYPASS GRAFT  10 years ago  . EYE SURGERY    . KNEE ARTHROPLASTY     2000  . LEFT HEART CATHETERIZATION WITH CORONARY/GRAFT ANGIOGRAM N/A 09/20/2011   Procedure: LEFT HEART CATHETERIZATION WITH Isabel Caprice;  Surgeon: Iran Ouch, MD;  Location: [facility] CATH LAB;  Service: Cardiovascular;  Laterality: N/A;  . LOWER EXTREMITY ANGIOGRAM N/A 09/25/2011   Procedure: LOWER EXTREMITY ANGIOGRAM;  Surgeon: Kathleene Hazel, MD;  Location: [facility] CATH LAB;  Service: Cardiovascular;  Laterality: N/A;  . PERCUTANEOUS CORONARY STENT INTERVENTION (PCI-S) N/A 09/20/2011   Procedure: PERCUTANEOUS CORONARY STENT INTERVENTION (PCI-S);  Surgeon: Iran Ouch, MD;  Location: [facility] CATH LAB;  Service: Cardiovascular;  Laterality: N/A;     Family History  Problem Relation Age of Onset  . Heart disease Father      before age 49     Social History   Social History  . Marital status: Married    Spouse name: N/A  . Number of children: N/A  . Years of education: N/A   Occupational History  . Not on file.   Social History Main Topics  . Smoking status: Never Smoker  . Smokeless tobacco: Never Used  . Alcohol use No  . Drug use: No  . Sexual activity: No   Other Topics Concern  . Not on file   Social History Narrative  . No narrative on file     BP (!) 156/58   Pulse (!) 46   Ht 5\' 10"  (1.778 m)   Wt 185 lb (83.9 kg)   BMI 26.54 kg/m   Physical Exam:  Well appearing 76 year old man, NAD HEENT: Unremarkable Neck:  No JVD, no thyromegally Lungs:  Clear with no wheezes HEART:  Regular rate rhythm, no murmurs, no rubs, no clicks Abd:  soft, positive bowel sounds, no organomegally, no rebound, no guarding Ext: left BKA Skin:  No rashes no nodules Neuro:  CN II through XII intact, motor grossly intact   Assess/Plan:  1. Chest pressure with exertion - I suspect he is having angina. He is not a great revascularization candidate. Will obtain a lexiscan myoview but would only consider coronary angios if his stress test is high risk. 2. CAD - note prior PCI about 5 years ago 3. HTN heart disease - his blood pressure is up a bit. We have some limitation as to meds with bradycardia 4. Sinus node dysfunction - he is asymptomatic. Will undergo watchful waiting.  Leonia Reeves.D.

## 2017-01-04 ENCOUNTER — Telehealth (HOSPITAL_COMMUNITY): Payer: Self-pay | Admitting: *Deleted

## 2017-01-04 NOTE — Telephone Encounter (Signed)
Left message on voicemail per DPR in reference to upcoming appointment scheduled on 01/08/17 with detailed instructions given per Myocardial Perfusion Study Information Sheet for the test. LM to arrive 15 minutes early, and that it is imperative to arrive on time for appointment to keep from having the test rescheduled. If you need to cancel or reschedule your appointment, please call the office within 24 hours of your appointment. Failure to do so may result in a cancellation of your appointment, and a $50 no show fee. Phone number given for call back for any questions. Deedra Pro Jacqueline    

## 2017-01-08 ENCOUNTER — Ambulatory Visit (HOSPITAL_COMMUNITY): Payer: Medicare HMO | Attending: Internal Medicine

## 2017-01-08 ENCOUNTER — Ambulatory Visit (INDEPENDENT_AMBULATORY_CARE_PROVIDER_SITE_OTHER): Payer: Medicare HMO | Admitting: Internal Medicine

## 2017-01-08 DIAGNOSIS — R9439 Abnormal result of other cardiovascular function study: Secondary | ICD-10-CM | POA: Diagnosis not present

## 2017-01-08 DIAGNOSIS — I501 Left ventricular failure: Secondary | ICD-10-CM | POA: Diagnosis not present

## 2017-01-08 DIAGNOSIS — R0789 Other chest pain: Secondary | ICD-10-CM | POA: Diagnosis not present

## 2017-01-08 DIAGNOSIS — I255 Ischemic cardiomyopathy: Secondary | ICD-10-CM | POA: Insufficient documentation

## 2017-01-08 DIAGNOSIS — I252 Old myocardial infarction: Secondary | ICD-10-CM | POA: Diagnosis not present

## 2017-01-08 LAB — MYOCARDIAL PERFUSION IMAGING
CHL CUP RESTING HR STRESS: 44 {beats}/min
LHR: 0.35
LVDIAVOL: 126 mL (ref 62–150)
LVSYSVOL: 75 mL
NUC STRESS TID: 1.07
Peak HR: 63 {beats}/min
SDS: 9
SRS: 13
SSS: 22

## 2017-01-08 MED ORDER — ISOSORBIDE MONONITRATE ER 30 MG PO TB24: 30.0000 mg | ORAL_TABLET | Freq: Every day | ORAL | 3 refills | Status: DC

## 2017-01-08 MED ORDER — TECHNETIUM TC 99M TETROFOSMIN IV KIT
31.6000 | PACK | Freq: Once | INTRAVENOUS | Status: AC | PRN
  Administered 2017-01-08: 31.6 via INTRAVENOUS
  Filled 2017-01-08: qty 32

## 2017-01-08 MED ORDER — TECHNETIUM TC 99M TETROFOSMIN IV KIT
10.2000 | PACK | Freq: Once | INTRAVENOUS | Status: AC | PRN
  Administered 2017-01-08: 10.2 via INTRAVENOUS
  Filled 2017-01-08: qty 11

## 2017-01-08 MED ORDER — REGADENOSON 0.4 MG/5ML IV SOLN
0.4000 mg | Freq: Once | INTRAVENOUS | Status: AC
  Administered 2017-01-08: 0.4 mg via INTRAVENOUS

## 2017-01-08 NOTE — Patient Instructions (Signed)
Medication Instructions: - Your physician has recommended you make the following change in your medication:  1) Start imdur (isosorbide mononitrate) 30 mg- take one tablet by mouth once daily  Labwork: - none ordered  Procedures/Testing: - none ordered  Follow-Up: - per Dr. Ladona Ridgel  Any Additional Special Instructions Will Be Listed Below (If Applicable).     If you need a refill on your cardiac medications before your next appointment, please call your pharmacy.

## 2017-01-08 NOTE — Progress Notes (Signed)
Patient Care Team: Nila Nephew, MD as PCP - General (Internal Medicine) Marinus Maw, MD as Consulting Physician (Cardiology)   HPI  Todd Cook is a 76 y.o. male Pt of Dr Leonia Reeves seen as an add on from the nuclear lab after his [name] was noted to be abnormal  He has CAD CABG 2000 and DES 2012 with crescendo CP-- myoview reviewed with Dr PN showing anteroseptal infarct with moderate periiinfarct ischemia.  SDS 9    No rest pain  Dr Leonia Reeves called-- concern was for high risk ischmemia because of poor distal targets  Records and Results Reviewed As above   Past Medical History:  Diagnosis Date  . Anemia   . Atrial fibrillation (HCC)    Coumadin deferred due to the need for Plavix and aspirin post DES for NSTEMI/PCI 11/12  . Carotid stenosis    Carotid Dopplers: RICA without significant stenosis; LICA 60-79%.  . Chronic kidney disease   . Coronary artery disease    Status post CABG in 2000; status post NSTEMI 11/12 -LHC 09/20/11: LM 20%, pLAD 70% and mid occluded, CFX and RCA occluded, oL-LAD 60%, pSVG-RCA 80% and dist 40%, pSVG-OM2 30%, pSVG-OM3 20%.  PCI: Promus DES to [facility]   . Diabetes mellitus   . Gout    left foot  . HLD (hyperlipidemia)   . Hypertension   . Ischemic cardiomyopathy    Echocardiogram 09/20/11: Mid to distal anterior, septal and apical akinesis, mild AI, mild MR, mild LAE, EF 35%.  . MI (myocardial infarction)   . 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 SOB (shortness of breath)     Past Surgical History:  Procedure Laterality Date  . AMPUTATION  09/26/2011   Procedure: AMPUTATION BELOW KNEE;  Surgeon: Sherren Kerns, MD;  Location: [facility] OR;  Service: Vascular;  Laterality: Left;  . CARDIAC CATHETERIZATION    . CAROTID ANGIOGRAM Bilateral 06/11/2014   Procedure: CAROTID ANGIOGRAM;  Surgeon: Fransisco Hertz, MD;  Location: [facility] CATH LAB;  Service: Cardiovascular;  Laterality: Bilateral;  . CAROTID ENDARTERECTOMY Right  2000   CEA  . CATARACT EXTRACTION, BILATERAL    . CORONARY ARTERY BYPASS GRAFT  10 years ago  . EYE SURGERY    . KNEE ARTHROPLASTY     2000  . LEFT HEART CATHETERIZATION WITH CORONARY/GRAFT ANGIOGRAM N/A 09/20/2011   Procedure: LEFT HEART CATHETERIZATION WITH Isabel Caprice;  Surgeon: Iran Ouch, MD;  Location: [facility] CATH LAB;  Service: Cardiovascular;  Laterality: N/A;  . LOWER EXTREMITY ANGIOGRAM N/A 09/25/2011   Procedure: LOWER EXTREMITY ANGIOGRAM;  Surgeon: Kathleene Hazel, MD;  Location: [facility] CATH LAB;  Service: Cardiovascular;  Laterality: N/A;  . PERCUTANEOUS CORONARY STENT INTERVENTION (PCI-S) N/A 09/20/2011   Procedure: PERCUTANEOUS CORONARY STENT INTERVENTION (PCI-S);  Surgeon: Iran Ouch, MD;  Location: [facility] CATH LAB;  Service: Cardiovascular;  Laterality: N/A;    Current Outpatient Prescriptions  Medication Sig Dispense Refill  . allopurinol (ZYLOPRIM) 300 MG tablet Take 300 mg by mouth daily.      Marland Kitchen aspirin EC 81 MG tablet Take 81 mg by mouth at bedtime.     . clopidogrel (PLAVIX) 75 MG tablet Take 75 mg by mouth daily.     . colchicine 0.6 MG tablet Take 0.6 mg by mouth daily. COLCRYS    . ferrous sulfate (FERROUSUL) 325 (65 FE) MG tablet Take 325 mg by mouth at bedtime.     Marland Kitchen  glimepiride (AMARYL) 4 MG tablet Take 4 mg by mouth 2 (two) times daily.    . isosorbide mononitrate (IMDUR) 30 MG 24 hr tablet Take 1 tablet (30 mg total) by mouth daily. 90 tablet 3  . losartan (COZAAR) 100 MG tablet Take 100 mg by mouth daily.      . metFORMIN (GLUCOPHAGE) 850 MG tablet Take 850 mg by mouth 2 (two) times daily.     . metoprolol (LOPRESSOR) 50 MG tablet Take 1 tablet (50 mg total) by mouth 2 (two) times daily. 60 tablet 1  . omeprazole (PRILOSEC) 20 MG capsule Take 20 mg by mouth daily.     . simvastatin (ZOCOR) 20 MG tablet Take 20 mg by mouth daily.     . sitaGLIPtin (JANUVIA) 100 MG tablet Take 100 mg by mouth at bedtime.      No current  facility-administered medications for this visit.     Allergies  Allergen Reactions  . Ramipril Anaphylaxis      Review of Systems negative except from HPI and PMH  Physical Exam See nuclear reportWell developed and well nourished in no acute distress HENT normal E scleral and icterus clear Neck Supple JVP flat; carotids brisk and full Clear to ausculation  *Regular rate and rhythm, no murmurs gallops or rub Soft with active bowel sounds No clubbing cyanosis   Edema Alert and oriented, grossly normal motor and sensory function Skin Warm and Dry    Assessment and  Plan CAD  Ischemic Cardiomyopathy  Abnormal nuclear study    Will begin imdur 30mg   as the role of intervention would be for symptom reduction as opposed to hard outcomes.  His BP would allow more aggressive medications and given concerns this might be the preferred strateagy  Ranolazine would also be useful         Current medicines are reviewed at length with the patient today .  The patient does not  have concerns regarding medicines.

## 2017-01-16 IMAGING — CT CT HEAD W/O CM
4 of 6 series · 15 of 47 positions shown, 16 images · non-contrast
Comparison: None.

CLINICAL DATA: Pain following fall.  Confusion.

EXAM:
CT HEAD WITHOUT CONTRAST
CT CERVICAL SPINE WITHOUT CONTRAST
TECHNIQUE: Multidetector CT imaging of the head and cervical spine was
performed following the standard protocol without intravenous
contrast. Multiplanar CT image reconstructions of the cervical spine
were also generated.

[Series 2: head without · axial · non-contrast · 0.43mm/px · z∈[+1381,+1551]mm · 3 of 35 slices shown, 4 images]
[im 1/35  brain]
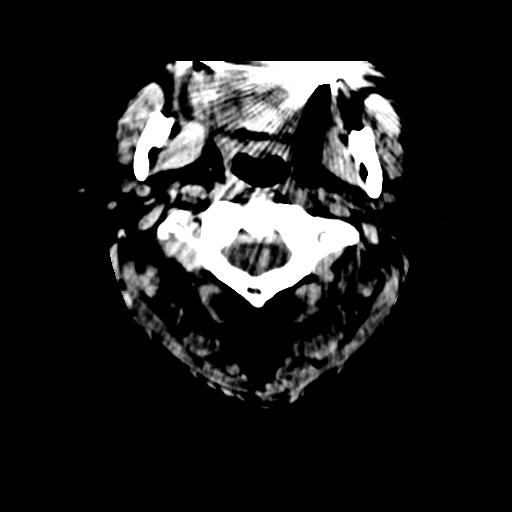
[im 1/35  bone]
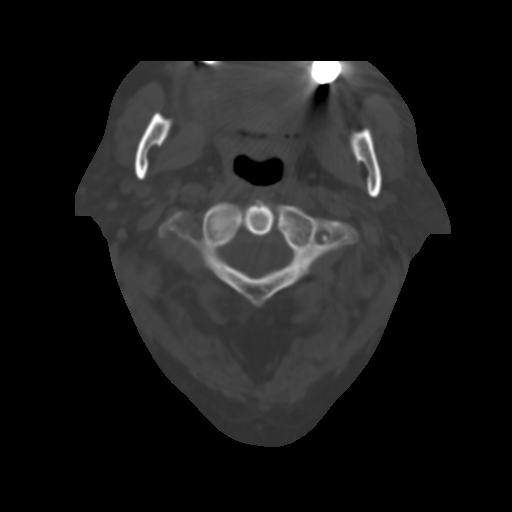
[im 18/35  brain]
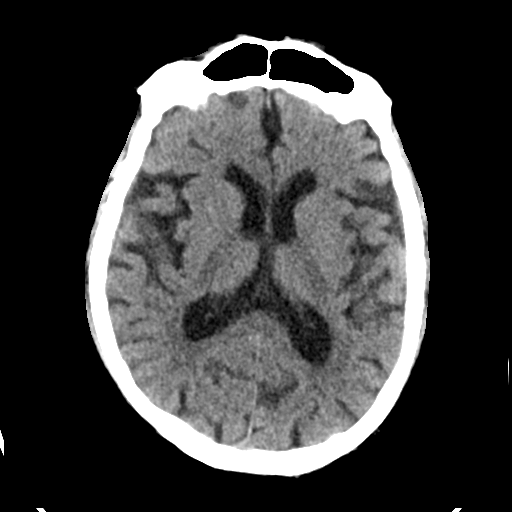
[im 35/35  brain]
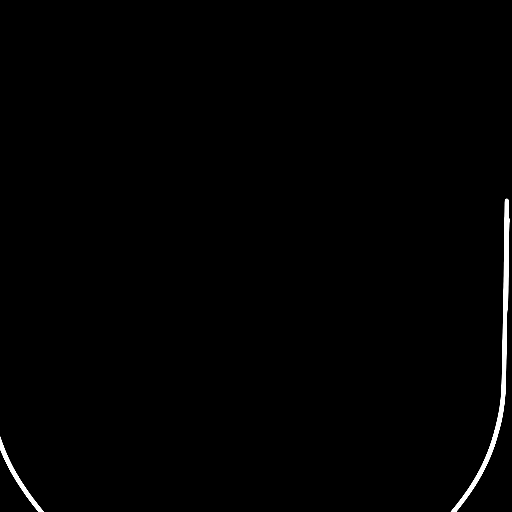

[Series 3: head bone · axial · 0.43mm/px · z∈[+1405,+1527]mm · 6 of 87 slices shown]
[im 13/87  bone]
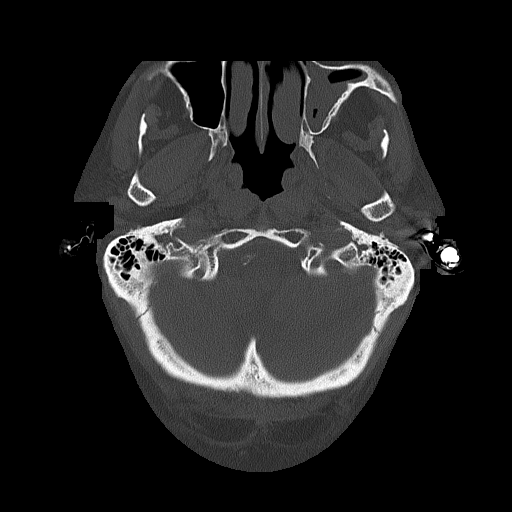
[im 25/87  bone]
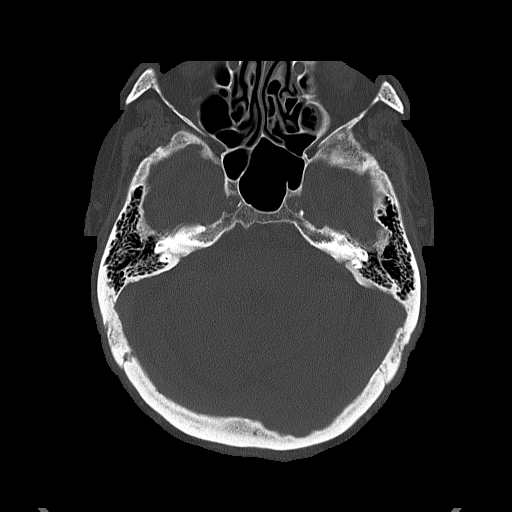
[im 37/87  bone]
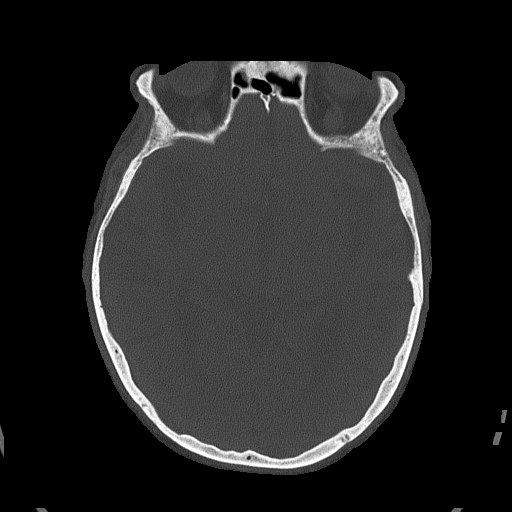
[im 50/87  bone]
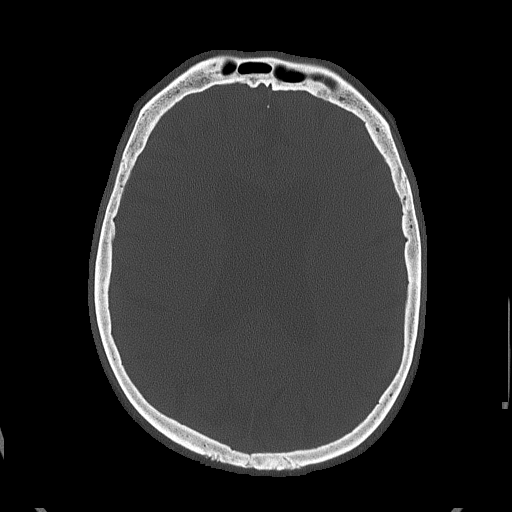
[im 62/87  bone]
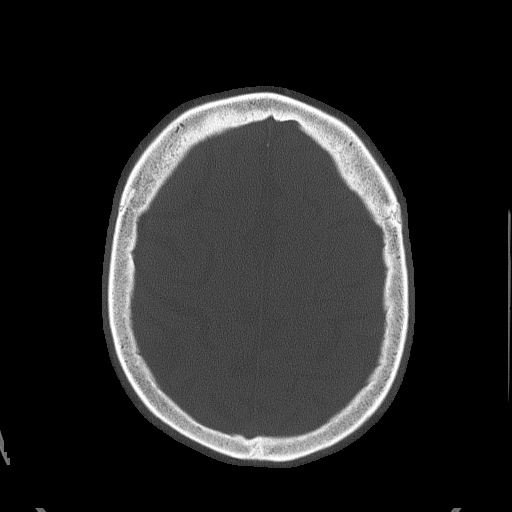
[im 74/87  bone]
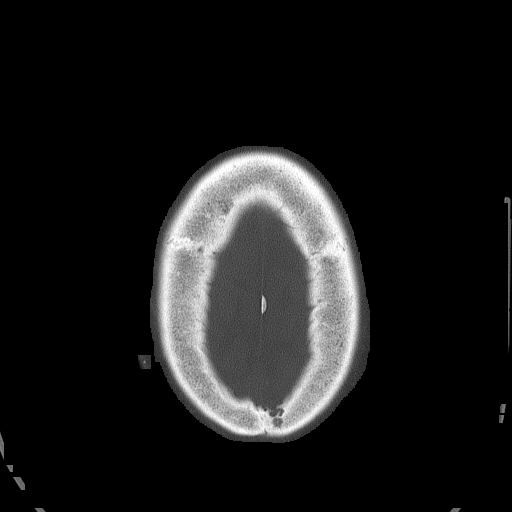

[Series 7: c_spine 2.0 sag bone · sagittal · 0.31mm/px · 3 of 61 slices shown]
[im 21/61  brain]
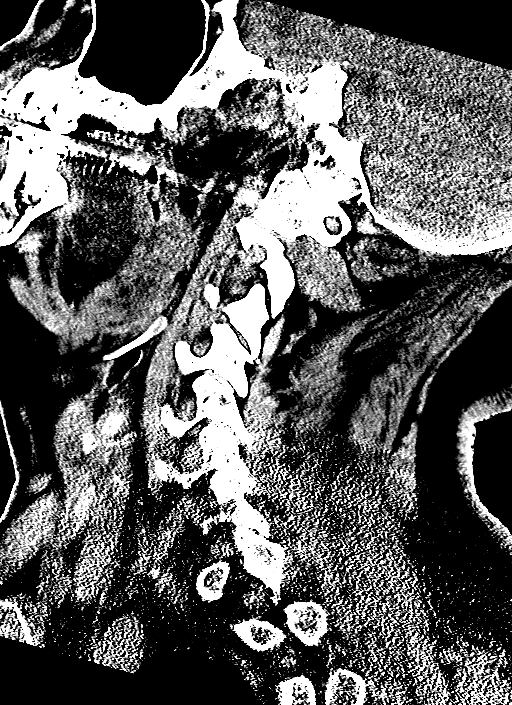
[im 31/61  brain]
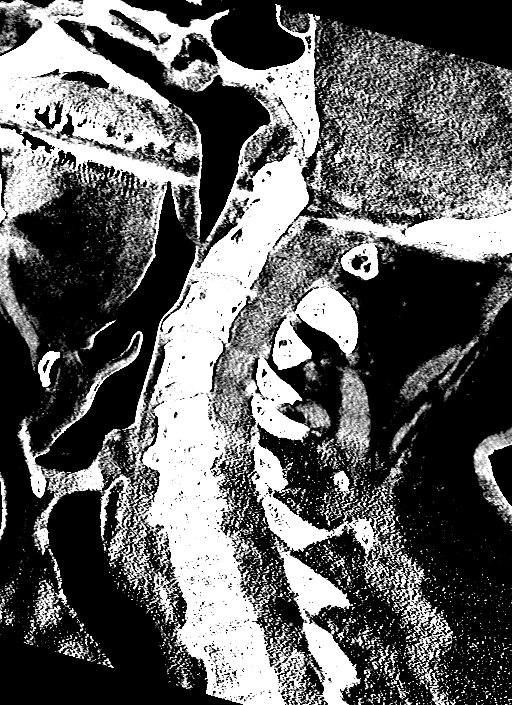
[im 41/61  brain]
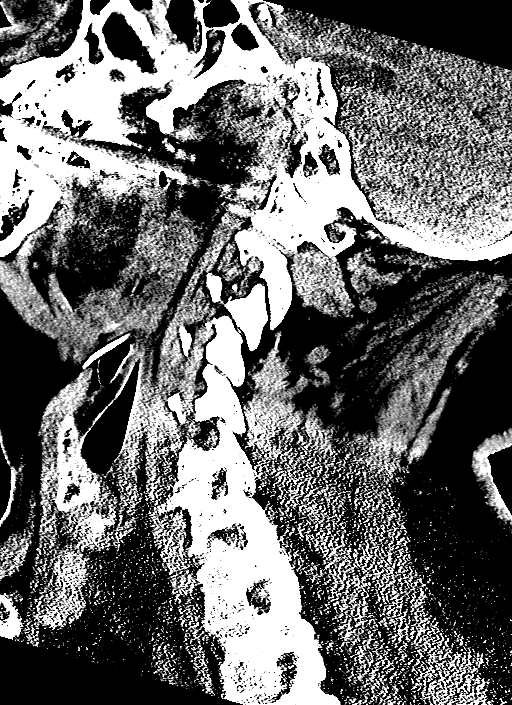

[Series 8: c_spine 2.0 cor bone · coronal · 0.31mm/px · 3 of 61 slices shown]
[im 21/61  brain]
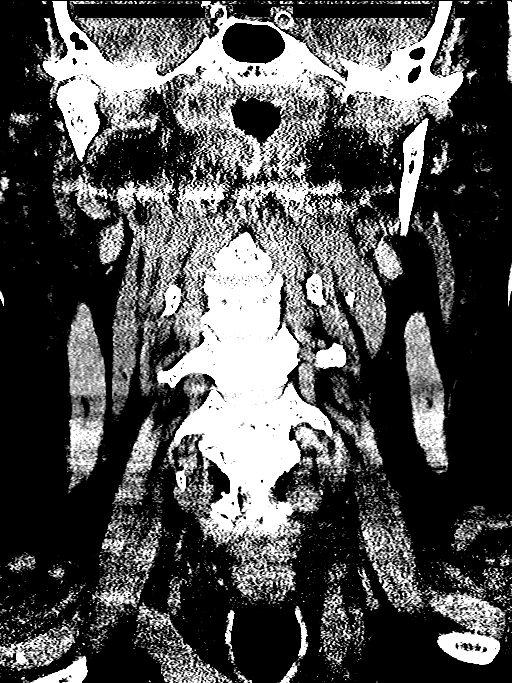
[im 27/61  brain]
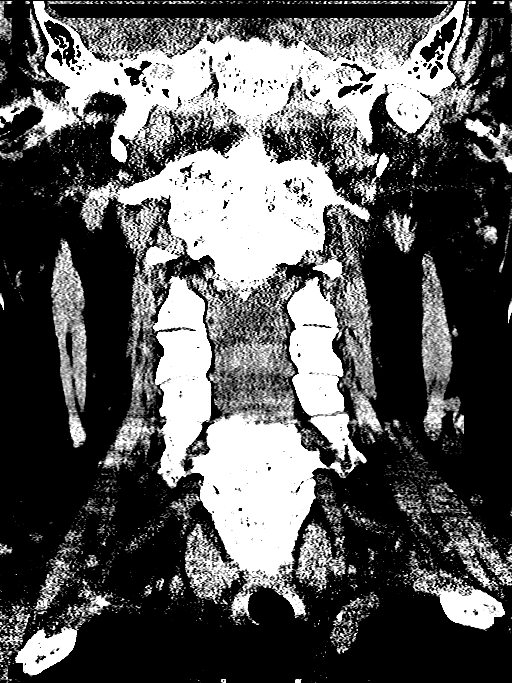
[im 34/61  brain]
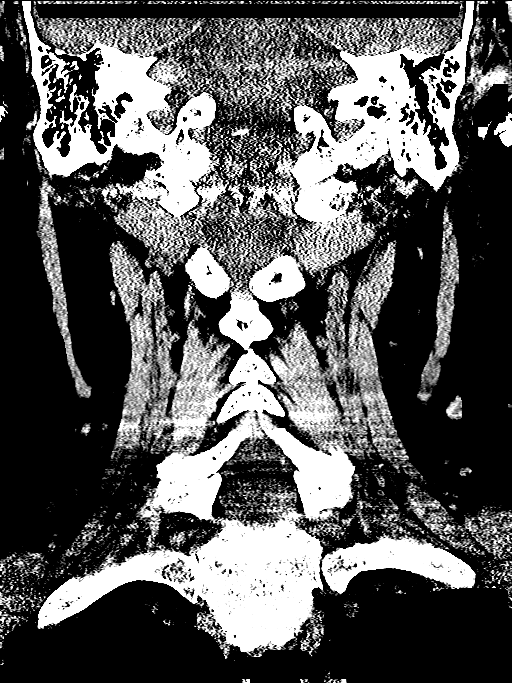

[15 of 47 positions shown; findings below may reference images not displayed]

FINDINGS: CT HEAD FINDINGS

There is mild diffuse atrophy. There is no intracranial mass,
hemorrhage, extra-axial fluid collection, or midline shift. There is
slight small vessel disease in the centra semiovale bilaterally.
Elsewhere, gray-white compartments appear normal. No acute infarct
evident. Bony calvarium appears intact. Mastoid air cells on the
right are clear. There are a few opacified inferior mastoid air
cells on the left. Most the mastoids on the left are clear. No
intraorbital lesions are evident. There is mucosal thickening in the
left maxillary antrum.

CT CERVICAL SPINE FINDINGS

There is no fracture or spondylolisthesis. Prevertebral soft tissues
and predental space regions are normal. There is severe disc space
narrowing at C5-6 and C6-7. There are anterior osteophytes at C5,
C6, and C7. There is mild calcification in the anterior ligament at
C3-4 and C4-5. There is facet hypertrophy at multiple levels. There
is exit foraminal narrowing at C5-6 on the right and at C6-7
bilaterally. There is calcification in both carotid arteries. There
is also calcification at scattered foci and vertebral arteries
bilaterally.
IMPRESSION: CT head: Mild atrophy with minimal periventricular small vessel
disease. No intracranial mass, hemorrhage, or extra-axial fluid
collection. No acute infarct evident. Mild inferior left-sided
mastoid air cell disease. Mastoids on the right are clear. Left
maxillary sinus disease noted.

CT cervical spine: No demonstrable fracture or spondylolisthesis.
Osteoarthritic change, most marked at C5-6 and C6-7. Scattered foci
of carotid and vertebral artery calcification bilaterally.

## 2017-02-02 ENCOUNTER — Encounter: Payer: Self-pay | Admitting: Family

## 2017-02-08 ENCOUNTER — Encounter: Payer: Self-pay | Admitting: Vascular Surgery

## 2017-02-08 ENCOUNTER — Ambulatory Visit (INDEPENDENT_AMBULATORY_CARE_PROVIDER_SITE_OTHER): Payer: Medicare HMO | Admitting: Vascular Surgery

## 2017-02-08 ENCOUNTER — Ambulatory Visit (HOSPITAL_COMMUNITY)
Admission: RE | Admit: 2017-02-08 | Discharge: 2017-02-08 | Disposition: A | Discharge status: Home / Self Care | Payer: Medicare HMO | Source: Ambulatory Visit | Attending: Vascular Surgery | Admitting: Vascular Surgery

## 2017-02-08 VITALS — BP 167/66 | HR 48 | Temp 97.2°F | Resp 20 | Ht 70.0 in | Wt 181.6 lb

## 2017-02-08 DIAGNOSIS — I6522 Occlusion and stenosis of left carotid artery: Secondary | ICD-10-CM | POA: Diagnosis not present

## 2017-02-08 DIAGNOSIS — I6523 Occlusion and stenosis of bilateral carotid arteries: Secondary | ICD-10-CM

## 2017-02-08 LAB — VAS US CAROTID
LCCADDIAS: 16 cm/s
LCCAPDIAS: 10 cm/s
LCCAPSYS: 108 cm/s
LEFT ECA DIAS: 0 cm/s
Left CCA dist sys: 110 cm/s
Left ICA dist dias: -28 cm/s
Left ICA dist sys: -101 cm/s
Left ICA prox dias: -75 cm/s
Left ICA prox sys: -335 cm/s
RCCADSYS: -79 cm/s
RIGHT CCA MID DIAS: 18 cm/s
RIGHT ECA DIAS: 0 cm/s
Right CCA prox dias: 8 cm/s
Right CCA prox sys: 71 cm/s

## 2017-02-08 NOTE — Progress Notes (Signed)
Patient name: Todd Cook MRN: [id number] DOB: 1941-04-29 Sex: male  REASON FOR CONSULT: Follow up carotid occlusive disease  HPI: Todd Cook is a 76 y.o. male status post right carotid endarterectomy 2000. He returns today for further follow-up regarding carotid occlusive disease. He has a known moderate left stenosis. He continues to deny any symptoms of TIA amaurosis or stroke. He also has a history of peripheral arterial disease and had a left below-knee amputation in 2012. He has no symptoms currently in his right leg. He does not use tobacco products. He is currently on Plavix aspirin and a statin.   Past Medical History:  Diagnosis Date  . Anemia   . Atrial fibrillation (HCC)    Coumadin deferred due to the need for Plavix and aspirin post DES for NSTEMI/PCI 11/12  . Carotid stenosis    Carotid Dopplers: RICA without significant stenosis; LICA 60-79%.  . Chronic kidney disease   . Coronary artery disease    Status post CABG in 2000; status post NSTEMI 11/12 -LHC 09/20/11: LM 20%, pLAD 70% and mid occluded, CFX and RCA occluded, oL-LAD 60%, pSVG-RCA 80% and dist 40%, pSVG-OM2 30%, pSVG-OM3 20%.  PCI: Promus DES to [facility]   . Diabetes mellitus   . Gout    left foot  . HLD (hyperlipidemia)   . Hypertension   . Ischemic cardiomyopathy    Echocardiogram 09/20/11: Mid to distal anterior, septal and apical akinesis, mild AI, mild MR, mild LAE, EF 35%.  . MI (myocardial infarction)   . 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 SOB (shortness of breath)    Past Surgical History:  Procedure Laterality Date  . AMPUTATION  09/26/2011   Procedure: AMPUTATION BELOW KNEE;  Surgeon: Sherren Kerns, MD;  Location: [facility] OR;  Service: Vascular;  Laterality: Left;  . CARDIAC CATHETERIZATION    . CAROTID ANGIOGRAM Bilateral 06/11/2014   Procedure: CAROTID ANGIOGRAM;  Surgeon: Fransisco Hertz, MD;  [facility];  Service: Cardiovascular;  Laterality:  Bilateral;  . CAROTID ENDARTERECTOMY Right 2000   CEA  . CATARACT EXTRACTION, BILATERAL    . CORONARY ARTERY BYPASS GRAFT  10 years ago  . EYE SURGERY    . KNEE ARTHROPLASTY     2000  . LEFT HEART CATHETERIZATION WITH CORONARY/GRAFT ANGIOGRAM N/A 09/20/2011   Procedure: LEFT HEART CATHETERIZATION WITH Isabel Caprice;  Surgeon: Iran Ouch, MD;  Location: [facility] CATH LAB;  Service: Cardiovascular;  Laterality: N/A;  . LOWER EXTREMITY ANGIOGRAM N/A 09/25/2011   Procedure: LOWER EXTREMITY ANGIOGRAM;  Surgeon: Kathleene Hazel, MD;  Location: [facility] CATH LAB;  Service: Cardiovascular;  Laterality: N/A;  . PERCUTANEOUS CORONARY STENT INTERVENTION (PCI-S) N/A 09/20/2011   Procedure: PERCUTANEOUS CORONARY STENT INTERVENTION (PCI-S);  Surgeon: Iran Ouch, MD;  Location: [facility] CATH LAB;  Service: Cardiovascular;  Laterality: N/A;    Family History  Problem Relation Age of Onset  . Heart disease Father     before age 41    SOCIAL HISTORY: Social History   Social History  . Marital status: Married    Spouse name: N/A  . Number of children: N/A  . Years of education: N/A   Occupational History  . Not on file.   Social History Main Topics  . Smoking status: Never Smoker  . Smokeless tobacco: Never Used  . Alcohol use No  . Drug use: No  . Sexual activity: No   Other Topics  Concern  . Not on file   Social History Narrative  . No narrative on file    Allergies  Allergen Reactions  . Ramipril Anaphylaxis    Current Outpatient Prescriptions  Medication Sig Dispense Refill  . allopurinol (ZYLOPRIM) 300 MG tablet Take 300 mg by mouth daily.      Marland Kitchen aspirin EC 81 MG tablet Take 81 mg by mouth at bedtime.     . clopidogrel (PLAVIX) 75 MG tablet Take 75 mg by mouth daily.     . colchicine 0.6 MG tablet Take 0.6 mg by mouth daily. COLCRYS    . ferrous sulfate (FERROUSUL) 325 (65 FE) MG tablet Take 325 mg by mouth at bedtime.     Marland Kitchen glimepiride (AMARYL) 4 MG tablet  Take 4 mg by mouth 2 (two) times daily.    . isosorbide mononitrate (IMDUR) 30 MG 24 hr tablet Take 1 tablet (30 mg total) by mouth daily. 90 tablet 3  . losartan (COZAAR) 100 MG tablet Take 100 mg by mouth daily.      . metFORMIN (GLUCOPHAGE) 850 MG tablet Take 850 mg by mouth 2 (two) times daily.     Marland Kitchen omeprazole (PRILOSEC) 20 MG capsule Take 20 mg by mouth daily.     . simvastatin (ZOCOR) 20 MG tablet Take 20 mg by mouth daily.     . sitaGLIPtin (JANUVIA) 100 MG tablet Take 100 mg by mouth at bedtime.     . metoprolol (LOPRESSOR) 50 MG tablet Take 1 tablet (50 mg total) by mouth 2 (two) times daily. 60 tablet 1   No current facility-administered medications for this visit.     ROS:   Cardiac: No recent episodes of chest pain/pressure, no shortness of breath at rest.  No shortness of breath with exertion.  Denies history of atrial fibrillation or irregular heartbeat  Pulmonary: No home oxygen, no productive cough, no hemoptysis,  No asthma or wheezing   Physical Examination  Vitals:   02/08/17 0913 02/08/17 0917  BP: (!) 180/64 (!) 167/66  Pulse: (!) 48   Resp: 20   Temp: 97.2 F (36.2 C)   TempSrc: Oral   SpO2: 98%   Weight: 181 lb 9.6 oz (82.4 kg)   Height: 5\' 10"  (1.778 m)     Body mass index is 26.06 kg/m.  General:  Alert and oriented, no acute distress HEENT: Normal Neck: Bilateral soft carotid bruits Pulmonary: Clear to auscultation bilaterally Cardiac: Regular Rate and Rhythm without murmur Skin: No rash Extremity Pulses:  Absent radial pulses bilaterally Musculoskeletal: No deformity or edema  Neurologic: Upper and lower extremity motor 5/5 and symmetric, left below-knee amputation  DATA:  Patient had a carotid duplex exam today which I reviewed and interpreted. This showed less than 40% right internal carotid artery stenosis, 60-80% left internal carotid artery stenosis antegrade vertebral flow  ASSESSMENT:  #1 moderate left carotid stenosis currently  asymptomatic follow-up carotid duplex scan 6 months continue Plavix aspirin statin.  Other chronic medical problems include diabetes hypertension elevated cholesterol and coronary artery disease all of these are currently stable. However, the patient's blood pressure was slightly elevated today. I encouraged him to keep a log of his blood pressure by checking this once daily and right indicating no blood. He will bring these numbers back with him at his next office visit. He will follow-up in 6 months.   PLAN:  See above   Fabienne Bruns, MD [facility] Office: [phone] Pager: [phone]

## 2017-02-08 NOTE — Addendum Note (Signed)
Addended by: Burton Apley A on: 02/08/2017 01:15 PM   Modules accepted: Orders

## 2017-02-15 ENCOUNTER — Telehealth: Payer: Self-pay | Admitting: Internal Medicine

## 2017-02-15 NOTE — Telephone Encounter (Signed)
New Message  Pt call requesting to speak with RN. Pt wife states pt is going to f/u with the vascular and vein speicalist and would like to know if pt would need to keep the f/u appt with Dr. Ladona Ridgel for 6/8 @ 11:45. Please call back to discuss

## 2017-02-16 NOTE — Telephone Encounter (Signed)
Follow Up Call    [name] is calling to follow up on her last phone call

## 2017-02-16 NOTE — Telephone Encounter (Signed)
Patient's wife is wondering if patient need to keep follow-up visit in June with Dr. Ladona Ridgel or can it be moved further out. Patient's wife stated patient's PCP Dr. Chilton Si is following patient very closely. Informed patient's wife that a message would be sent to Dr. Ladona Ridgel for his advisement.  Patient verbalized understanding and will look forward to a call back.

## 2017-02-19 NOTE — Telephone Encounter (Signed)
Push out clinic visit to October 2018. GT

## 2017-02-20 NOTE — Telephone Encounter (Signed)
Called patient and let him know that he will need to follow-up with Dr. Ladona Ridgel in October. Patient verbalized understanding and will give office a call when he receives recall letter.

## 2017-04-20 ENCOUNTER — Ambulatory Visit: Payer: Medicare HMO | Admitting: Internal Medicine

## 2017-08-16 ENCOUNTER — Encounter: Payer: Self-pay | Admitting: Vascular Surgery

## 2017-08-16 ENCOUNTER — Ambulatory Visit (HOSPITAL_COMMUNITY)
Admission: RE | Admit: 2017-08-16 | Discharge: 2017-08-16 | Disposition: A | Discharge status: Home / Self Care | Payer: Medicare HMO | Source: Ambulatory Visit | Attending: Vascular Surgery | Admitting: Vascular Surgery

## 2017-08-16 ENCOUNTER — Ambulatory Visit (INDEPENDENT_AMBULATORY_CARE_PROVIDER_SITE_OTHER): Payer: Medicare HMO | Admitting: Vascular Surgery

## 2017-08-16 VITALS — BP 132/62 | HR 62 | Resp 16 | Ht 67.75 in | Wt 185.3 lb

## 2017-08-16 DIAGNOSIS — I6522 Occlusion and stenosis of left carotid artery: Secondary | ICD-10-CM

## 2017-08-16 DIAGNOSIS — I6523 Occlusion and stenosis of bilateral carotid arteries: Secondary | ICD-10-CM | POA: Insufficient documentation

## 2017-08-16 LAB — VAS US CAROTID
LCCADDIAS: -20 cm/s
LCCADSYS: -132 cm/s
LCCAPDIAS: 19 cm/s
LCCAPSYS: 94 cm/s
LICAPDIAS: -66 cm/s
LICAPSYS: -211 cm/s
Left ICA dist dias: -40 cm/s
Left ICA dist sys: -156 cm/s
RCCAPSYS: 79 cm/s
RIGHT CCA MID DIAS: 14 cm/s
Right CCA prox dias: 13 cm/s
Right cca dist sys: -65 cm/s

## 2017-08-16 NOTE — Progress Notes (Signed)
Patient is a 76 year old male that we have been following for bilateral moderate carotid stenosis.  He previously underwent right carotid endarterectomy in 2000. He denies any symptoms of TIA amaurosis or stroke. He has also previously had a left below-knee amputation. He is currently on aspirin and Plavix and statin.  Other chronic medical problems remain atrial fibrillation, coronary artery disease, diabetes all of which have been stable  Review of systems: He denies any chest pain or shortness of breath.  Past Medical History:  Diagnosis Date  . Anemia   . Atrial fibrillation (HCC)    Coumadin deferred due to the need for Plavix and aspirin post DES for NSTEMI/PCI 11/12  . Carotid stenosis    Carotid Dopplers: RICA without significant stenosis; LICA 60-79%.  . Chronic kidney disease   . Coronary artery disease    Status post CABG in 2000; status post NSTEMI 11/12 -LHC 09/20/11: LM 20%, pLAD 70% and mid occluded, CFX and RCA occluded, oL-LAD 60%, pSVG-RCA 80% and dist 40%, pSVG-OM2 30%, pSVG-OM3 20%.  PCI: Promus DES to [facility]   . Diabetes mellitus   . Gout    left foot  . HLD (hyperlipidemia)   . Hypertension   . Ischemic cardiomyopathy    Echocardiogram 09/20/11: Mid to distal anterior, septal and apical akinesis, mild AI, mild MR, mild LAE, EF 35%.  . MI (myocardial infarction) (HCC)   . 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 SOB (shortness of breath)    Current Outpatient Prescriptions on File Prior to Visit  Medication Sig Dispense Refill  . allopurinol (ZYLOPRIM) 300 MG tablet Take 300 mg by mouth daily.      Marland Kitchen aspirin EC 81 MG tablet Take 81 mg by mouth at bedtime.     . clopidogrel (PLAVIX) 75 MG tablet Take 75 mg by mouth daily.     . colchicine 0.6 MG tablet Take 0.6 mg by mouth daily. COLCRYS    . ferrous sulfate (FERROUSUL) 325 (65 FE) MG tablet Take 325 mg by mouth at bedtime.     Marland Kitchen glimepiride (AMARYL) 4 MG tablet Take 4 mg by mouth 2 (two)  times daily.    Marland Kitchen losartan (COZAAR) 100 MG tablet Take 100 mg by mouth daily.      . metFORMIN (GLUCOPHAGE) 850 MG tablet Take 850 mg by mouth 2 (two) times daily.     Marland Kitchen omeprazole (PRILOSEC) 20 MG capsule Take 20 mg by mouth daily.     . simvastatin (ZOCOR) 20 MG tablet Take 20 mg by mouth daily.     . sitaGLIPtin (JANUVIA) 100 MG tablet Take 100 mg by mouth at bedtime.     . isosorbide mononitrate (IMDUR) 30 MG 24 hr tablet Take 1 tablet (30 mg total) by mouth daily. 90 tablet 3  . metoprolol (LOPRESSOR) 50 MG tablet Take 1 tablet (50 mg total) by mouth 2 (two) times daily. 60 tablet 1   No current facility-administered medications on file prior to visit.     Physical exam:  Vitals:   08/16/17 1143  BP: 132/62  Pulse: 62  Resp: 16  SpO2: 99%  Weight: 185 lb 4.8 oz (84.1 kg)  Height: 5' 7.75" (1.721 m)    Neck: Right side carotid bruit no left bruit  Chest: Clear to auscultation bilaterally  Cardiac: Regular rate and rhythm without murmur  Abdomen: Soft nontender nondistended  Extremities: 2+ femoral pulses bilaterally  Data: Patient had a carotid duplex  exam today. This showed 4060% right internal carotid artery stenosis 60-80% left internal carotid artery stenosis. This was minimally changed from his last office visit March 2018.  Assessment: Asymptomatic moderate carotid stenosis.  Plan: Continue aspirin and Plavix. The patient will have a follow-up carotid duplex scan in 6 months time.  He will return sooner if he develops any symptoms of stroke or TIA or amaurosis.  Fabienne Bruns, MD [facility] Office: [phone] Pager: [phone]

## 2017-08-17 NOTE — Addendum Note (Signed)
Addended by: Burton Apley A on: 08/17/2017 04:21 PM   Modules accepted: Orders

## 2017-09-05 ENCOUNTER — Encounter: Payer: Self-pay | Admitting: Internal Medicine

## 2017-09-05 ENCOUNTER — Ambulatory Visit (INDEPENDENT_AMBULATORY_CARE_PROVIDER_SITE_OTHER): Payer: Medicare HMO | Admitting: Internal Medicine

## 2017-09-05 VITALS — BP 118/64 | HR 74 | Ht 70.0 in | Wt 182.0 lb

## 2017-09-05 DIAGNOSIS — I255 Ischemic cardiomyopathy: Secondary | ICD-10-CM | POA: Diagnosis not present

## 2017-09-05 DIAGNOSIS — I25709 Atherosclerosis of coronary artery bypass graft(s), unspecified, with unspecified angina pectoris: Secondary | ICD-10-CM | POA: Diagnosis not present

## 2017-09-05 LAB — CBC WITH DIFFERENTIAL/PLATELET
BASOS: 0 %
Basophils Absolute: 0 10*3/uL (ref 0.0–0.2)
EOS (ABSOLUTE): 0.1 10*3/uL (ref 0.0–0.4)
EOS: 1 %
HEMATOCRIT: 33.4 % — AB (ref 37.5–51.0)
HEMOGLOBIN: 11.2 g/dL — AB (ref 13.0–17.7)
Immature Grans (Abs): 0.1 10*3/uL (ref 0.0–0.1)
Immature Granulocytes: 1 %
LYMPHS ABS: 1.8 10*3/uL (ref 0.7–3.1)
Lymphs: 18 %
MCH: 30.6 pg (ref 26.6–33.0)
MCHC: 33.5 g/dL (ref 31.5–35.7)
MCV: 91 fL (ref 79–97)
MONOCYTES: 8 %
Monocytes Absolute: 0.8 10*3/uL (ref 0.1–0.9)
Neutrophils Absolute: 7.2 10*3/uL — ABNORMAL HIGH (ref 1.4–7.0)
Neutrophils: 72 %
Platelets: 163 10*3/uL (ref 150–379)
RBC: 3.66 x10E6/uL — AB (ref 4.14–5.80)
RDW: 15 % (ref 12.3–15.4)
WBC: 9.8 10*3/uL (ref 3.4–10.8)

## 2017-09-05 LAB — BASIC METABOLIC PANEL
BUN / CREAT RATIO: 20 (ref 10–24)
BUN: 36 mg/dL — AB (ref 8–27)
CO2: 20 mmol/L (ref 20–29)
CREATININE: 1.8 mg/dL — AB (ref 0.76–1.27)
Calcium: 9.5 mg/dL (ref 8.6–10.2)
Chloride: 100 mmol/L (ref 96–106)
GFR, EST AFRICAN AMERICAN: 41 mL/min/{1.73_m2} — AB (ref >59)
GFR, EST NON AFRICAN AMERICAN: 35 mL/min/{1.73_m2} — AB (ref >59)
Glucose: 253 mg/dL — ABNORMAL HIGH (ref 65–99)
Potassium: 5.2 mmol/L (ref 3.5–5.2)
Sodium: 137 mmol/L (ref 134–144)

## 2017-09-05 LAB — PROTIME-INR
INR: 1.1 (ref 0.8–1.2)
PROTHROMBIN TIME: 11.2 s (ref 9.1–12.0)

## 2017-09-05 NOTE — Progress Notes (Signed)
HPI [name] returns today for ongoing evaluation and treatment of coronary artery disease, carotid vascular disease, and hypertension. I saw him approximately 8 months ago and he had worsening anginal symptoms. He underwent stress testing which was abnormal. He was appropriately placed on additional anti-anginal therapy with Imdur. The patient has chronic renal insufficiency. His most recent creatinine was in the 1.5 range. Over the last few weeks, he notes progression of his anginal symptoms. He states that when he walks across the room, he gets bilateral arm and shoulder pain as well as substernal chest pressure. His symptoms resolved with rest. He denies medical noncompliance. He has not had syncope.  Allergies  Allergen Reactions  . Ramipril Anaphylaxis     Current Outpatient Prescriptions  Medication Sig Dispense Refill  . allopurinol (ZYLOPRIM) 300 MG tablet Take 300 mg by mouth daily.      Marland Kitchen. aspirin EC 81 MG tablet Take 81 mg by mouth at bedtime.     . clopidogrel (PLAVIX) 75 MG tablet Take 75 mg by mouth daily.     . colchicine 0.6 MG tablet Take 0.6 mg by mouth daily. COLCRYS    . ferrous sulfate (FERROUSUL) 325 (65 FE) MG tablet Take 325 mg by mouth at bedtime.     Marland Kitchen. glimepiride (AMARYL) 4 MG tablet Take 4 mg by mouth 2 (two) times daily.    . isosorbide mononitrate (IMDUR) 30 MG 24 hr tablet Take 1 tablet (30 mg total) by mouth daily. 90 tablet 3  . losartan (COZAAR) 100 MG tablet Take 100 mg by mouth daily.      . metFORMIN (GLUCOPHAGE) 850 MG tablet Take 850 mg by mouth 2 (two) times daily.     . metoprolol (LOPRESSOR) 50 MG tablet Take 1 tablet (50 mg total) by mouth 2 (two) times daily. 60 tablet 1  . omeprazole (PRILOSEC) 20 MG capsule Take 20 mg by mouth daily.     . simvastatin (ZOCOR) 20 MG tablet Take 20 mg by mouth daily.     . sitaGLIPtin (JANUVIA) 100 MG tablet Take 100 mg by mouth at bedtime.      No current facility-administered medications for this  visit.      Past Medical History:  Diagnosis Date  . Anemia   . Atrial fibrillation (HCC)    Coumadin deferred due to the need for Plavix and aspirin post DES for NSTEMI/PCI 11/12  . Cardiomyopathy, ischemic 09/20/2011  . Carotid artery stenosis without cerebral infarction 06/01/2014  . Carotid stenosis    Carotid Dopplers: RICA without significant stenosis; LICA 60-79%.  . Chronic kidney disease   . Coronary artery disease    Status post CABG in 2000; status post NSTEMI 11/12 -LHC 09/20/11: LM 20%, pLAD 70% and mid occluded, CFX and RCA occluded, oL-LAD 60%, pSVG-RCA 80% and dist 40%, pSVG-OM2 30%, pSVG-OM3 20%.  PCI: Promus DES to [facility]-RCA   . Coronary atherosclerosis of artery bypass graft 10/24/2011  . Diabetes mellitus   . Diverticulosis of colon 09/18/2011   Had Endoscopy 2005 With Elwin SleightKaplan Greer  ENDOSCOPIC IMPRESSION:    1) Diverticula in the mid transverse colon     2) Mild diverticulosis in the sigmoid colon    . DM (diabetes mellitus) type I uncontrolled, periph vascular disorder (HCC) 09/18/2011  . Gout    left foot  . HLD (hyperlipidemia)   . Hyperlipidemia 10/24/2011  . Hypertension   . Ischemic cardiomyopathy    Echocardiogram 09/20/11: Mid to distal  anterior, septal and apical akinesis, mild AI, mild MR, mild LAE, EF 35%.  . MI (myocardial infarction) (HCC)   . Nephrolithiasis 09/18/2011  . Non-STEMI (non-ST elevated myocardial infarction) (HCC) 09/18/2011  . Osteomyelitis (HCC) 09/18/2011  . 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 PVD (peripheral vascular disease) (HCC) 09/29/2011  . SOB (shortness of breath)     ROS:   All systems reviewed and negative except as noted in the HPI.   Past Surgical History:  Procedure Laterality Date  . AMPUTATION  09/26/2011   Procedure: AMPUTATION BELOW KNEE;  Surgeon: Sherren Kerns, MD;  Location: [facility] OR;  Service: Vascular;  Laterality: Left;  . CARDIAC CATHETERIZATION    . CAROTID ANGIOGRAM  Bilateral 06/11/2014   Procedure: CAROTID ANGIOGRAM;  Surgeon: Fransisco Hertz, MD;  Location: [facility] CATH LAB;  Service: Cardiovascular;  Laterality: Bilateral;  . CAROTID ENDARTERECTOMY Right 2000   CEA  . CATARACT EXTRACTION, BILATERAL    . CORONARY ARTERY BYPASS GRAFT  10 years ago  . EYE SURGERY    . KNEE ARTHROPLASTY     2000  . LEFT HEART CATHETERIZATION WITH CORONARY/GRAFT ANGIOGRAM N/A 09/20/2011   Procedure: LEFT HEART CATHETERIZATION WITH Isabel Caprice;  Surgeon: Iran Ouch, MD;  Location: [facility] CATH LAB;  Service: Cardiovascular;  Laterality: N/A;  . LOWER EXTREMITY ANGIOGRAM N/A 09/25/2011   Procedure: LOWER EXTREMITY ANGIOGRAM;  Surgeon: Kathleene Hazel, MD;  Location: [facility] CATH LAB;  Service: Cardiovascular;  Laterality: N/A;  . PERCUTANEOUS CORONARY STENT INTERVENTION (PCI-S) N/A 09/20/2011   Procedure: PERCUTANEOUS CORONARY STENT INTERVENTION (PCI-S);  Surgeon: Iran Ouch, MD;  Location: [facility] CATH LAB;  Service: Cardiovascular;  Laterality: N/A;     Family History  Problem Relation Age of Onset  . Heart disease Father        before age 63     Social History   Social History  . Marital status: Married    Spouse name: N/A  . Number of children: N/A  . Years of education: N/A   Occupational History  . Not on file.   Social History Main Topics  . Smoking status: Never Smoker  . Smokeless tobacco: Never Used  . Alcohol use No  . Drug use: No  . Sexual activity: No   Other Topics Concern  . Not on file   Social History Narrative  . No narrative on file     BP 118/64   Pulse 74   Ht 5\' 10"  (1.778 m)   Wt 182 lb (82.6 kg)   BMI 26.11 kg/m   Physical Exam:  Well appearing  76 year old man, NAD HEENT: Unremarkable Neck:  6 cm  JVD, no thyromegally Lymphatics:  No adenopathy Back:  No CVA tenderness Lungs:  Clear, With no wheezes, rales, or rhonchi. HEART:  Regular rate rhythm, no murmurs, no rubs, no clicks Abd:  soft, positive  bowel sounds, no organomegally, no rebound, no guarding Ext:  2 plus pulses, no edema, no cyanosis, no clubbing Skin:  No rashes no nodules Neuro:  CN II through XII intact, motor grossly intact  EKG - normal sinus rhythm with inferior and lateral ST-T wave changes, worrisome for ischemia. There is also old anteroseptal myocardial infarction.   Assess/Plan: 1. Crescendo angina - the patient has had an increase in his anginal symptoms. His blood pressure and heart rate are well controlled and we have no room to add additional anti-anginal medications at this time.  I have suggested he undergo cardiac catheterization based on his anginal symptoms and prior abnormal stress test. He is on essentially maximal medical therapy. Ranolazine would be a consideration but I would suggest this only if revascularization was not an option. 2. Carotid vascular disease - he will follow-up with Dr. Darrick Penna as scheduled. He currently has no symptoms. 3. Dyslipidemia - he is on simvastatin. We will plan to obtain a fasting lipid panel in the future.  Lewayne Bunting, M.D.

## 2017-09-05 NOTE — Patient Instructions (Addendum)
Medication Instructions:  Your physician recommends that you continue on your current medications as directed. Please refer to the Current Medication list given to you today.  Labwork: You will get a BMP, CBC and PT/INR.  Testing/Procedures; None ordered.  Follow-Up:  To be determined.   Any Other Special Instructions Will Be Listed Below (If Applicable).    If you need a refill on your cardiac medications before your next appointment, please call your pharmacy.

## 2017-09-07 ENCOUNTER — Telehealth: Payer: Self-pay | Admitting: Internal Medicine

## 2017-09-07 NOTE — Telephone Encounter (Signed)
New message     Pt c/o Shortness Of Breath: STAT if SOB developed within the last 24 hours or pt is noticeably SOB on the phone  1. Are you currently SOB (can you hear that pt is SOB on the phone)? Wife on phone   2. How long have you been experiencing SOB? Yesterday it is severe  3. Are you SOB when sitting or when up moving around? With exertion , weakness and stomach pain    4. Are you currently experiencing any other symptoms? Seen taylor Wednesday, then yesterday it got to be severe   Have not checked vitals

## 2017-09-07 NOTE — Telephone Encounter (Signed)
Call returned to wife.  Per wife Pt has been increasingly sob.  Pt has increased sob with any ambulation.  Resolves when Pt is sitting.  Per wife need to get Pt in for cath sooner rather than later.  First upcoming available with Dr. Katrinka Blazing 09/14/2017.  Wife agreeable.  Scheduled for 1;30 pm, Pt to arrive 4 hours early for IV hydration for Cr 1.8.  Wife indicates understanding.  Notified I would call her Monday with addl directions.

## 2017-09-10 NOTE — Telephone Encounter (Signed)
Patient contacted pre-catheterization at [facility] scheduled for:  11/14/2016 @ 1330 Verified arrival time and place:  NT @ 0900-Pt arriving early for hydration Confirmed AM meds to be taken pre-cath with sip of water: Take ASA, Plavix, metoprolol Last dose metformin Thurs AM prior to procedure Hold Amaryl, Januvia am of Confirmed patient has responsible person to drive home post procedure and observe patient for 24 hours:  yes Addl concerns:  Pt with Cr 1.8.

## 2017-09-12 ENCOUNTER — Ambulatory Visit: Payer: Medicare HMO | Admitting: Cardiovascular Disease

## 2017-09-14 ENCOUNTER — Inpatient Hospital Stay (HOSPITAL_COMMUNITY)
Admission: AD | Admit: 2017-09-14 | Discharge: 2017-09-18 | DRG: 286 | Disposition: A | Discharge status: Skilled Nursing Facility | Payer: Medicare HMO | Source: Ambulatory Visit | Attending: Interventional Cardiology | Admitting: Interventional Cardiology

## 2017-09-14 ENCOUNTER — Encounter (HOSPITAL_COMMUNITY)
Admission: AD | Disposition: A | Discharge status: Skilled Nursing Facility | Payer: Self-pay | Source: Ambulatory Visit | Attending: Interventional Cardiology

## 2017-09-14 ENCOUNTER — Encounter (HOSPITAL_COMMUNITY): Payer: Self-pay

## 2017-09-14 DIAGNOSIS — I252 Old myocardial infarction: Secondary | ICD-10-CM | POA: Diagnosis not present

## 2017-09-14 DIAGNOSIS — M109 Gout, unspecified: Secondary | ICD-10-CM | POA: Diagnosis present

## 2017-09-14 DIAGNOSIS — N184 Chronic kidney disease, stage 4 (severe): Secondary | ICD-10-CM | POA: Diagnosis present

## 2017-09-14 DIAGNOSIS — Z7902 Long term (current) use of antithrombotics/antiplatelets: Secondary | ICD-10-CM | POA: Diagnosis not present

## 2017-09-14 DIAGNOSIS — L8990 Pressure ulcer of unspecified site, unspecified stage: Secondary | ICD-10-CM | POA: Insufficient documentation

## 2017-09-14 DIAGNOSIS — I2510 Atherosclerotic heart disease of native coronary artery without angina pectoris: Secondary | ICD-10-CM

## 2017-09-14 DIAGNOSIS — Z8249 Family history of ischemic heart disease and other diseases of the circulatory system: Secondary | ICD-10-CM | POA: Diagnosis not present

## 2017-09-14 DIAGNOSIS — Z9842 Cataract extraction status, left eye: Secondary | ICD-10-CM | POA: Diagnosis not present

## 2017-09-14 DIAGNOSIS — I5043 Acute on chronic combined systolic (congestive) and diastolic (congestive) heart failure: Secondary | ICD-10-CM | POA: Diagnosis present

## 2017-09-14 DIAGNOSIS — I255 Ischemic cardiomyopathy: Secondary | ICD-10-CM | POA: Diagnosis present

## 2017-09-14 DIAGNOSIS — E1051 Type 1 diabetes mellitus with diabetic peripheral angiopathy without gangrene: Secondary | ICD-10-CM | POA: Diagnosis present

## 2017-09-14 DIAGNOSIS — I25110 Atherosclerotic heart disease of native coronary artery with unstable angina pectoris: Secondary | ICD-10-CM | POA: Diagnosis present

## 2017-09-14 DIAGNOSIS — I25810 Atherosclerosis of coronary artery bypass graft(s) without angina pectoris: Secondary | ICD-10-CM | POA: Diagnosis present

## 2017-09-14 DIAGNOSIS — Z955 Presence of coronary angioplasty implant and graft: Secondary | ICD-10-CM | POA: Diagnosis not present

## 2017-09-14 DIAGNOSIS — Z7982 Long term (current) use of aspirin: Secondary | ICD-10-CM

## 2017-09-14 DIAGNOSIS — E785 Hyperlipidemia, unspecified: Secondary | ICD-10-CM | POA: Diagnosis present

## 2017-09-14 DIAGNOSIS — Z89512 Acquired absence of left leg below knee: Secondary | ICD-10-CM | POA: Diagnosis not present

## 2017-09-14 DIAGNOSIS — I509 Heart failure, unspecified: Secondary | ICD-10-CM

## 2017-09-14 DIAGNOSIS — E1022 Type 1 diabetes mellitus with diabetic chronic kidney disease: Secondary | ICD-10-CM | POA: Diagnosis present

## 2017-09-14 DIAGNOSIS — R079 Chest pain, unspecified: Secondary | ICD-10-CM | POA: Diagnosis present

## 2017-09-14 DIAGNOSIS — Z9889 Other specified postprocedural states: Secondary | ICD-10-CM | POA: Diagnosis not present

## 2017-09-14 DIAGNOSIS — I5022 Chronic systolic (congestive) heart failure: Secondary | ICD-10-CM | POA: Diagnosis not present

## 2017-09-14 DIAGNOSIS — I10 Essential (primary) hypertension: Secondary | ICD-10-CM | POA: Diagnosis present

## 2017-09-14 DIAGNOSIS — Z951 Presence of aortocoronary bypass graft: Secondary | ICD-10-CM | POA: Diagnosis not present

## 2017-09-14 DIAGNOSIS — N179 Acute kidney failure, unspecified: Secondary | ICD-10-CM | POA: Diagnosis present

## 2017-09-14 DIAGNOSIS — K5730 Diverticulosis of large intestine without perforation or abscess without bleeding: Secondary | ICD-10-CM | POA: Diagnosis present

## 2017-09-14 DIAGNOSIS — I5023 Acute on chronic systolic (congestive) heart failure: Secondary | ICD-10-CM

## 2017-09-14 DIAGNOSIS — E1065 Type 1 diabetes mellitus with hyperglycemia: Secondary | ICD-10-CM

## 2017-09-14 DIAGNOSIS — Z888 Allergy status to other drugs, medicaments and biological substances status: Secondary | ICD-10-CM | POA: Diagnosis not present

## 2017-09-14 DIAGNOSIS — Z9841 Cataract extraction status, right eye: Secondary | ICD-10-CM

## 2017-09-14 DIAGNOSIS — E875 Hyperkalemia: Secondary | ICD-10-CM | POA: Diagnosis present

## 2017-09-14 DIAGNOSIS — I200 Unstable angina: Secondary | ICD-10-CM | POA: Diagnosis not present

## 2017-09-14 DIAGNOSIS — I130 Hypertensive heart and chronic kidney disease with heart failure and stage 1 through stage 4 chronic kidney disease, or unspecified chronic kidney disease: Principal | ICD-10-CM | POA: Diagnosis present

## 2017-09-14 DIAGNOSIS — I480 Paroxysmal atrial fibrillation: Secondary | ICD-10-CM | POA: Diagnosis present

## 2017-09-14 DIAGNOSIS — IMO0002 Reserved for concepts with insufficient information to code with codable children: Secondary | ICD-10-CM | POA: Diagnosis present

## 2017-09-14 DIAGNOSIS — I351 Nonrheumatic aortic (valve) insufficiency: Secondary | ICD-10-CM | POA: Diagnosis not present

## 2017-09-14 HISTORY — PX: LEFT HEART CATH AND CORS/GRAFTS ANGIOGRAPHY: CATH118250

## 2017-09-14 LAB — CBC
HEMATOCRIT: 35.8 % — AB (ref 39.0–52.0)
HEMOGLOBIN: 11.7 g/dL — AB (ref 13.0–17.0)
MCH: 30.5 pg (ref 26.0–34.0)
MCHC: 32.7 g/dL (ref 30.0–36.0)
MCV: 93.2 fL (ref 78.0–100.0)
Platelets: 165 10*3/uL (ref 150–400)
RBC: 3.84 MIL/uL — AB (ref 4.22–5.81)
RDW: 15.5 % (ref 11.5–15.5)
WBC: 9.2 10*3/uL (ref 4.0–10.5)

## 2017-09-14 LAB — BASIC METABOLIC PANEL
Anion gap: 8 (ref 5–15)
BUN: 40 mg/dL — AB (ref 6–20)
CALCIUM: 8.8 mg/dL — AB (ref 8.9–10.3)
CO2: 20 mmol/L — ABNORMAL LOW (ref 22–32)
CREATININE: 2.09 mg/dL — AB (ref 0.61–1.24)
Chloride: 106 mmol/L (ref 101–111)
GFR calc Af Amer: 33 mL/min — ABNORMAL LOW (ref >60)
GFR, EST NON AFRICAN AMERICAN: 29 mL/min — AB (ref >60)
Glucose, Bld: 308 mg/dL — ABNORMAL HIGH (ref 65–99)
Potassium: 5 mmol/L (ref 3.5–5.1)
SODIUM: 134 mmol/L — AB (ref 135–145)

## 2017-09-14 LAB — CREATININE, SERUM
Creatinine, Ser: 1.95 mg/dL — ABNORMAL HIGH (ref 0.61–1.24)
GFR, EST AFRICAN AMERICAN: 36 mL/min — AB (ref >60)
GFR, EST NON AFRICAN AMERICAN: 31 mL/min — AB (ref >60)

## 2017-09-14 LAB — GLUCOSE, CAPILLARY
GLUCOSE-CAPILLARY: 268 mg/dL — AB (ref 65–99)
GLUCOSE-CAPILLARY: 226 mg/dL — AB (ref 65–99)
Glucose-Capillary: 291 mg/dL — ABNORMAL HIGH (ref 65–99)

## 2017-09-14 SURGERY — LEFT HEART CATH AND CORS/GRAFTS ANGIOGRAPHY
Anesthesia: LOCAL

## 2017-09-14 MED ORDER — SODIUM CHLORIDE 0.9 % IV SOLN: 250.0000 mL | INTRAVENOUS | Status: DC | PRN

## 2017-09-14 MED ORDER — SODIUM CHLORIDE 0.9% FLUSH: 3.0000 mL | Freq: Two times a day (BID) | INTRAVENOUS | Status: DC

## 2017-09-14 MED ORDER — FERROUS SULFATE 325 (65 FE) MG PO TABS
325.0000 mg | ORAL_TABLET | Freq: Every day | ORAL | Status: DC
  Administered 2017-09-14 – 2017-09-17 (×4): 325 mg via ORAL
  Filled 2017-09-14 (×4): qty 1

## 2017-09-14 MED ORDER — SODIUM CHLORIDE 0.9% FLUSH: 3.0000 mL | INTRAVENOUS | Status: DC | PRN

## 2017-09-14 MED ORDER — CLOPIDOGREL BISULFATE 75 MG PO TABS: 75.0000 mg | ORAL_TABLET | ORAL | Status: DC

## 2017-09-14 MED ORDER — SODIUM CHLORIDE 0.9 % WEIGHT BASED INFUSION: 1.0000 mL/kg/h | INTRAVENOUS | Status: DC

## 2017-09-14 MED ORDER — HEPARIN (PORCINE) IN NACL 2-0.9 UNIT/ML-% IJ SOLN
INTRAMUSCULAR | Status: AC | PRN
  Administered 2017-09-14: 1000 mL

## 2017-09-14 MED ORDER — LINAGLIPTIN 5 MG PO TABS
5.0000 mg | ORAL_TABLET | Freq: Every day | ORAL | Status: DC
  Administered 2017-09-15 – 2017-09-18 (×4): 5 mg via ORAL
  Filled 2017-09-14 (×4): qty 1

## 2017-09-14 MED ORDER — IOPAMIDOL (ISOVUE-370) INJECTION 76%
INTRAVENOUS | Status: DC | PRN
  Administered 2017-09-14: 90 mL via INTRA_ARTERIAL

## 2017-09-14 MED ORDER — COLCHICINE 0.6 MG PO TABS
0.6000 mg | ORAL_TABLET | Freq: Every day | ORAL | Status: DC
  Administered 2017-09-15 – 2017-09-18 (×4): 0.6 mg via ORAL
  Filled 2017-09-14 (×4): qty 1

## 2017-09-14 MED ORDER — LIDOCAINE HCL (PF) 1 % IJ SOLN
INTRAMUSCULAR | Status: AC
  Filled 2017-09-14: qty 30

## 2017-09-14 MED ORDER — HEPARIN SODIUM (PORCINE) 5000 UNIT/ML IJ SOLN
5000.0000 [IU] | Freq: Three times a day (TID) | INTRAMUSCULAR | Status: DC
  Administered 2017-09-14 – 2017-09-18 (×11): 5000 [IU] via SUBCUTANEOUS
  Filled 2017-09-14 (×10): qty 1

## 2017-09-14 MED ORDER — SODIUM CHLORIDE 0.9 % WEIGHT BASED INFUSION: 0.6000 mL/kg/h | INTRAVENOUS | Status: AC

## 2017-09-14 MED ORDER — CLOPIDOGREL BISULFATE 75 MG PO TABS
75.0000 mg | ORAL_TABLET | Freq: Every day | ORAL | Status: DC
  Administered 2017-09-15 – 2017-09-18 (×4): 75 mg via ORAL
  Filled 2017-09-14 (×4): qty 1

## 2017-09-14 MED ORDER — LIDOCAINE HCL (PF) 1 % IJ SOLN
INTRAMUSCULAR | Status: DC | PRN
  Administered 2017-09-14: 15 mL via INTRADERMAL

## 2017-09-14 MED ORDER — ASPIRIN 81 MG PO CHEW: 81.0000 mg | CHEWABLE_TABLET | ORAL | Status: DC

## 2017-09-14 MED ORDER — SODIUM CHLORIDE 0.9 % WEIGHT BASED INFUSION
3.0000 mL/kg/h | INTRAVENOUS | Status: DC
  Administered 2017-09-14: 3 mL/kg/h via INTRAVENOUS

## 2017-09-14 MED ORDER — SIMVASTATIN 20 MG PO TABS
20.0000 mg | ORAL_TABLET | Freq: Every day | ORAL | Status: DC
  Administered 2017-09-14 – 2017-09-15 (×2): 20 mg via ORAL
  Filled 2017-09-14 (×2): qty 1

## 2017-09-14 MED ORDER — MIDAZOLAM HCL 2 MG/2ML IJ SOLN
INTRAMUSCULAR | Status: AC
  Filled 2017-09-14: qty 2

## 2017-09-14 MED ORDER — MIDAZOLAM HCL 2 MG/2ML IJ SOLN
INTRAMUSCULAR | Status: DC | PRN
  Administered 2017-09-14: 1 mg via INTRAVENOUS

## 2017-09-14 MED ORDER — SODIUM CHLORIDE 0.9 % IV SOLN
INTRAVENOUS | Status: DC
  Administered 2017-09-14: 17:00:00 via INTRAVENOUS

## 2017-09-14 MED ORDER — ISOSORBIDE MONONITRATE ER 30 MG PO TB24
30.0000 mg | ORAL_TABLET | Freq: Every day | ORAL | Status: DC
  Administered 2017-09-14 – 2017-09-18 (×5): 30 mg via ORAL
  Filled 2017-09-14 (×5): qty 1

## 2017-09-14 MED ORDER — PANTOPRAZOLE SODIUM 40 MG PO TBEC
40.0000 mg | DELAYED_RELEASE_TABLET | Freq: Every day | ORAL | Status: DC
  Administered 2017-09-14 – 2017-09-18 (×5): 40 mg via ORAL
  Filled 2017-09-14 (×6): qty 1

## 2017-09-14 MED ORDER — METOPROLOL TARTRATE 50 MG PO TABS
50.0000 mg | ORAL_TABLET | Freq: Two times a day (BID) | ORAL | Status: DC
  Administered 2017-09-14 – 2017-09-15 (×2): 50 mg via ORAL
  Filled 2017-09-14 (×2): qty 1

## 2017-09-14 MED ORDER — GLIMEPIRIDE 4 MG PO TABS
4.0000 mg | ORAL_TABLET | Freq: Two times a day (BID) | ORAL | Status: DC
  Administered 2017-09-15 – 2017-09-17 (×5): 4 mg via ORAL
  Filled 2017-09-14 (×5): qty 1

## 2017-09-14 MED ORDER — HEPARIN (PORCINE) IN NACL 2-0.9 UNIT/ML-% IJ SOLN
INTRAMUSCULAR | Status: AC
  Filled 2017-09-14: qty 1000

## 2017-09-14 MED ORDER — SODIUM CHLORIDE 0.9% FLUSH
3.0000 mL | Freq: Two times a day (BID) | INTRAVENOUS | Status: DC
  Administered 2017-09-15 – 2017-09-18 (×6): 3 mL via INTRAVENOUS

## 2017-09-14 MED ORDER — ACETAMINOPHEN 325 MG PO TABS: 650.0000 mg | ORAL_TABLET | ORAL | Status: DC | PRN

## 2017-09-14 MED ORDER — ALLOPURINOL 300 MG PO TABS
300.0000 mg | ORAL_TABLET | Freq: Every day | ORAL | Status: DC
  Administered 2017-09-15 – 2017-09-18 (×4): 300 mg via ORAL
  Filled 2017-09-14 (×4): qty 1

## 2017-09-14 MED ORDER — ASPIRIN EC 81 MG PO TBEC
81.0000 mg | DELAYED_RELEASE_TABLET | Freq: Every day | ORAL | Status: DC
  Administered 2017-09-15 – 2017-09-18 (×4): 81 mg via ORAL
  Filled 2017-09-14 (×4): qty 1

## 2017-09-14 MED ORDER — ONDANSETRON HCL 4 MG/2ML IJ SOLN: 4.0000 mg | Freq: Four times a day (QID) | INTRAMUSCULAR | Status: DC | PRN

## 2017-09-14 MED ORDER — OXYCODONE HCL 5 MG PO TABS: 5.0000 mg | ORAL_TABLET | ORAL | Status: DC | PRN

## 2017-09-14 MED ORDER — IOPAMIDOL (ISOVUE-370) INJECTION 76%
INTRAVENOUS | Status: AC
  Filled 2017-09-14: qty 125

## 2017-09-14 SURGICAL SUPPLY — 13 items
CATH INFINITI 5 FR RCB (CATHETERS) ×2 IMPLANT
CATH INFINITI 5FR MPB2 (CATHETERS) ×2 IMPLANT
CATH INFINITI JR4 5F (CATHETERS) ×2 IMPLANT
CATH LAUNCHER 5F RADR (CATHETERS) ×1 IMPLANT
CATHETER LAUNCHER 5F RADR (CATHETERS) ×2
COVER PRB 48X5XTLSCP FOLD TPE (BAG) ×1 IMPLANT
COVER PROBE 5X48 (BAG) ×1
KIT HEART LEFT (KITS) ×2 IMPLANT
PACK CARDIAC CATHETERIZATION (CUSTOM PROCEDURE TRAY) ×2 IMPLANT
SHEATH PINNACLE 5F 10CM (SHEATH) ×2 IMPLANT
TRANSDUCER W/STOPCOCK (MISCELLANEOUS) ×2 IMPLANT
TUBING CIL FLEX 10 FLL-RA (TUBING) ×2 IMPLANT
WIRE EMERALD 3MM-J .035X150CM (WIRE) ×2 IMPLANT

## 2017-09-14 NOTE — Progress Notes (Signed)
10:00 spoke with jennifer rn cath lab to update on glucose, she will contact back if orders are to follow.

## 2017-09-14 NOTE — Progress Notes (Signed)
Neally Rn cath lab called and per Dr Katrinka Blazing pul NS @100  ml/hr

## 2017-09-14 NOTE — Progress Notes (Addendum)
New Admission Note:   Arrival Method: Cath-lab  Mental Orientation: Alert and oriented x 4  Telemetry:3E box 38, AFib on monitor Assessment: Completed Pain:0/10 Tubes: None Safety Measures: Safety Fall Prevention Plan has been discussed  Admission:  3 [place] Orientation: Patient has been orientated to the room, unit and staff.  Family: wife at bedside  Vascular site level 0  Orders to be reviewed and implemented. Will continue to monitor the patient. Call light has been placed within reach and bed alarm has been activated.   Bettey Mare, RN Phone: [phone]

## 2017-09-14 NOTE — Progress Notes (Signed)
Site area: rt groin fa sheath Site Prior to Removal:  Level 0 Pressure Applied For:  20 minutes Manual:   yes Patient Status During Pull:  stable Post Pull Site:  Level  0 Post Pull Instructions Given:  yes Post Pull Pulses Present: dopplered Dressing Applied:  Gauze and tegaderm Bedrest begins @  1950 Comments:

## 2017-09-14 NOTE — H&P (View-Only) (Signed)
HPI [name] returns today for ongoing evaluation and treatment of coronary artery disease, carotid vascular disease, and hypertension. I saw him approximately 8 months ago and he had worsening anginal symptoms. He underwent stress testing which was abnormal. He was appropriately placed on additional anti-anginal therapy with Imdur. The patient has chronic renal insufficiency. His most recent creatinine was in the 1.5 range. Over the last few weeks, he notes progression of his anginal symptoms. He states that when he walks across the room, he gets bilateral arm and shoulder pain as well as substernal chest pressure. His symptoms resolved with rest. He denies medical noncompliance. He has not had syncope.  Allergies  Allergen Reactions  . Ramipril Anaphylaxis     Current Outpatient Prescriptions  Medication Sig Dispense Refill  . allopurinol (ZYLOPRIM) 300 MG tablet Take 300 mg by mouth daily.      Marland Kitchen. aspirin EC 81 MG tablet Take 81 mg by mouth at bedtime.     . clopidogrel (PLAVIX) 75 MG tablet Take 75 mg by mouth daily.     . colchicine 0.6 MG tablet Take 0.6 mg by mouth daily. COLCRYS    . ferrous sulfate (FERROUSUL) 325 (65 FE) MG tablet Take 325 mg by mouth at bedtime.     Marland Kitchen. glimepiride (AMARYL) 4 MG tablet Take 4 mg by mouth 2 (two) times daily.    . isosorbide mononitrate (IMDUR) 30 MG 24 hr tablet Take 1 tablet (30 mg total) by mouth daily. 90 tablet 3  . losartan (COZAAR) 100 MG tablet Take 100 mg by mouth daily.      . metFORMIN (GLUCOPHAGE) 850 MG tablet Take 850 mg by mouth 2 (two) times daily.     . metoprolol (LOPRESSOR) 50 MG tablet Take 1 tablet (50 mg total) by mouth 2 (two) times daily. 60 tablet 1  . omeprazole (PRILOSEC) 20 MG capsule Take 20 mg by mouth daily.     . simvastatin (ZOCOR) 20 MG tablet Take 20 mg by mouth daily.     . sitaGLIPtin (JANUVIA) 100 MG tablet Take 100 mg by mouth at bedtime.      No current facility-administered medications for this  visit.      Past Medical History:  Diagnosis Date  . Anemia   . Atrial fibrillation (HCC)    Coumadin deferred due to the need for Plavix and aspirin post DES for NSTEMI/PCI 11/12  . Cardiomyopathy, ischemic 09/20/2011  . Carotid artery stenosis without cerebral infarction 06/01/2014  . Carotid stenosis    Carotid Dopplers: RICA without significant stenosis; LICA 60-79%.  . Chronic kidney disease   . Coronary artery disease    Status post CABG in 2000; status post NSTEMI 11/12 -LHC 09/20/11: LM 20%, pLAD 70% and mid occluded, CFX and RCA occluded, oL-LAD 60%, pSVG-RCA 80% and dist 40%, pSVG-OM2 30%, pSVG-OM3 20%.  PCI: Promus DES to [facility]-RCA   . Coronary atherosclerosis of artery bypass graft 10/24/2011  . Diabetes mellitus   . Diverticulosis of colon 09/18/2011   Had Endoscopy 2005 With Elwin SleightKaplan Greer  ENDOSCOPIC IMPRESSION:    1) Diverticula in the mid transverse colon     2) Mild diverticulosis in the sigmoid colon    . DM (diabetes mellitus) type I uncontrolled, periph vascular disorder (HCC) 09/18/2011  . Gout    left foot  . HLD (hyperlipidemia)   . Hyperlipidemia 10/24/2011  . Hypertension   . Ischemic cardiomyopathy    Echocardiogram 09/20/11: Mid to distal  anterior, septal and apical akinesis, mild AI, mild MR, mild LAE, EF 35%.  . MI (myocardial infarction) (HCC)   . Nephrolithiasis 09/18/2011  . Non-STEMI (non-ST elevated myocardial infarction) (HCC) 09/18/2011  . Osteomyelitis (HCC) 09/18/2011  . 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 PVD (peripheral vascular disease) (HCC) 09/29/2011  . SOB (shortness of breath)     ROS:   All systems reviewed and negative except as noted in the HPI.   Past Surgical History:  Procedure Laterality Date  . AMPUTATION  09/26/2011   Procedure: AMPUTATION BELOW KNEE;  Surgeon: Sherren Kerns, MD;  Location: [facility] OR;  Service: Vascular;  Laterality: Left;  . CARDIAC CATHETERIZATION    . CAROTID ANGIOGRAM  Bilateral 06/11/2014   Procedure: CAROTID ANGIOGRAM;  Surgeon: Fransisco Hertz, MD;  Location: [facility] CATH LAB;  Service: Cardiovascular;  Laterality: Bilateral;  . CAROTID ENDARTERECTOMY Right 2000   CEA  . CATARACT EXTRACTION, BILATERAL    . CORONARY ARTERY BYPASS GRAFT  10 years ago  . EYE SURGERY    . KNEE ARTHROPLASTY     2000  . LEFT HEART CATHETERIZATION WITH CORONARY/GRAFT ANGIOGRAM N/A 09/20/2011   Procedure: LEFT HEART CATHETERIZATION WITH Isabel Caprice;  Surgeon: Iran Ouch, MD;  Location: [facility] CATH LAB;  Service: Cardiovascular;  Laterality: N/A;  . LOWER EXTREMITY ANGIOGRAM N/A 09/25/2011   Procedure: LOWER EXTREMITY ANGIOGRAM;  Surgeon: Kathleene Hazel, MD;  Location: [facility] CATH LAB;  Service: Cardiovascular;  Laterality: N/A;  . PERCUTANEOUS CORONARY STENT INTERVENTION (PCI-S) N/A 09/20/2011   Procedure: PERCUTANEOUS CORONARY STENT INTERVENTION (PCI-S);  Surgeon: Iran Ouch, MD;  Location: [facility] CATH LAB;  Service: Cardiovascular;  Laterality: N/A;     Family History  Problem Relation Age of Onset  . Heart disease Father        before age 63     Social History   Social History  . Marital status: Married    Spouse name: N/A  . Number of children: N/A  . Years of education: N/A   Occupational History  . Not on file.   Social History Main Topics  . Smoking status: Never Smoker  . Smokeless tobacco: Never Used  . Alcohol use No  . Drug use: No  . Sexual activity: No   Other Topics Concern  . Not on file   Social History Narrative  . No narrative on file     BP 118/64   Pulse 74   Ht 5\' 10"  (1.778 m)   Wt 182 lb (82.6 kg)   BMI 26.11 kg/m   Physical Exam:  Well appearing  76 year old man, NAD HEENT: Unremarkable Neck:  6 cm  JVD, no thyromegally Lymphatics:  No adenopathy Back:  No CVA tenderness Lungs:  Clear, With no wheezes, rales, or rhonchi. HEART:  Regular rate rhythm, no murmurs, no rubs, no clicks Abd:  soft, positive  bowel sounds, no organomegally, no rebound, no guarding Ext:  2 plus pulses, no edema, no cyanosis, no clubbing Skin:  No rashes no nodules Neuro:  CN II through XII intact, motor grossly intact  EKG - normal sinus rhythm with inferior and lateral ST-T wave changes, worrisome for ischemia. There is also old anteroseptal myocardial infarction.   Assess/Plan: 1. Crescendo angina - the patient has had an increase in his anginal symptoms. His blood pressure and heart rate are well controlled and we have no room to add additional anti-anginal medications at this time.  I have suggested he undergo cardiac catheterization based on his anginal symptoms and prior abnormal stress test. He is on essentially maximal medical therapy. Ranolazine would be a consideration but I would suggest this only if revascularization was not an option. 2. Carotid vascular disease - he will follow-up with Dr. Darrick Penna as scheduled. He currently has no symptoms. 3. Dyslipidemia - he is on simvastatin. We will plan to obtain a fasting lipid panel in the future.  Lewayne Bunting, M.D.

## 2017-09-14 NOTE — Interval H&P Note (Signed)
Cath Lab Visit (complete for each Cath Lab visit)  Clinical Evaluation Leading to the Procedure:   ACS: No.  Non-ACS:    Anginal Classification: CCS III  Anti-ischemic medical therapy: Maximal Therapy (2 or more classes of medications)  Non-Invasive Test Results: No non-invasive testing performed  Prior CABG: Previous CABG      History and Physical Interval Note:  09/14/2017 6:25 PM  Todd Cook  has presented today for surgery, with the diagnosis of cp, sob  The various methods of treatment have been discussed with the patient and family. After consideration of risks, benefits and other options for treatment, the patient has consented to  Procedure(s): LEFT HEART CATH AND CORS/GRAFTS ANGIOGRAPHY (N/A) as a surgical intervention .  The patient's history has been reviewed, patient examined, no change in status, stable for surgery.  I have reviewed the patient's chart and labs.  Questions were answered to the patient's satisfaction.     Lyn Records III

## 2017-09-15 ENCOUNTER — Inpatient Hospital Stay (HOSPITAL_COMMUNITY): Payer: Medicare HMO

## 2017-09-15 DIAGNOSIS — I5022 Chronic systolic (congestive) heart failure: Secondary | ICD-10-CM

## 2017-09-15 DIAGNOSIS — Z951 Presence of aortocoronary bypass graft: Secondary | ICD-10-CM

## 2017-09-15 DIAGNOSIS — I255 Ischemic cardiomyopathy: Secondary | ICD-10-CM

## 2017-09-15 DIAGNOSIS — I10 Essential (primary) hypertension: Secondary | ICD-10-CM

## 2017-09-15 DIAGNOSIS — L8990 Pressure ulcer of unspecified site, unspecified stage: Secondary | ICD-10-CM | POA: Insufficient documentation

## 2017-09-15 DIAGNOSIS — I5043 Acute on chronic combined systolic (congestive) and diastolic (congestive) heart failure: Secondary | ICD-10-CM

## 2017-09-15 DIAGNOSIS — Z9889 Other specified postprocedural states: Secondary | ICD-10-CM

## 2017-09-15 LAB — GLUCOSE, CAPILLARY
GLUCOSE-CAPILLARY: 324 mg/dL — AB (ref 65–99)
GLUCOSE-CAPILLARY: 397 mg/dL — AB (ref 65–99)
GLUCOSE-CAPILLARY: 373 mg/dL — AB (ref 65–99)
Glucose-Capillary: 186 mg/dL — ABNORMAL HIGH (ref 65–99)

## 2017-09-15 LAB — BASIC METABOLIC PANEL
Anion gap: 12 (ref 5–15)
BUN: 38 mg/dL — AB (ref 6–20)
CO2: 17 mmol/L — ABNORMAL LOW (ref 22–32)
CREATININE: 2.03 mg/dL — AB (ref 0.61–1.24)
Calcium: 8.5 mg/dL — ABNORMAL LOW (ref 8.9–10.3)
Chloride: 105 mmol/L (ref 101–111)
GFR calc Af Amer: 34 mL/min — ABNORMAL LOW (ref >60)
GFR, EST NON AFRICAN AMERICAN: 30 mL/min — AB (ref >60)
GLUCOSE: 330 mg/dL — AB (ref 65–99)
Potassium: 5.7 mmol/L — ABNORMAL HIGH (ref 3.5–5.1)
SODIUM: 134 mmol/L — AB (ref 135–145)

## 2017-09-15 MED ORDER — FUROSEMIDE 10 MG/ML IJ SOLN
80.0000 mg | Freq: Two times a day (BID) | INTRAMUSCULAR | Status: DC
  Administered 2017-09-15 – 2017-09-16 (×2): 80 mg via INTRAVENOUS
  Filled 2017-09-15 (×2): qty 8

## 2017-09-15 MED ORDER — ATORVASTATIN CALCIUM 80 MG PO TABS
80.0000 mg | ORAL_TABLET | Freq: Every day | ORAL | Status: DC
  Administered 2017-09-15 – 2017-09-18 (×4): 80 mg via ORAL
  Filled 2017-09-15 (×4): qty 1

## 2017-09-15 MED ORDER — INSULIN ASPART 100 UNIT/ML ~~LOC~~ SOLN
0.0000 [IU] | Freq: Three times a day (TID) | SUBCUTANEOUS | Status: DC
  Administered 2017-09-15 (×2): 15 [IU] via SUBCUTANEOUS
  Administered 2017-09-16 – 2017-09-17 (×3): 5 [IU] via SUBCUTANEOUS
  Administered 2017-09-17: 11 [IU] via SUBCUTANEOUS
  Administered 2017-09-17: 3 [IU] via SUBCUTANEOUS
  Administered 2017-09-18 (×3): 5 [IU] via SUBCUTANEOUS

## 2017-09-15 MED ORDER — INSULIN ASPART 100 UNIT/ML ~~LOC~~ SOLN: 0.0000 [IU] | Freq: Every day | SUBCUTANEOUS | Status: DC

## 2017-09-15 NOTE — Progress Notes (Signed)
Patient with no complains or concerns during the night. Denies pain 0/10. Vital signs stable. Femoral site without any complication, Level 0. Wife at beside all night.  Will continue to monitor.  Kiyon Fidalgo, RN

## 2017-09-15 NOTE — Progress Notes (Signed)
Progress Note  Patient Name: Todd Cook Date of Encounter: 09/15/2017  Primary Cardiologist: Dr Ladona Ridgel  Subjective   No chest pain or SOB at rest, gets SOB when walking to the bathroom.  Inpatient Medications    Scheduled Meds: . allopurinol  300 mg Oral Daily  . aspirin EC  81 mg Oral Daily  . clopidogrel  75 mg Oral Daily  . colchicine  0.6 mg Oral Daily  . ferrous sulfate  325 mg Oral QHS  . glimepiride  4 mg Oral BID  . heparin  5,000 Units Subcutaneous Q8H  . isosorbide mononitrate  30 mg Oral Daily  . linagliptin  5 mg Oral Daily  . metoprolol tartrate  50 mg Oral BID  . pantoprazole  40 mg Oral Daily  . simvastatin  20 mg Oral Daily  . sodium chloride flush  3 mL Intravenous Q12H   Continuous Infusions: . sodium chloride     PRN Meds: sodium chloride, acetaminophen, ondansetron (ZOFRAN) IV, oxyCODONE, sodium chloride flush   Vital Signs    Vitals:   09/14/17 2355 09/15/17 0100 09/15/17 0450 09/15/17 0835  BP: 125/60 (!) 132/47 (!) 128/54   Pulse: 60 (!) 57 65   Resp: 20 20 18    Temp:  98.3 F (36.8 C) 98 F (36.7 C)   TempSrc:  Oral Oral   SpO2: 100% 99% 98%   Weight:   187 lb 9.8 oz (85.1 kg) 180 lb 11.2 oz (82 kg)  Height:        Intake/Output Summary (Last 24 hours) at 09/15/17 1202 Last data filed at 09/15/17 0900  Gross per 24 hour  Intake           985.09 ml  Output              550 ml  Net           435.09 ml   Filed Weights   09/14/17 2020 09/15/17 0450 09/15/17 0835  Weight: 187 lb 13.3 oz (85.2 kg) 187 lb 9.8 oz (85.1 kg) 180 lb 11.2 oz (82 kg)    Telemetry    A-fib-rate controlled - Personally Reviewed  Physical Exam  JVDs up to the jaws B/L GEN: No acute distress.   Neck: No JVD Cardiac: iRRR, 2/6 systolic murmur, rubs, or gallops.  Respiratory: decreased BS at the bases. GI: Soft, nontender, non-distended  MS: Mild edema on the right, s/p BKA on the left; No deformity. Neuro:  Nonfocal  Psych: Normal affect    Labs    Chemistry Recent Labs Lab 09/14/17 0938 09/14/17 2112 09/15/17 0454  NA 134*  --  134*  K 5.0  --  5.7*  CL 106  --  105  CO2 20*  --  17*  GLUCOSE 308*  --  330*  BUN 40*  --  38*  CREATININE 2.09* 1.95* 2.03*  CALCIUM 8.8*  --  8.5*  GFRNONAA 29* 31* 30*  GFRAA 33* 36* 34*  ANIONGAP 8  --  12     Hematology Recent Labs Lab 09/14/17 2112  WBC 9.2  RBC 3.84*  HGB 11.7*  HCT 35.8*  MCV 93.2  MCH 30.5  MCHC 32.7  RDW 15.5  PLT 165    Cardiac EnzymesNo results for input(s): TROPONINI in the last 168 hours. No results for input(s): TROPIPOC in the last 168 hours.   BNPNo results for input(s): BNP, PROBNP in the last 168 hours.   DDimer No results for input(s):  DDIMER in the last 168 hours.   Radiology    No results found.  Cardiac Studies   Heart catheterization: 09/14/2017   Ischemic cardiomyopathy with acute on chronic systolic heart failure.  LVEF approximately 15%.  LVEDP 26 mmHg.  Patent but diseased left main  Diffusely diseased proximal to mid LAD, mid 99% LAD, and mid to distal LAD is totally occluded.  The large first diagonal contains 99% stenosis.  The vessel is diffusely diseased and was previously grafted.  Circumflex is totally occluded proximally.  Ostial right coronary is totally occluded.   LIMA to LAD is previously known to be totally occluded.  SVG to the right coronary is totally occluded within the previously placed stent (2012).  SVG to the proximal obtuse marginal contains severe diffuse 85-95% stenosis before ending on a small diffusely diseased native branch.  The native branch contains diffuse 80-90% narrowing.  SVG to the distal obtuse marginal contains proximal to mid diffuse disease up to 50% but is otherwise widely patent.  RECOMMENDATIONS:   At risk for acute kidney failure due to contrast.  90 cc of contrast was used during this procedure.  Needs advanced heart failure team consult.  Needs optimization  of heart failure therapy.  The patient has no revascularization options.  Overall prognosis is poor.    Patient Profile     76 y.o. male   Assessment & Plan    1. CAD, with worsening angina, cath yesterday showed patent but diseased left main, mid 99% LAD, and mid to distal LAD is totally occluded. LIMA to LAD is previously known to be totally occluded. The large first diagonal contains 99% stenosis.  Circumflex is totally occluded proximally. Ostial right coronary is totally occluded. SVG to the right coronary is totally occluded within the previously placed stent (2012). SVG to the proximal obtuse marginal contains severe diffuse 85-95% stenosis before ending on a small diffusely diseased native branch.  The native branch contains diffuse 80-90% narrowing. SVG to the distal obtuse marginal contains proximal to mid diffuse disease up to 50% but is otherwise widely patent. No targets for intervention,   - I will continue ASA, switch simvastatin 20 mg daily to atorvastatin 80 mg daily, hold metoprolol in the settings of acute CHF.  2. 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  3. Acute on chronic kidney failure - Creatinine at baseline 1.5, currently 2.0 we'll monitor closely.  4. Poor diabetes control - Start sliding scale of insulin - Diabetes consult.  For questions or updates, please contact [facility] Please consult [email or web address] for contact info under Cardiology/STEMI.      Signed, Tobias AlexanderKatarina Nafisa Olds, MD  09/15/2017, 12:02 PM

## 2017-09-15 NOTE — Progress Notes (Signed)
Results for AYDIN, BANO (MRN [id number]) as of 09/15/2017 16:41  Ref. Range 09/14/2017 13:49 09/14/2017 21:02 09/15/2017 08:02 09/15/2017 11:28 09/15/2017 16:19  Glucose-Capillary Latest Ref Range: 65 - 99 mg/dL 500 (H) 370 (H) 488 (H) 397 (H) 373 (H)  Received diabetes coordinator consult in regards to patient's diabetes. Noted that CBGs have been greater than 180 mg/dl.  Recommend adding Levemir or Lantus 12-15 units daily along with Novolog MODERATE correction scale TID & HS if blood sugars continue to be elevated. Recommend checking HgbA1C for home glucose control.   Will continue to follow patient's blood sugars while in the hospital.   Smith Mince RN BSN CDE Diabetes Coordinator Pager: [phone]  8am-5pm

## 2017-09-16 ENCOUNTER — Other Ambulatory Visit: Payer: Self-pay

## 2017-09-16 ENCOUNTER — Inpatient Hospital Stay (HOSPITAL_COMMUNITY): Payer: Medicare HMO

## 2017-09-16 DIAGNOSIS — I200 Unstable angina: Secondary | ICD-10-CM

## 2017-09-16 DIAGNOSIS — I351 Nonrheumatic aortic (valve) insufficiency: Secondary | ICD-10-CM

## 2017-09-16 LAB — BASIC METABOLIC PANEL
Anion gap: 11 (ref 5–15)
BUN: 40 mg/dL — AB (ref 6–20)
CALCIUM: 8.8 mg/dL — AB (ref 8.9–10.3)
CO2: 20 mmol/L — ABNORMAL LOW (ref 22–32)
CREATININE: 2.13 mg/dL — AB (ref 0.61–1.24)
Chloride: 106 mmol/L (ref 101–111)
GFR calc Af Amer: 32 mL/min — ABNORMAL LOW (ref >60)
GFR, EST NON AFRICAN AMERICAN: 28 mL/min — AB (ref >60)
GLUCOSE: 94 mg/dL (ref 65–99)
POTASSIUM: 4 mmol/L (ref 3.5–5.1)
SODIUM: 137 mmol/L (ref 135–145)

## 2017-09-16 LAB — ECHOCARDIOGRAM COMPLETE
HEIGHTINCHES: 70 in
WEIGHTICAEL: 2843.05 [oz_av]

## 2017-09-16 LAB — GLUCOSE, CAPILLARY
GLUCOSE-CAPILLARY: 93 mg/dL (ref 65–99)
GLUCOSE-CAPILLARY: 239 mg/dL — AB (ref 65–99)
GLUCOSE-CAPILLARY: 228 mg/dL — AB (ref 65–99)
GLUCOSE-CAPILLARY: 190 mg/dL — AB (ref 65–99)

## 2017-09-16 MED ORDER — RANOLAZINE ER 500 MG PO TB12
500.0000 mg | ORAL_TABLET | Freq: Two times a day (BID) | ORAL | Status: DC
  Administered 2017-09-16 – 2017-09-18 (×5): 500 mg via ORAL
  Filled 2017-09-16 (×5): qty 1

## 2017-09-16 MED ORDER — PERFLUTREN LIPID MICROSPHERE
1.0000 mL | INTRAVENOUS | Status: AC | PRN
  Administered 2017-09-16: 2 mL via INTRAVENOUS
  Filled 2017-09-16: qty 10

## 2017-09-16 MED ORDER — FUROSEMIDE 80 MG PO TABS
80.0000 mg | ORAL_TABLET | Freq: Every day | ORAL | Status: DC
  Administered 2017-09-17 – 2017-09-18 (×2): 80 mg via ORAL
  Filled 2017-09-16 (×2): qty 1

## 2017-09-16 MED ORDER — SACUBITRIL-VALSARTAN 24-26 MG PO TABS
1.0000 | ORAL_TABLET | Freq: Two times a day (BID) | ORAL | Status: DC
  Administered 2017-09-17 – 2017-09-18 (×3): 1 via ORAL
  Filled 2017-09-16 (×4): qty 1

## 2017-09-16 NOTE — Progress Notes (Signed)
  Echocardiogram 2D Echocardiogram has been performed.  Roosvelt Maser F 09/16/2017, 12:33 PM

## 2017-09-16 NOTE — Progress Notes (Signed)
Patient had an increase in multifocal PVC's. Patient is asymptomatic and sleeping.  Elsie Lincoln, RN

## 2017-09-16 NOTE — Consult Note (Signed)
Advanced Heart Failure Team Consult Note   Referring Physician: Dr. Diana EvesNelsos Primary Cardiologist:  Dr. Ladona Ridgelaylor  Reason for Consultation: CHF  HPI:    Todd Cook is seen today for evaluation of CHF at the request of Dr. Kathrene AluNelso.   [name] is a 76 y/o male with DM2, PAD s/p L BKA, CKD stage IV CAD s/p CABG 2000 who is folowed by Dr. Ladona Ridgelaylor  Previous echo 2012 showed EF 35% but f/u echo 1/13 reported EF 60-65% (images unavailable). Admitted on 11/2 for cath due to progressive angina. Cath 09/14/17 showed severe diabetic native vessel and graft disease with EF 15%. LVEDP 26. (no RHC performed). There were no targets for revascularization.  The HF team was consulted to help with further management. Echo is pending.   In talking with him and his family he has had worsening functional status over past 6 months to 1 year. However about 3 weeks ago had a marked increase in his angina to the point where he now gets angina with almost any movement. His PCP (Dr. Elmore GuiseEd Green) started some NTG which has helped but he remains quite limited. Denies orthopnea, PND or LE edema.   Post-cath we started IV lasix and weight down 3 pounds. He feels his breathing is better and is a bit brighter.    Review of Systems: [y] = yes, [ ]  = no   General: Weight gain [ ] ; Weight loss [ ] ; Anorexia [ ] ; Fatigue Cove.Etienne[y ]; Fever [ ] ; Chills [ ] ; Weakness Cove.Etienne[y ]  Cardiac: Chest pain/pressure [ y]; Resting SOB [ ] ; Exertional SOB Cove.Etienne[y ]; Orthopnea [ ] ; Pedal Edema [ ] ; Palpitations [ ] ; Syncope [ ] ; Presyncope [ ] ; Paroxysmal nocturnal dyspnea[ ]   Pulmonary: Cough [ ] ; Wheezing[ ] ; Hemoptysis[ ] ; Sputum [ ] ; Snoring [ ]   GI: Vomiting[ ] ; Dysphagia[ ] ; Melena[ ] ; Hematochezia [ ] ; Heartburn[ ] ; Abdominal pain [ ] ; Constipation [ ] ; Diarrhea [ ] ; BRBPR [ ]   GU: Hematuria[ ] ; Dysuria [ ] ; Nocturia[ ]   Vascular: Pain in legs with walking [ ] ; Pain in feet with lying flat [ ] ; Non-healing sores [ ] ; Stroke [ ] ; TIA [ ] ;  Slurred speech [ ] ;  Neuro: Headaches[ ] ; Vertigo[ ] ; Seizures[ ] ; Paresthesias[ ] ;Blurred vision [ ] ; Diplopia [ ] ; Vision changes [ ]   Ortho/Skin: Arthritis [ y]; Joint pain Cove.Etienne[y ]; Muscle pain [ ] ; Joint swelling [ ] ; Back Pain [ ] ; Rash [ ]   Psych: Depression[ ] ; Anxiety[ ]   Heme: Bleeding problems [ y]; Clotting disorders [ ] ; Anemia [ y]  Endocrine: Diabetes Cove.Etienne[y ]; Thyroid dysfunction[ ]   Home Medications Prior to Admission medications   Medication Sig Start Date End Date Taking? Authorizing Provider  allopurinol (ZYLOPRIM) 300 MG tablet Take 300 mg by mouth daily.     Yes [provider]  aspirin EC 81 MG tablet Take 81 mg by mouth at bedtime.    Yes [provider]  clopidogrel (PLAVIX) 75 MG tablet Take 75 mg by mouth daily.  05/22/14  Yes [provider]  colchicine 0.6 MG tablet Take 0.6 mg by mouth daily. COLCRYS   Yes [provider]  ferrous sulfate (FERROUSUL) 325 (65 FE) MG tablet Take 325 mg by mouth at bedtime.    Yes [provider]  glimepiride (AMARYL) 4 MG tablet Take 4 mg by mouth 2 (two) times daily.   Yes [provider]  isosorbide mononitrate (IMDUR) 30 MG 24 hr  tablet Take 1 tablet (30 mg total) by mouth daily. Patient taking differently: Take 30 mg by mouth 2 (two) times daily.  01/08/17 09/07/17 Yes Duke Salvia, MD  losartan (COZAAR) 100 MG tablet Take 100 mg by mouth daily.     Yes [provider]  metFORMIN (GLUCOPHAGE) 850 MG tablet Take 850 mg by mouth daily.  08/16/12  Yes [provider]  metoprolol (LOPRESSOR) 50 MG tablet Take 1 tablet (50 mg total) by mouth 2 (two) times daily. 10/04/11 09/07/17 Yes Angiulli, Mcarthur Rossetti, PA-C  omeprazole (PRILOSEC) 20 MG capsule Take 20 mg by mouth 2 (two) times daily before a meal.  05/22/14  Yes [provider]  simvastatin (ZOCOR) 20 MG tablet Take 20 mg by mouth daily.    Yes [provider]  sitaGLIPtin (JANUVIA) 100 MG tablet Take  100 mg by mouth at bedtime.    Yes [provider]   . allopurinol  300 mg Oral Daily  . aspirin EC  81 mg Oral Daily  . atorvastatin  80 mg Oral q1800  . clopidogrel  75 mg Oral Daily  . colchicine  0.6 mg Oral Daily  . ferrous sulfate  325 mg Oral QHS  . furosemide  80 mg Intravenous BID  . glimepiride  4 mg Oral BID  . heparin  5,000 Units Subcutaneous Q8H  . insulin aspart  0-15 Units Subcutaneous TID WC  . insulin aspart  0-5 Units Subcutaneous QHS  . isosorbide mononitrate  30 mg Oral Daily  . linagliptin  5 mg Oral Daily  . pantoprazole  40 mg Oral Daily  . sodium chloride flush  3 mL Intravenous Q12H    Past Medical History: Past Medical History:  Diagnosis Date  . Anemia   . Atrial fibrillation (HCC)    Coumadin deferred due to the need for Plavix and aspirin post DES for NSTEMI/PCI 11/12  . Cardiomyopathy, ischemic 09/20/2011  . Carotid artery stenosis without cerebral infarction 06/01/2014  . Carotid stenosis    Carotid Dopplers: RICA without significant stenosis; LICA 60-79%.  . Chronic kidney disease   . Coronary artery disease    Status post CABG in 2000; status post NSTEMI 11/12 -LHC 09/20/11: LM 20%, pLAD 70% and mid occluded, CFX and RCA occluded, oL-LAD 60%, pSVG-RCA 80% and dist 40%, pSVG-OM2 30%, pSVG-OM3 20%.  PCI: Promus DES to [facility]   . Coronary atherosclerosis of artery bypass graft 10/24/2011  . Diabetes mellitus   . Diverticulosis of colon 09/18/2011   Had Endoscopy 2005 With Elwin Sleight  ENDOSCOPIC IMPRESSION:    1) Diverticula in the mid transverse colon     2) Mild diverticulosis in the sigmoid colon    . DM (diabetes mellitus) type I uncontrolled, periph vascular disorder (HCC) 09/18/2011  . Gout    left foot  . HLD (hyperlipidemia)   . Hyperlipidemia 10/24/2011  . Hypertension   . Ischemic cardiomyopathy    Echocardiogram 09/20/11: Mid to distal anterior, septal and apical akinesis, mild AI, mild MR, mild LAE, EF 35%.  . MI (myocardial  infarction) (HCC)   . Nephrolithiasis 09/18/2011  . Non-STEMI (non-ST elevated myocardial infarction) (HCC) 09/18/2011  . Osteomyelitis (HCC) 09/18/2011  . 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 PVD (peripheral vascular disease) (HCC) 09/29/2011  . SOB (shortness of breath)     Past Surgical History: Past Surgical History:  Procedure Laterality Date  . CARDIAC CATHETERIZATION    . CAROTID  ENDARTERECTOMY Right 2000   CEA  . CATARACT EXTRACTION, BILATERAL    . CORONARY ARTERY BYPASS GRAFT  10 years ago  . EYE SURGERY    . KNEE ARTHROPLASTY     2000    Family History: Family History  Problem Relation Age of Onset  . Heart disease Father        before age 17    Social History: Social History   Socioeconomic History  . Marital status: Married    Spouse name: None  . Number of children: None  . Years of education: None  . Highest education level: None  Social Needs  . Financial resource strain: None  . Food insecurity - worry: None  . Food insecurity - inability: None  . Transportation needs - medical: None  . Transportation needs - non-medical: None  Occupational History  . None  Tobacco Use  . Smoking status: Never Smoker  . Smokeless tobacco: Never Used  Substance and Sexual Activity  . Alcohol use: No    Alcohol/week: 0.0 oz  . Drug use: No  . Sexual activity: No    Birth control/protection: None  Other Topics Concern  . None  Social History Narrative  . None    Allergies:  Allergies  Allergen Reactions  . Ramipril Anaphylaxis    Objective:    Vital Signs:   Temp:  [97.8 F (36.6 C)-98.2 F (36.8 C)] 98.2 F (36.8 C) (11/04 0630) Pulse Rate:  [71-90] 90 (11/04 0630) Resp:  [18] 18 (11/04 0630) BP: (106-151)/(62-77) 106/62 (11/04 0630) SpO2:  [95 %-98 %] 95 % (11/04 0630) Weight:  [80.6 kg (177 lb 11.1 oz)] 80.6 kg (177 lb 11.1 oz) (11/04 0500) Last BM Date: 09/15/17  Weight change: Filed Weights   09/15/17  0450 09/15/17 0835 09/16/17 0500  Weight: 85.1 kg (187 lb 9.8 oz) 82 kg (180 lb 11.2 oz) 80.6 kg (177 lb 11.1 oz)    Intake/Output:   Intake/Output Summary (Last 24 hours) at 09/16/2017 1035 Last data filed at 09/16/2017 0900 Gross per 24 hour  Intake 603 ml  Output 1850 ml  Net -1247 ml      Physical Exam    General:  Elderly male lying in bed. NAD. Slow to answer questions HEENT: normal Neck: supple. JVP 7. Carotids 2+ bilat; no bruits. + R CEA scar No lymphadenopathy or thyromegaly appreciated. Cor: PMI nondisplaced. Regular rate & rhythm. With some ectopy No rubs, gallops or murmurs. Lungs: clear Abdomen: soft, nontender, nondistended. No hepatosplenomegaly. No bruits or masses. Good bowel sounds. Extremities: s/p L BKA. + muscle atrophy bilaterally no cyanosis, clubbing, rash, edema Neuro: alert & orientedx3, cranial nerves grossly intact. moves all 4 extremities w/o difficulty. Affect pleasant   Telemetry   Initial tele seemed to be AF but now appears to be sinus with frequent PVCs. Personally reviewed    EKG    NSR 74 1AVB 224 ms IVCD. Diffuse ST-T abnormalities Personally reviewed   Labs   Basic Metabolic Panel: Recent Labs  Lab 09/14/17 0938 09/14/17 2112 09/15/17 0454 09/16/17 0333  NA 134*  --  134* 137  K 5.0  --  5.7* 4.0  CL 106  --  105 106  CO2 20*  --  17* 20*  GLUCOSE 308*  --  330* 94  BUN 40*  --  38* 40*  CREATININE 2.09* 1.95* 2.03* 2.13*  CALCIUM 8.8*  --  8.5* 8.8*    Liver Function Tests: No results for input(s): AST,  ALT, ALKPHOS, BILITOT, PROT, ALBUMIN in the last 168 hours. No results for input(s): LIPASE, AMYLASE in the last 168 hours. No results for input(s): AMMONIA in the last 168 hours.  CBC: Recent Labs  Lab 09/14/17 2112  WBC 9.2  HGB 11.7*  HCT 35.8*  MCV 93.2  PLT 165    Cardiac Enzymes: No results for input(s): CKTOTAL, CKMB, CKMBINDEX, TROPONINI in the last 168 hours.  BNP: BNP (last 3 results) No  results for input(s): BNP in the last 8760 hours.  ProBNP (last 3 results) No results for input(s): PROBNP in the last 8760 hours.   CBG: Recent Labs  Lab 09/15/17 0802 09/15/17 1128 09/15/17 1619 09/15/17 2121 09/16/17 0715  GLUCAP 324* 397* 373* 186* 93    Coagulation Studies: No results for input(s): LABPROT, INR in the last 72 hours.   Imaging   Dg Chest 2 View  Result Date: 09/15/2017 CLINICAL DATA:  Congestive heart failure. EXAM: CHEST  2 VIEW COMPARISON:  Chest radiograph 09/20/2011 FINDINGS: Monitoring leads overlie the patient. Stable enlarged cardiac and mediastinal contours status post median sternotomy. Perihilar interstitial opacities and lower lung heterogeneous opacities. Small bilateral pleural effusions. Thoracic spine degenerative changes. IMPRESSION: Cardiomegaly, perihilar interstitial opacities and small effusions, suggestive of congestive heart failure/ pulmonary edema. Electronically Signed   By: Annia Belt M.D.   On: 09/15/2017 15:32    Diagnostic Diagram         Medications:     Current Medications: . allopurinol  300 mg Oral Daily  . aspirin EC  81 mg Oral Daily  . atorvastatin  80 mg Oral q1800  . clopidogrel  75 mg Oral Daily  . colchicine  0.6 mg Oral Daily  . ferrous sulfate  325 mg Oral QHS  . furosemide  80 mg Intravenous BID  . glimepiride  4 mg Oral BID  . heparin  5,000 Units Subcutaneous Q8H  . insulin aspart  0-15 Units Subcutaneous TID WC  . insulin aspart  0-5 Units Subcutaneous QHS  . isosorbide mononitrate  30 mg Oral Daily  . linagliptin  5 mg Oral Daily  . pantoprazole  40 mg Oral Daily  . sodium chloride flush  3 mL Intravenous Q12H     Infusions: . sodium chloride         Patient Profile   76 y/o male with DM2, PAD s/p L BKA, CKD stage IV CAD s/p CABG 2000 and systolic HF with EF 15% on cath 09/14/17 who was admitted with crescendo angina and was found to have severe native vessel and graft disease with EF  15% on this admission   Assessment/Plan   1. Acute on chronic systolic HF - EF on cath 11/2 EF 15-20%. ECHO pending - EF 60-65% by report on echo 1/13. Echo 11/12 EF 35% - LVEDP 26 on cath  2. CAD s/p CABG 2000 with severe chronic angina - cath 11/2 with severe native vessel and graft disease without revascularization options  3. PAD & carotid artery stenosis - s/p L BKA - followed by Dr. Darrick Penna  4. DM2  -followed by Dr. Chilton Si  -avoid sulfonylureas if possible   5. CKD, stage IV - baseline creatinine 1.8-2.1  6. PAF - has h/o PAF but nor on blood thinners due to bleeding.  - ECG from 09/05/17 showed NSR but tele here shows some probable AF. Repeat ECG pending   I have reviewed cath images personally with patient and his family. He is currently very limited with  Class IV angina and severe LV dysfunction. Currently it seems like his angina is major limiting factor but I suspect HF and severe deconditioning also playing a role. Unfortunately there are no good targets for revascularization so we will have to do our best in trying to optimize his medical therapy. Volume status now well controlled after IV diuresis.   Will do the following: 1) add Ranexa  2) switch to po diuretics 3) switch losartan to Entresto watching BP and renal function closely 4) switch metoprolol tartrate to carvedilol  5) Await echo.  6) Get ECG to re-evaluate rhythm.  7) Consult PT to see if he is candidate for CIR or SNF.   Total time spent 60 minutes. Over half that time spent discussing above.    Length of Stay: 2  Arvilla Meres, MD  09/16/2017, 10:35 AM  Advanced Heart Failure Team Pager [phone] (M-F; 7a - 4p)  Please contact [facility] for night-coverage after hours (4p -7a ) and weekends on [email or web address]

## 2017-09-17 ENCOUNTER — Encounter (HOSPITAL_COMMUNITY): Payer: Self-pay | Admitting: Interventional Cardiology

## 2017-09-17 DIAGNOSIS — N179 Acute kidney failure, unspecified: Secondary | ICD-10-CM

## 2017-09-17 LAB — BASIC METABOLIC PANEL
ANION GAP: 10 (ref 5–15)
BUN: 43 mg/dL — ABNORMAL HIGH (ref 6–20)
CHLORIDE: 104 mmol/L (ref 101–111)
CO2: 20 mmol/L — AB (ref 22–32)
Calcium: 8.7 mg/dL — ABNORMAL LOW (ref 8.9–10.3)
Creatinine, Ser: 2.34 mg/dL — ABNORMAL HIGH (ref 0.61–1.24)
GFR calc non Af Amer: 25 mL/min — ABNORMAL LOW (ref >60)
GFR, EST AFRICAN AMERICAN: 29 mL/min — AB (ref >60)
Glucose, Bld: 324 mg/dL — ABNORMAL HIGH (ref 65–99)
Potassium: 4.5 mmol/L (ref 3.5–5.1)
Sodium: 134 mmol/L — ABNORMAL LOW (ref 135–145)

## 2017-09-17 LAB — GLUCOSE, CAPILLARY
GLUCOSE-CAPILLARY: 313 mg/dL — AB (ref 65–99)
GLUCOSE-CAPILLARY: 189 mg/dL — AB (ref 65–99)
Glucose-Capillary: 232 mg/dL — ABNORMAL HIGH (ref 65–99)
Glucose-Capillary: 181 mg/dL — ABNORMAL HIGH (ref 65–99)

## 2017-09-17 MED ORDER — INSULIN GLARGINE 100 UNIT/ML ~~LOC~~ SOLN
10.0000 [IU] | Freq: Every day | SUBCUTANEOUS | Status: DC
  Administered 2017-09-17: 10 [IU] via SUBCUTANEOUS
  Filled 2017-09-17 (×2): qty 0.1

## 2017-09-17 MED ORDER — CARVEDILOL 3.125 MG PO TABS
3.1250 mg | ORAL_TABLET | Freq: Two times a day (BID) | ORAL | Status: DC
  Administered 2017-09-17 – 2017-09-18 (×3): 3.125 mg via ORAL
  Filled 2017-09-17 (×3): qty 1

## 2017-09-17 MED ORDER — GLIMEPIRIDE 4 MG PO TABS
4.0000 mg | ORAL_TABLET | Freq: Two times a day (BID) | ORAL | Status: DC
  Administered 2017-09-17 – 2017-09-18 (×3): 4 mg via ORAL
  Filled 2017-09-17 (×3): qty 1

## 2017-09-17 NOTE — Evaluation (Signed)
Physical Therapy Evaluation Patient Details Name: Todd Cook H Futch MRN: [id number] DOB: [birth year] Today's Date: 09/17/2017   History of Present Illness  76 yo male with onset of  SOB and chest pain and received L heart dath and cors/grafts angiography on 11/2, now has CHF with diuresing taking place. Cardiology reports poor prognosis. PMHx:  CHF, CAD with CABG 2000, angina chronically, DM, CKD 5, PAF, L BK with prosthesis and previously ambulating with short trips  Clinical Impression  Pt was seen for evaluation of his mobility and noted the LLE prosthesis had some straps on the silicone sleeve that pt reports were made by wife to manage pain from edge of tibia on prothesis.  He has not spoken of going to prosthetist to check this idea, and not sure if he is creating an issue with the L stump.  However, he is not showing signs of redness or skin breakdown on the stump after walking.  Will focus on limited mobility given pt is SOB with all effort and obviously weak, in need of rehab to return home.  Follow acutely as well for strength and endurance.    Follow Up Recommendations SNF    Equipment Recommendations  None recommended by PT    Recommendations for Other Services       Precautions / Restrictions Precautions Precautions: Fall Required Braces or Orthoses: Other Brace/Splint(L BK prosthesis at hospital) Restrictions Weight Bearing Restrictions: No      Mobility  Bed Mobility Overal bed mobility: Needs Assistance Bed Mobility: Sit to Supine;Supine to Sit     Supine to sit: Min assist Sit to supine: Min assist   General bed mobility comments: minimal help to lift trunk up but used bed pad to assist scooting  Transfers Overall transfer level: Needs assistance Equipment used: Rolling walker (2 wheeled);1 person hand held assist Transfers: Sit to/from Stand Sit to Stand: Mod assist         General transfer comment: heavy mod with pt using UE's to  assist  Ambulation/Gait Ambulation/Gait assistance: Min assist Ambulation Distance (Feet): 8 Feet(4 x 2) Assistive device: Rolling walker (2 wheeled);1 person hand held assist   Gait velocity: reduced Gait velocity interpretation: Below normal speed for age/gender General Gait Details: ptis taking short steps with buckled knees to walk although he is not grossly unsteady, just generally weak  Stairs            Wheelchair Mobility    Modified Rankin (Stroke Patients Only)       Balance Overall balance assessment: Needs assistance Sitting-balance support: Feet supported;Bilateral upper extremity supported Sitting balance-Leahy Scale: Fair     Standing balance support: Bilateral upper extremity supported;During functional activity Standing balance-Leahy Scale: Poor                               Pertinent Vitals/Pain Pain Assessment: No/denies pain    Home Living Family/patient expects to be discharged to:: Private residence Living Arrangements: Spouse/significant other Available Help at Discharge: Family;Available 24 hours/day Type of Home: House       Home Layout: One level Home Equipment: Walker - 2 wheels;Cane - quad;Bedside commode;Shower seat      Prior Function Level of Independence: Needs assistance   Gait / Transfers Assistance Needed: very short walks with prosthesis and RW  ADL's / Homemaking Assistance Needed: wife helps with all his care and takes care of house        Hand Dominance  Dominant Hand: Right    Extremity/Trunk Assessment   Upper Extremity Assessment Upper Extremity Assessment: Generalized weakness    Lower Extremity Assessment Lower Extremity Assessment: Generalized weakness;LLE deficits/detail LLE Deficits / Details: L BK stump with dog earing edema LLE Coordination: decreased gross motor    Cervical / Trunk Assessment Cervical / Trunk Assessment: Kyphotic  Communication   Communication: HOH  Cognition  Arousal/Alertness: Awake/alert Behavior During Therapy: WFL for tasks assessed/performed Overall Cognitive Status: Within Functional Limits for tasks assessed                                        General Comments      Exercises     Assessment/Plan    PT Assessment Patient needs continued PT services  PT Problem List Decreased strength;Decreased range of motion;Decreased activity tolerance;Decreased balance;Decreased mobility;Decreased coordination;Decreased knowledge of use of DME;Decreased safety awareness;Cardiopulmonary status limiting activity       PT Treatment Interventions DME instruction;Gait training;Therapeutic activities;Functional mobility training;Therapeutic exercise;Balance training;Neuromuscular re-education;Patient/family education    PT Goals (Current goals can be found in the Care Plan section)  Acute Rehab PT Goals Patient Stated Goal: to walk again and get home PT Goal Formulation: With patient Time For Goal Achievement: 10/01/17 Potential to Achieve Goals: Good    Frequency Min 2X/week   Barriers to discharge Decreased caregiver support(wife is home alone, weak to transfer)      Co-evaluation               AM-PAC PT "6 Clicks" Daily Activity  Outcome Measure Difficulty turning over in bed (including adjusting bedclothes, sheets and blankets)?: A Little Difficulty moving from lying on back to sitting on the side of the bed? : Unable Difficulty sitting down on and standing up from a chair with arms (e.g., wheelchair, bedside commode, etc,.)?: Unable Help needed moving to and from a bed to chair (including a wheelchair)?: A Little Help needed walking in hospital room?: A Little Help needed climbing 3-5 steps with a railing? : Total 6 Click Score: 12    End of Session Equipment Utilized During Treatment: Gait belt Activity Tolerance: Patient limited by fatigue;Patient limited by lethargy Patient left: in bed;with call  bell/phone within reach;with bed alarm set;Other (comment)(back to bed for breakfast) Nurse Communication: Mobility status PT Visit Diagnosis: Unsteadiness on feet (R26.81);Difficulty in walking, not elsewhere classified (R26.2);Adult, failure to thrive (R62.7)    Time: 0750-0814 PT Time Calculation (min) (ACUTE ONLY): 24 min   Charges:   PT Evaluation $PT Eval Moderate Complexity: 1 Mod PT Treatments $Gait Training: 8-22 mins   PT G Codes:   PT G-Codes **NOT FOR INPATIENT CLASS** Functional Assessment Tool Used: AM-PAC 6 Clicks Basic Mobility    Ivar Drape 09/17/2017, 9:43 AM   Samul Dada, PT MS Acute Rehab Dept. Number: [facility] [id number] and [facility] [phone]

## 2017-09-17 NOTE — Progress Notes (Signed)
Inpatient Diabetes Program Recommendations  AACE/ADA: New Consensus Statement on Inpatient Glycemic Control (2015)  Target Ranges:  Prepandial:   less than 140 mg/dL      Peak postprandial:   less than 180 mg/dL (1-2 hours)      Critically ill patients:  140 - 180 mg/dL   Lab Results  Component Value Date   GLUCAP 232 (H) 09/17/2017    Review of Glycemic Control Results for CROWLEY, KRASNER (MRN [id number]) as of 09/17/2017 11:35  Ref. Range 09/16/2017 07:15 09/16/2017 12:53 09/16/2017 16:31 09/16/2017 21:06 09/17/2017 07:51  Glucose-Capillary Latest Ref Range: 65 - 99 mg/dL 93 159 (H) 458 (H) 592 (H) 232 (H)   Diabetes history: Type 2 DM Outpatient Diabetes medications:  Amaryl 4 mg bid, Metformin 850 mg daily, Januvia 100 mg daily Current orders for Inpatient glycemic control: Novolog moderate tid with meals and HS, Amaryl 4 mg bid, Tradjenta 5 mg daily  Inpatient Diabetes Program Recommendations:   Consider adding Lantus 15 units daily.  Also may consider holding Amaryl while patient is in the hospital.   Thanks,  Beryl Meager, RN, BC-ADM Inpatient Diabetes Coordinator Pager [phone] (8a-5p)

## 2017-09-17 NOTE — Progress Notes (Signed)
Advanced Heart Failure Rounding Note  Primary Cardiologist: Dr. Ladona Ridgelaylor HF: New (Dr. Gala RomneyBensimhon)  Subjective:    Feeling better today. Says breathing is better. No CP. More alert.  Worked with PT. They are recommending SNF.   Creatinine 2.03 -> 2.13 -> 2.34. Negative 800 cc but weight up 2 lbs. CBGs running high  Objective:   Weight Range: 179 lb 0.2 oz (81.2 kg) Body mass index is 25.69 kg/m.   Vital Signs:   Temp:  [97.6 F (36.4 C)-98.8 F (37.1 C)] 98.6 F (37 C) (11/05 0757) Pulse Rate:  [80-88] 88 (11/05 0757) Resp:  [18] 18 (11/05 0757) BP: (113-127)/(60-69) 113/62 (11/05 0757) SpO2:  [94 %-98 %] 97 % (11/05 0757) Weight:  [179 lb 0.2 oz (81.2 kg)] 179 lb 0.2 oz (81.2 kg) (11/05 0444) Last BM Date: 09/15/17  Weight change: Filed Weights   09/15/17 0835 09/16/17 0500 09/17/17 0444  Weight: 180 lb 11.2 oz (82 kg) 177 lb 11.1 oz (80.6 kg) 179 lb 0.2 oz (81.2 kg)    Intake/Output:   Intake/Output Summary (Last 24 hours) at 09/17/2017 1026 Last data filed at 09/17/2017 0945 Gross per 24 hour  Intake 1135 ml  Output 1400 ml  Net -265 ml      Physical Exam    General: Elderly male lying in bed. More alert today. No resp difficulty HEENT: Normal Neck: Supple. JVP 6-7 cm. Carotids 2+ bilat; no bruits. + R CEA scar. No lymphadenopathy or thyromegaly appreciated. Cor: PMI nondisplaced. Regular rate & rhythm. No rubs, gallops or murmurs. Lungs: Clear Abdomen: Soft, nontender, nondistended. No hepatosplenomegaly. No bruits or masses. Good bowel sounds. Extremities: No cyanosis, clubbing, rash, edema. S/p L BKA Neuro: Alert & orientedx3, cranial nerves grossly intact. Affect pleasant   Telemetry   NSR, 70-80s Personally reviewed.   EKG    NSR with sinus rhythm with 1st degree AV block. Personally reviewed.   Labs    CBC Recent Labs    09/14/17 2112  WBC 9.2  HGB 11.7*  HCT 35.8*  MCV 93.2  PLT 165   Basic Metabolic Panel Recent Labs   09/16/17 0333 09/17/17 1114  NA 137 134*  K 4.0 4.5  CL 106 104  CO2 20* 20*  GLUCOSE 94 324*  BUN 40* 43*  CREATININE 2.13* 2.34*  CALCIUM 8.8* 8.7*   Liver Function Tests No results for input(s): AST, ALT, ALKPHOS, BILITOT, PROT, ALBUMIN in the last 72 hours. No results for input(s): LIPASE, AMYLASE in the last 72 hours. Cardiac Enzymes No results for input(s): CKTOTAL, CKMB, CKMBINDEX, TROPONINI in the last 72 hours.  BNP: BNP (last 3 results) No results for input(s): BNP in the last 8760 hours.  ProBNP (last 3 results) No results for input(s): PROBNP in the last 8760 hours.  D-Dimer No results for input(s): DDIMER in the last 72 hours. Hemoglobin A1C No results for input(s): HGBA1C in the last 72 hours. Fasting Lipid Panel No results for input(s): CHOL, HDL, LDLCALC, TRIG, CHOLHDL, LDLDIRECT in the last 72 hours. Thyroid Function Tests No results for input(s): TSH, T4TOTAL, T3FREE, THYROIDAB in the last 72 hours.  Invalid input(s): FREET3  Other results:  Imaging    No results found.  Medications:     Scheduled Medications: . allopurinol  300 mg Oral Daily  . aspirin EC  81 mg Oral Daily  . atorvastatin  80 mg Oral q1800  . clopidogrel  75 mg Oral Daily  . colchicine  0.6 mg Oral  Daily  . ferrous sulfate  325 mg Oral QHS  . furosemide  80 mg Oral Daily  . glimepiride  4 mg Oral BID  . heparin  5,000 Units Subcutaneous Q8H  . insulin aspart  0-15 Units Subcutaneous TID WC  . insulin aspart  0-5 Units Subcutaneous QHS  . isosorbide mononitrate  30 mg Oral Daily  . linagliptin  5 mg Oral Daily  . pantoprazole  40 mg Oral Daily  . ranolazine  500 mg Oral BID  . sacubitril-valsartan  1 tablet Oral BID  . sodium chloride flush  3 mL Intravenous Q12H     Infusions: . sodium chloride       PRN Medications:  sodium chloride, acetaminophen, ondansetron (ZOFRAN) IV, oxyCODONE, sodium chloride flush  Patient Profile   76 y/o male with DM2, PAD s/p  L BKA, CKD stage IV CAD s/p CABG 2000 and systolic HF with EF 15% on cath 09/14/17 who was admitted with crescendo angina and was found to have severe native vessel and graft disease with EF 15% on this admission   Assessment/Plan   1. Acute on chronic systolic HF - EF on cath 11/2 EF 15-20%. ECHO pending - EF 60-65% by report on echo 1/13. Echo 11/12 EF 35% - LVEDP 26 on cath - Continue lasix 80 mg daily - Continue Entresto 24/26 mg BID - Metoprolol tartrate stopped 08/15/17. Start coreg 3.125 mg BID.   2. CAD s/p CABG 2000 with severe chronic angina - cath 11/2 with severe native vessel and graft disease without revascularization options - Continue Ranexa 500 mg BID - Continue imdur 30 mg daily.  3. PAD & carotid artery stenosis - s/p L BKA - followed by Dr. Darrick Penna  4. DM2  - Followed by Dr. Chilton Si.  - Avoid sulfonylureas if possible.   5. Acute on CKD, stage IV - Baseline creatinine 1.8-2.1 - Creatinine up slightly today with diuresis. Adjust diuretics. Continue to follow closely.   6. PAF - Has h/o PAF but not on blood thinners due to bleeding.  - ECG from 09/05/17 showed NSR but tele here shows some probable AF.  - EKG 09/16/17 with NSR with 1st degree AV block.   7. Deconditioning - PT recommends SNF.   Continue to adjust medications. Possible to SNF tomorrow vs Wednesday. Labs pending.   Length of Stay: 3  Luane School  09/17/2017, 10:26 AM  Advanced Heart Failure Team Pager [phone] (M-F; 7a - 4p)  Please contact [facility] for night-coverage after hours (4p -7a ) and weekends on [email or web address]  Patient seen and examined with the above-signed Advanced Practice Provider and/or Housestaff. I personally reviewed laboratory data, imaging studies and relevant notes. I independently examined the patient and formulated the important aspects of the plan. I have edited the note to reflect any of my changes or salient points. I have personally discussed the  plan with the patient and/or family.  HF improved. Breathing better. No orthopnea or PND. CP improved on Ranexa. Creatinine up slightly. Adjusting diuretics, PT has seen and recommending SNF. Family wants [facility],. Adjust insulin per Diabetes Coodinator's recs. Possibly to SNF in a, if renal function stable and he is feeling ok.   Arvilla Meres, MD  7:43 PM

## 2017-09-18 LAB — GLUCOSE, CAPILLARY
GLUCOSE-CAPILLARY: 218 mg/dL — AB (ref 65–99)
Glucose-Capillary: 235 mg/dL — ABNORMAL HIGH (ref 65–99)
Glucose-Capillary: 239 mg/dL — ABNORMAL HIGH (ref 65–99)

## 2017-09-18 LAB — BASIC METABOLIC PANEL
Anion gap: 12 (ref 5–15)
BUN: 40 mg/dL — AB (ref 6–20)
CALCIUM: 8.6 mg/dL — AB (ref 8.9–10.3)
CHLORIDE: 105 mmol/L (ref 101–111)
CO2: 19 mmol/L — AB (ref 22–32)
CREATININE: 2.24 mg/dL — AB (ref 0.61–1.24)
GFR calc non Af Amer: 26 mL/min — ABNORMAL LOW (ref >60)
GFR, EST AFRICAN AMERICAN: 31 mL/min — AB (ref >60)
Glucose, Bld: 227 mg/dL — ABNORMAL HIGH (ref 65–99)
Potassium: 4 mmol/L (ref 3.5–5.1)
Sodium: 136 mmol/L (ref 135–145)

## 2017-09-18 LAB — HEPATIC FUNCTION PANEL
ALBUMIN: 3.3 g/dL — AB (ref 3.5–5.0)
ALK PHOS: 78 U/L (ref 38–126)
ALT: 56 U/L (ref 17–63)
AST: 42 U/L — ABNORMAL HIGH (ref 15–41)
BILIRUBIN DIRECT: 0.2 mg/dL (ref 0.1–0.5)
BILIRUBIN INDIRECT: 0.9 mg/dL (ref 0.3–0.9)
BILIRUBIN TOTAL: 1.1 mg/dL (ref 0.3–1.2)
Total Protein: 6 g/dL — ABNORMAL LOW (ref 6.5–8.1)

## 2017-09-18 MED ORDER — INSULIN GLARGINE 100 UNIT/ML ~~LOC~~ SOLN: 10.0000 [IU] | Freq: Every day | SUBCUTANEOUS | 11 refills | Status: AC

## 2017-09-18 MED ORDER — FUROSEMIDE 80 MG PO TABS: 80.0000 mg | ORAL_TABLET | Freq: Every day | ORAL | 6 refills | Status: AC

## 2017-09-18 MED ORDER — CARVEDILOL 3.125 MG PO TABS: 3.1250 mg | ORAL_TABLET | Freq: Two times a day (BID) | ORAL | 6 refills | Status: AC

## 2017-09-18 MED ORDER — INSULIN ASPART 100 UNIT/ML ~~LOC~~ SOLN: 0.0000 [IU] | Freq: Three times a day (TID) | SUBCUTANEOUS | 11 refills | Status: AC

## 2017-09-18 MED ORDER — COLCHICINE 0.6 MG PO TABS: ORAL_TABLET | ORAL | Status: AC

## 2017-09-18 MED ORDER — ISOSORBIDE MONONITRATE ER 30 MG PO TB24: 30.0000 mg | ORAL_TABLET | Freq: Every day | ORAL | 3 refills | Status: AC

## 2017-09-18 MED ORDER — RANOLAZINE ER 500 MG PO TB12: 500.0000 mg | ORAL_TABLET | Freq: Two times a day (BID) | ORAL | 6 refills | Status: AC

## 2017-09-18 MED ORDER — SACUBITRIL-VALSARTAN 24-26 MG PO TABS: 1.0000 | ORAL_TABLET | Freq: Two times a day (BID) | ORAL | 3 refills | Status: AC

## 2017-09-18 MED ORDER — ATORVASTATIN CALCIUM 80 MG PO TABS: 80.0000 mg | ORAL_TABLET | Freq: Every day | ORAL | 6 refills | Status: AC

## 2017-09-18 NOTE — Progress Notes (Signed)
Patient going to SNF at discharge; Entresto coupon card given to Todd Cook SW to give to patient to use when he is discharged from the facility. Abelino Derrick [facility] [phone]

## 2017-09-18 NOTE — Progress Notes (Signed)
R/t discharge, called [facility] at [phone], spoke with Joni Reining and gave report.  Pt was transported to [facility] from [facility] via family.  Pt in stable condition with no complaints.

## 2017-09-18 NOTE — Progress Notes (Signed)
Patient stable during the night. No complaints of pain. Vital signs stable. Will continue to monitor.

## 2017-09-18 NOTE — Discharge Summary (Addendum)
Advanced Heart Failure Discharge Note  Discharge Summary   Patient ID: Todd Cook MRN: [id number], DOB/AGE: 1940-11-22 76 y.o. Admit date: 09/14/2017 D/C date:     09/18/2017   Primary Discharge Diagnoses:  1. Acute on chronic systolic HF 2. Crescendo/unstable  angina 3. CAD s/p CABG 2000with severe chronic angina 4. PAD & carotid artery stenosis s/p L BKA 5. DM2 6. Acute on CKD, stage IV 7. PAF 8. Deconditioning  Hospital Course:   Todd Cook is a 76 y.o. male with DM2, PAD s/p L BKA, CKD stage IV CAD s/p CABG 2000 and systolic HF with EF 15% on cath 09/14/17 who was admitted with crescendo angina and was found to have severe native vessel and graft disease with EF 15% on this admission  Pt admitted with worsening angina x 3 weeks. Limited relief from NTG. Cath as below with severe diabetic native vessel and graft disease with EF 15%. LVEDP 26. No good targets for revascularization. HF team consulted for further management.   Ranexa added and transitioned back to po diuretics. Aggressive adjustments made to medications, with transition to Entresto from losartan, and coreg from toprol tartrate.    Repeat Echo showed EF 20-25%, Mild AI, Mild MR, mild LAE, Mild RAE, PA peak pressure 39 mm Hg.   PT saw pt and recommended SNF for rehabilitation. HF meds continued to be titrated as tolerated. Creatinine stabilized.   Seen am of 08/18/17 and thought stable for discharge to SNF. Arrangements made for his medications.    Oral DM2 medications changed to insulin with renal dysfunction.  Lantus for basal insulin and Sliding Scale insulin ordered. This should be based on facility protocol at first, and further adjusted by patients PCP once he is discharged from there.   Overall pt diuresed 1.3 L though down 8 lbs from admission. He will be discharged to SNF in stable condition, with close follow up in the HF clinic as below.    Discharge Weight Range: 175 lbs Discharge Vitals:  Blood pressure (!) 106/58, pulse 94, temperature 98.4 F (36.9 C), temperature source Oral, resp. rate 18, height  (1.778 m), weight 175 lb (79.4 kg), SpO2 97 %.   Physical Exam General:  Elderly male lying flat in bed. No resp difficulty HEENT: normal Neck: supple. JVP 6 Carotids 2+ bilat; no bruits. No lymphadenopathy or thryomegaly appreciated. Cor: PMI nondisplaced. Regular rate & rhythm. 2/6 SEM  Lungs: clear Abdomen: obese soft, nontender, nondistended. No hepatosplenomegaly. No bruits or masses. Good bowel sounds. Extremities: no cyanosis, clubbing, rash, edema s/p L BKA Neuro: alert & orientedx3, cranial nerves grossly intact. moves all 4 extremities w/o difficulty. Affect pleasant   Labs: Lab Results  Component Value Date   WBC 9.2 09/14/2017   HGB 11.7 (L) 09/14/2017   HCT 35.8 (L) 09/14/2017   MCV 93.2 09/14/2017   PLT 165 09/14/2017    Recent Labs  Lab 09/18/17 0519  NA 136  K 4.0  CL 105  CO2 19*  BUN 40*  CREATININE 2.24*  CALCIUM 8.6*  PROT 6.0*  BILITOT 1.1  ALKPHOS 78  ALT 56  AST 42*  GLUCOSE 227*   No results found for: CHOL, HDL, LDLCALC, TRIG BNP (last 3 results) No results for input(s): BNP in the last 8760 hours.  ProBNP (last 3 results) No results for input(s): PROBNP in the last 8760 hours.   Diagnostic Studies/Procedures   LHC 09/14/2017  Ischemic cardiomyopathy with acute on chronic systolic heart failure.  LVEF approximately 15%.  LVEDP 26 mmHg.  Patent but diseased left main  Diffusely diseased proximal to mid LAD, mid 99% LAD, and mid to distal LAD is totally occluded.  The large first diagonal contains 99% stenosis.  The vessel is diffusely diseased and was previously grafted.  Circumflex is totally occluded proximally.  Ostial right coronary is totally occluded.   LIMA to LAD is previously known to be totally occluded.  SVG to the right coronary is totally occluded within the previously placed stent (2012).  SVG to  the proximal obtuse marginal contains severe diffuse 85-95% stenosis before ending on a small diffusely diseased native branch.  The native branch contains diffuse 80-90% narrowing.  SVG to the distal obtuse marginal contains proximal to mid diffuse disease up to 50% but is otherwise widely patent.  Discharge Medications   Allergies as of 09/18/2017      Reactions   Ramipril Anaphylaxis      Medication List    STOP taking these medications   glimepiride 4 MG tablet Commonly known as:  AMARYL   losartan 100 MG tablet Commonly known as:  COZAAR   metFORMIN 850 MG tablet Commonly known as:  GLUCOPHAGE   metoprolol tartrate 50 MG tablet Commonly known as:  LOPRESSOR   simvastatin 20 MG tablet Commonly known as:  ZOCOR   sitaGLIPtin 100 MG tablet Commonly known as:  JANUVIA     TAKE these medications   allopurinol 300 MG tablet Commonly known as:  ZYLOPRIM Take 300 mg by mouth daily.   aspirin EC 81 MG tablet Take 81 mg by mouth at bedtime.   atorvastatin 80 MG tablet Commonly known as:  LIPITOR Take 1 tablet (80 mg total) daily at 6 PM by mouth.   carvedilol 3.125 MG tablet Commonly known as:  COREG Take 1 tablet (3.125 mg total) 2 (two) times daily with a meal by mouth.   clopidogrel 75 MG tablet Commonly known as:  PLAVIX Take 75 mg by mouth daily.   colchicine 0.6 MG tablet As needed for gout flare. What changed:    how much to take  how to take this  when to take this  additional instructions   FERROUSUL 325 (65 FE) MG tablet Generic drug:  ferrous sulfate Take 325 mg by mouth at bedtime.   furosemide 80 MG tablet Commonly known as:  LASIX Take 1 tablet (80 mg total) daily by mouth. Start taking on:  09/19/2017   insulin aspart 100 UNIT/ML injection Commonly known as:  novoLOG Inject 0-15 Units 3 (three) times daily with meals into the skin. Sliding scale base on SNF protocols.   Per SNF MD.   insulin glargine 100 UNIT/ML injection Commonly  known as:  LANTUS Inject 0.1 mLs (10 Units total) at bedtime into the skin.   isosorbide mononitrate 30 MG 24 hr tablet Commonly known as:  IMDUR Take 1 tablet (30 mg total) daily by mouth. What changed:  when to take this   omeprazole 20 MG capsule Commonly known as:  PRILOSEC Take 20 mg by mouth 2 (two) times daily before a meal.   ranolazine 500 MG 12 hr tablet Commonly known as:  RANEXA Take 1 tablet (500 mg total) 2 (two) times daily by mouth.   sacubitril-valsartan 24-26 MG Commonly known as:  ENTRESTO Take 1 tablet 2 (two) times daily by mouth.       Disposition   The patient will be discharged in stable condition to SNF  Discharge Instructions    (  HEART FAILURE PATIENTS) Call MD:  Anytime you have any of the following symptoms: 1) 3 pound weight gain in 24 hours or 5 pounds in 1 week 2) shortness of breath, with or without a dry hacking cough 3) swelling in the hands, feet or stomach 4) if you have to sleep on extra pillows at night in order to breathe.   Complete by:  As directed    Diet - low sodium heart healthy   Complete by:  As directed    Heart Failure patients record your daily weight using the same scale at the same time of day   Complete by:  As directed    Increase activity slowly   Complete by:  As directed       Contact information for follow-up providers    [facility] Follow up on 2017-06-05.   Specialty:  Cardiology Why:  At 2 pm post hospital follow up. Code for parking is 8001. Contact information: [address] [id number] [place] [id number] [phone]           Contact information for after-discharge care    Destination    [facility] Follow up.   Service:  Skilled Nursing Contact information: [address] [place] [id number] [phone]                    Duration of Discharge Encounter: Greater than 35  minutes   Signed, Luane SchoolMichael Andrew Tillery, PA-C 09/18/2017, 1:51 PM   Patient seen and examined with the above-signed Advanced Practice Provider and/or Housestaff. I personally reviewed laboratory data, imaging studies and relevant notes. I independently examined the patient and formulated the important aspects of the plan. I have edited the note to reflect any of my changes or salient points. I have personally discussed the plan with the patient and/or family.  He is much improved after diuresis and adjustment of his anti-anginal and HF regimens. Agree with transfer to [facility] today. Not candidate for advanced HF therapies with memory loss, PAD and CKD. Will f/u in HF Clinic next week.   Arvilla MeresBensimhon, Daniel, MD  1:58 PM

## 2017-09-18 NOTE — Progress Notes (Signed)
Orders received for pt discharge.  Discharge summary printed and reviewed with pt.  Although pt is discharged to SNF, this nurse did explained medication regimen, and pt had no further questions at this time.  IV removed and site remains clean, dry, intact.  Telemetry removed.  Pt in stable condition and awaiting transport.

## 2017-09-18 NOTE — Clinical Social Work Placement (Signed)
   CLINICAL SOCIAL WORK PLACEMENT  NOTE  Date:  09/18/2017  Patient Details  Name: Todd Cook MRN: [id number] Date of Birth: [birth year]  Clinical Social Work is seeking post-discharge placement for this patient at the Skilled  Nursing Facility level of care (*CSW will initial, date and re-position this form in  chart as items are completed):  Yes   Patient/family provided with [facility] Clinical Social Work Department's list of facilities offering this level of care within the geographic area requested by the patient (or if unable, by the patient's family).  Yes   Patient/family informed of their freedom to choose among providers that offer the needed level of care, that participate in Medicare, Medicaid or managed care program needed by the patient, have an available bed and are willing to accept the patient.  Yes   Patient/family informed of [facility]'s ownership interest in [facility] and [facility], as well as of the fact that they are under no obligation to receive care at these facilities.  PASRR submitted to EDS on 09/18/17     PASRR number received on 09/18/17     Existing PASRR number confirmed on       FL2 transmitted to all facilities in geographic area requested by pt/family on 09/18/17     FL2 transmitted to all facilities within larger geographic area on       Patient informed that his/her managed care company has contracts with or will negotiate with certain facilities, including the following:        Yes   Patient/family informed of bed offers received.  Patient chooses bed at [facility]     Physician recommends and patient chooses bed at      Patient to be transferred to [facility] on 09/18/17.  Patient to be transferred to facility by Family to transport     Patient family notified on 09/18/17 of transfer.  Name of family member notified:  Nils Flack and daughter at bedside.     PHYSICIAN Please  prepare prescriptions     Additional Comment:    _______________________________________________ Margarito Liner, LCSW 09/18/2017, 5:21 PM

## 2017-09-18 NOTE — Clinical Social Work Placement (Signed)
   CLINICAL SOCIAL WORK PLACEMENT  NOTE  Date:  09/18/2017  Patient Details  Name: Todd Cook MRN: [id number] Date of Birth: Jun 04, 1941  Clinical Social Work is seeking post-discharge placement for this patient at the Skilled  Nursing Facility level of care (*CSW will initial, date and re-position this form in  chart as items are completed):  Yes   Patient/family provided with [facility] Clinical Social Work Department's list of facilities offering this level of care within the geographic area requested by the patient (or if unable, by the patient's family).  Yes   Patient/family informed of their freedom to choose among providers that offer the needed level of care, that participate in Medicare, Medicaid or managed care program needed by the patient, have an available bed and are willing to accept the patient.  Yes   Patient/family informed of 's ownership interest in [facility] and [facility], as well as of the fact that they are under no obligation to receive care at these facilities.  PASRR submitted to EDS on 09/18/17     PASRR number received on 09/18/17     Existing PASRR number confirmed on       FL2 transmitted to all facilities in geographic area requested by pt/family on 09/18/17     FL2 transmitted to all facilities within larger geographic area on       Patient informed that his/her managed care company has contracts with or will negotiate with certain facilities, including the following:            Patient/family informed of bed offers received.  Patient chooses bed at       Physician recommends and patient chooses bed at      Patient to be transferred to   on  .  Patient to be transferred to facility by       Patient family notified on   of transfer.  Name of family member notified:        PHYSICIAN Please sign FL2     Additional Comment:    _______________________________________________ Margarito Liner, LCSW 09/18/2017,  9:53 AM

## 2017-09-18 NOTE — Clinical Social Work Note (Signed)
CSW facilitated patient discharge including contacting patient family and facility to confirm patient discharge plans. Clinical information faxed to facility and family agreeable with plan. Patient's wife and daughter will transport by car to [facility]. RN to call report prior to discharge [phone] Room 806B).  CSW will sign off for now as social work intervention is no longer needed. Please consult [place] again if new needs arise.  Charlynn Court, CSW [phone]

## 2017-09-18 NOTE — Evaluation (Signed)
Occupational Therapy Evaluation Patient Details Name: Todd Cook MRN: [id number] DOB: June 16, 1941 Today's Date: 09/18/2017    History of Present Illness 76 yo male with onset of  SOB and chest pain and received L heart cath and cors/grafts angiography on 11/2, now has CHF with diuresing taking place. Cardiology reports poor prognosis. PMHx:  CHF, CAD with CABG 2000, angina chronically, DM, CKD 5, PAF, L BKA with prosthesis and previously ambulating short distances.   Clinical Impression   Pt was assisted for tub transfers and IADL and ambulated with AD at his baseline. Pt has been increasingly weak over the last 3 weeks, needing more assist and use of w/c. Pt presents with generalized weakness and impaired standing balance requiring moderate assistance for OOB. He needs supervision/set up to max assist for ADL. Recommending SNF for ST rehab prior to return home with his supportive family.     Follow Up Recommendations  SNF    Equipment Recommendations       Recommendations for Other Services       Precautions / Restrictions Precautions Precautions: Fall Required Braces or Orthoses: Other Brace/Splint(L LE prosthesis) Restrictions Weight Bearing Restrictions: No      Mobility Bed Mobility Overal bed mobility: Needs Assistance Bed Mobility: Supine to Sit;Sit to Supine     Supine to sit: Min assist Sit to supine: Min guard   General bed mobility comments: min assist to raise trunk, cues for technique  Transfers Overall transfer level: Needs assistance Equipment used: Rolling walker (2 wheeled) Transfers: Sit to/from UGI CorporationStand;Stand Pivot Transfers Sit to Stand: Mod assist Stand pivot transfers: Mod assist            Balance Overall balance assessment: Needs assistance   Sitting balance-Leahy Scale: Fair     Standing balance support: Bilateral upper extremity supported Standing balance-Leahy Scale: Poor                             ADL either  performed or assessed with clinical judgement   ADL Overall ADL's : Needs assistance/impaired Eating/Feeding: Independent;Sitting   Grooming: Set up;Sitting;Supervision/safety   Upper Body Bathing: Supervision/ safety;Sitting;Set up   Lower Body Bathing: Maximal assistance;Sit to/from stand   Upper Body Dressing : Set up;Supervision/safety;Sitting   Lower Body Dressing: Maximal assistance;Sit to/from stand   Toilet Transfer: Moderate assistance;Stand-pivot;BSC;RW   Toileting- Clothing Manipulation and Hygiene: Maximal assistance;Sit to/from stand               Vision Baseline Vision/History: Wears glasses Wears Glasses: Reading only Patient Visual Report: No change from baseline       Perception     Praxis      Pertinent Vitals/Pain Pain Assessment: No/denies pain     Hand Dominance Right   Extremity/Trunk Assessment Upper Extremity Assessment Upper Extremity Assessment: Overall WFL for tasks assessed   Lower Extremity Assessment Lower Extremity Assessment: Defer to PT evaluation LLE Deficits / Details: L BK residual limb   Cervical / Trunk Assessment Cervical / Trunk Assessment: Kyphotic   Communication Communication Communication: HOH(has hearing aids)   Cognition Arousal/Alertness: Awake/alert Behavior During Therapy: WFL for tasks assessed/performed Overall Cognitive Status: Within Functional Limits for tasks assessed                                 General Comments: jokes a lot, somewhat slow response speed, wife answers questions for him frequently  General Comments       Exercises     Shoulder Instructions      Home Living Family/patient expects to be discharged to:: Private residence Living Arrangements: Spouse/significant other Available Help at Discharge: Family;Available 24 hours/day Type of Home: House       Home Layout: One level     Bathroom Shower/Tub: Chief Strategy Officer: Standard      Home Equipment: Environmental consultant - 2 wheels;Cane - quad;Bedside commode;Tub bench          Prior Functioning/Environment Level of Independence: Needs assistance  Gait / Transfers Assistance Needed: walks short distances with prosthesis and AD, most recently was needing a w/c ADL's / Homemaking Assistance Needed: wife helps with tub transfer and does all IADL            OT Problem List: Impaired balance (sitting and/or standing);Decreased activity tolerance;Decreased knowledge of use of DME or AE      OT Treatment/Interventions:      OT Goals(Current goals can be found in the care plan section) Acute Rehab OT Goals Patient Stated Goal: to walk again and get home  OT Frequency:     Barriers to D/C:            Co-evaluation              AM-PAC PT "6 Clicks" Daily Activity     Outcome Measure Help from another person eating meals?: None Help from another person taking care of personal grooming?: A Little Help from another person toileting, which includes using toliet, bedpan, or urinal?: A Lot Help from another person bathing (including washing, rinsing, drying)?: A Lot Help from another person to put on and taking off regular upper body clothing?: A Little Help from another person to put on and taking off regular lower body clothing?: A Lot 6 Click Score: 16   End of Session Equipment Utilized During Treatment: Gait belt;Rolling walker  Activity Tolerance: Patient tolerated treatment well Patient left: in bed;with call bell/phone within reach;with bed alarm set;with family/visitor present  OT Visit Diagnosis: Other abnormalities of gait and mobility (R26.89)                Time: 9024-0973 OT Time Calculation (min): 17 min Charges:  OT General Charges $OT Visit: 1 Visit OT Evaluation $OT Eval Moderate Complexity: 1 Mod G-Codes:     2017/09/20 Martie Round, OTR/L Pager: [phone]  Iran Planas, [place] Bailiff 09/20/17, 11:27 AM

## 2017-09-18 NOTE — Clinical Social Work Note (Signed)
Clinical Social Work Assessment  Patient Details  Name: Todd Cook MRN: [id number] Date of Birth: 11-07-1941  Date of referral:  09/18/17               Reason for consult:  Facility Placement, Discharge Planning                Permission sought to share information with:  Facility Sport and exercise psychologist, Family Supports Permission granted to share information::  Yes, Verbal Permission Granted  Name::     Todd Cook  Agency::  [facility]  Relationship::  Wife  Contact Information:  [phone]  Housing/Transportation Living arrangements for the past 2 months:  Single Family Home Source of Information:  Patient, Medical Team, Adult Children, Spouse Patient Interpreter Needed:  None Criminal Activity/Legal Involvement Pertinent to Current Situation/Hospitalization:  No - Comment as needed Significant Relationships:  Adult Children, Spouse Lives with:  Spouse Do you feel safe going back to the place where you live?  Yes Need for family participation in patient care:  Yes (Comment)  Care giving concerns:  PT recommending SNF once medically stable for discharge.   Social Worker assessment / plan:  CSW met with patient. Wife and daughter at bedside. CSW introduced role and explained that PT recommendations would be discussed. Patient and family agreeable to SNF placement and state they have already spoken with upper management at [facility] and they stated they would be able to accommodate him. CSW called admissions coordinator. She was unaware of him but will review referral and stated they should be able to take him. She will call CSW back once she has reviewed the referral. Patient's family will transport by car. No further concerns. CSW encouraged patient and his family to contact CSW as needed. CSW will continue to follow patient and his family for support and facilitate discharge to SNF today, per RN.  Employment status:  Retired Designer, industrial/product PT Recommendations:  [place] / Referral to community resources:  [place]  Patient/Family's Response to care:  Patient and his family agreeable to SNF placement. Patient's family supportive and involved in patient's care. Patient and his family appreciated social work intervention.  Patient/Family's Understanding of and Emotional Response to Diagnosis, Current Treatment, and Prognosis:  Patient and his family have a good understanding of the reason for admission and his need for rehab prior to returning home. Patient and his family appear happy with hospital care.  Emotional Assessment Appearance:  Appears stated age Attitude/Demeanor/Rapport:  Other(Pleasant) Affect (typically observed):  Accepting, Appropriate, Calm, Pleasant Orientation:  Oriented to Self, Oriented to Place, Oriented to Situation, Oriented to  Time Alcohol / Substance use:  Never Used Psych involvement (Current and /or in the community):  No (Comment)  Discharge Needs  Concerns to be addressed:  Care Coordination Readmission within the last 30 days:  No Current discharge risk:  Dependent with Mobility Barriers to Discharge:  [place], LCSW 09/18/2017, 9:50 AM

## 2017-09-18 NOTE — Clinical Social Work Note (Addendum)
[facility] can accept patient today. It will be a semiprivate room. Patient will have to bring his own supply of Entresto, Ranexa, and Tradjenta if still on them at discharge due to cost. Patient and family notified. CSW faxed clinicals to [facility] for authorization review. This will take approximately 4 hours to complete. Insurance will likely request an OT evaluation. Order put in and CSW spoke with Toniann Fail in the acute rehab department. She will send out a page to have patient seen as soon as possible.  Charlynn Court, CSW [phone]  11:47 am CSW confirmed [facility] received the clinicals that were faxed over. CSW faxed OT evaluation.  Charlynn Court, CSW [phone]  12:36 pm Per Heart Failure PA, patient will definitely discharge on Entresto and Ranexa. He is checking to see if he will discharge to Tradjenta.  Charlynn Court, CSW [phone]  1:24 pm CSW received call from [facility] admissions coordinator. They are agreeable to filling Entresto and Ranexa if MD can switch Tradjenta back to [place]. PA is agreeable to this. Patient and his wife notified and wife will bring his own supply of [place] from home. CSW was given [place] card from [facility]. Presented to patient's wife for when patient discharges home from SNF.  Charlynn Court, CSW [phone]  3:33 pm CSW called [employer] to check the status of the patient's authorization. The case worker is Jenine and she is currently on a break. The individual that answered the call stated she would send her a high importance email to have her call CSW when she was back.  Charlynn Court, CSW [phone]  4:24 pm Authorization still pending. CSW left voicemail for pre-service coordinator handling the case.  Charlynn Court, CSW [phone]  5:08 pm [facility] office is now closed and CSW has not received call back from pre-service coordinator. CSW discussed 5-day LOG with admissions coordinator. It was approved by their  administrator. CSW left voicemail for Chiropodist of social work to see if he will approve as well. Awaiting call back.  Charlynn Court, CSW [phone]  5:20 pm LOG approved.   Charlynn Court, CSW [phone]

## 2017-09-18 NOTE — [place] FL2 (Signed)
[place] MEDICAID FL2 LEVEL OF CARE SCREENING TOOL     IDENTIFICATION  Patient Name: Todd Cook Birthdate: 29-May-1941 Sex: male Admission Date (Current Location): 09/14/2017  [facility] and [place] Number:  Producer, television/film/video and Address:  [facility] [place], [address], [place], [place] [address]      Provider Number: [id number]  Attending Physician Name and Address:  Lyn Records, MD  Relative Name and Phone Number:       Current Level of Care: Hospital Recommended Level of Care: Skilled Nursing Facility Prior Approval Number:    Date Approved/Denied:   PASRR Number: [id number]  Discharge Plan: SNF    Current Diagnoses: Patient Active Problem List   Diagnosis Date Noted  . Pressure injury of skin 09/15/2017  . Acute on chronic combined systolic and diastolic heart failure (HCC) 09/14/2017  . Chronic systolic heart failure (HCC)   . Preop cardiovascular exam 07/07/2014  . Carotid artery stenosis without cerebral infarction 06/01/2014  . Atherosclerosis of native arteries of the extremities, unspecified 10/26/2011  . Coronary atherosclerosis of artery bypass graft 10/24/2011  . Hyperlipidemia 10/24/2011  . S/P BKA (below knee amputation) (HCC) 10/02/2011  . PVD (peripheral vascular disease) (HCC) 09/29/2011  . Cardiomyopathy, ischemic 09/20/2011  . Non-STEMI (non-ST elevated myocardial infarction) (HCC) 09/18/2011  . 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 Osteomyelitis (HCC) 09/18/2011  . DM (diabetes mellitus) type I uncontrolled, periph vascular disorder (HCC) 09/18/2011  . Diverticulosis of colon 09/18/2011  . S/P carotid endarterectomy 09/18/2011  . Iron deficiency anemia 09/18/2011  . Gout attack 09/18/2011  . HTN (hypertension) 09/18/2011  . Nephrolithiasis 09/18/2011    Orientation RESPIRATION BLADDER Height & Weight     Self, Time, Situation, Place  Normal Continent Weight: 175 lb (79.4 kg)(a scale) Height:  5\' 10"  (177.8 cm)   BEHAVIORAL SYMPTOMS/MOOD NEUROLOGICAL BOWEL NUTRITION STATUS  (None) (None) Continent Diet(Heart healthy/carb modified)  AMBULATORY STATUS COMMUNICATION OF NEEDS Skin   Limited Assist Verbally Bruising, PU Stage and Appropriate Care, Other (Comment)(Amputation) PU Stage 1 Dressing: (Right, left, medial buttocks: Foam prn.)                     Personal Care Assistance Level of Assistance  Bathing, Feeding, Dressing Bathing Assistance: Limited assistance Feeding assistance: Independent Dressing Assistance: Limited assistance     Functional Limitations Info  Sight, Hearing, Speech Sight Info: Adequate Hearing Info: Adequate Speech Info: Adequate    SPECIAL CARE FACTORS FREQUENCY  PT (By licensed PT), Blood pressure, OT (By licensed OT)     PT Frequency: 5 x week OT Frequency: 5 x week            Contractures Contractures Info: Not present    Additional Factors Info  Code Status, Allergies Code Status Info: Full Allergies Info: Ramipril           Current Medications (09/18/2017):  This is the current hospital active medication list Current Facility-Administered Medications  Medication Dose Route Frequency Provider Last Rate Last Dose  . 0.9 %  sodium chloride infusion  250 mL Intravenous PRN Lyn Records, MD      . acetaminophen (TYLENOL) tablet 650 mg  650 mg Oral Q4H PRN Lyn Records, MD      . allopurinol (ZYLOPRIM) tablet 300 mg  300 mg Oral Daily Lyn Records, MD   300 mg at 09/17/17 8527  . aspirin EC tablet 81 mg  81 mg Oral Daily Katrinka Blazing,  Barry DienesHenry W, MD   81 mg at 09/17/17 [id number]  . atorvastatin (LIPITOR) tablet 80 mg  80 mg Oral q1800 Lars MassonNelson, Katarina H, MD   80 mg at 09/17/17 1730  . carvedilol (COREG) tablet 3.125 mg  3.125 mg Oral BID WC Graciella Freerillery, Michael Andrew, PA-C   3.125 mg at 09/18/17 [id number]  . clopidogrel (PLAVIX) tablet 75 mg  75 mg Oral Daily Lyn RecordsSmith, Henry W, MD   75 mg at 09/17/17 [id number]  . colchicine tablet 0.6 mg  0.6 mg Oral Daily Lyn RecordsSmith, Henry W,  MD   0.6 mg at 09/17/17 0941  . ferrous sulfate tablet 325 mg  325 mg Oral QHS Lyn RecordsSmith, Henry W, MD   325 mg at 09/17/17 2248  . furosemide (LASIX) tablet 80 mg  80 mg Oral Daily Bensimhon, Bevelyn Bucklesaniel R, MD   80 mg at 09/17/17 0942  . glimepiride (AMARYL) tablet 4 mg  4 mg Oral BID WC Lyn RecordsSmith, Henry W, MD   4 mg at 09/17/17 1730  . heparin injection 5,000 Units  5,000 Units Subcutaneous Q8H Lyn RecordsSmith, Henry W, MD   5,000 Units at 09/18/17 [phone]  . insulin aspart (novoLOG) injection 0-15 Units  0-15 Units Subcutaneous TID WC Bensimhon, Bevelyn Bucklesaniel R, MD   3 Units at 09/17/17 1729  . insulin aspart (novoLOG) injection 0-5 Units  0-5 Units Subcutaneous QHS Bensimhon, Daniel R, MD      . insulin glargine (LANTUS) injection 10 Units  10 Units Subcutaneous QHS Bensimhon, Bevelyn Bucklesaniel R, MD   10 Units at 09/17/17 2249  . isosorbide mononitrate (IMDUR) 24 hr tablet 30 mg  30 mg Oral Daily Lyn RecordsSmith, Henry W, MD   30 mg at 09/17/17 0941  . linagliptin (TRADJENTA) tablet 5 mg  5 mg Oral Daily Lyn RecordsSmith, Henry W, MD   5 mg at 09/17/17 0941  . ondansetron (ZOFRAN) injection 4 mg  4 mg Intravenous Q6H PRN Lyn RecordsSmith, Henry W, MD      . oxyCODONE (Oxy IR/ROXICODONE) immediate release tablet 5-10 mg  5-10 mg Oral Q4H PRN Lyn RecordsSmith, Henry W, MD      . pantoprazole (PROTONIX) EC tablet 40 mg  40 mg Oral Daily Lyn RecordsSmith, Henry W, MD   40 mg at 09/17/17 0942  . ranolazine (RANEXA) 12 hr tablet 500 mg  500 mg Oral BID Bensimhon, Bevelyn Bucklesaniel R, MD   500 mg at 09/17/17 2248  . sacubitril-valsartan (ENTRESTO) 24-26 mg per tablet  1 tablet Oral BID Bensimhon, Bevelyn Bucklesaniel R, MD   1 tablet at 09/17/17 2250  . sodium chloride flush (NS) 0.9 % injection 3 mL  3 mL Intravenous Q12H Lyn RecordsSmith, Henry W, MD   3 mL at 09/17/17 2250  . sodium chloride flush (NS) 0.9 % injection 3 mL  3 mL Intravenous PRN Lyn RecordsSmith, Henry W, MD         Discharge Medications: Please see discharge summary for a list of discharge medications.  Relevant Imaging Results:  Relevant Lab Results:   Additional  Information SS#: [id number]  Margarito LinerSarah C Karmella Bouvier, LCSW

## 2017-09-24 ENCOUNTER — Telehealth (HOSPITAL_COMMUNITY): Payer: Self-pay | Admitting: Pharmacist

## 2017-09-24 NOTE — Telephone Encounter (Signed)
Entresto 24-26 mg BID PA approved by [facility] through 09/22/19.   Tyler Deis. Bonnye Fava, PharmD, BCPS, CPP Clinical Pharmacist Pager: [phone] Phone: [phone] 09/24/2017 11:28 AM

## 2017-09-26 ENCOUNTER — Encounter (HOSPITAL_COMMUNITY): Payer: Medicare HMO

## 2017-10-13 DEATH — deceased

## 2018-02-21 ENCOUNTER — Ambulatory Visit: Payer: Medicare HMO | Admitting: Vascular Surgery

## 2018-02-21 ENCOUNTER — Encounter (HOSPITAL_COMMUNITY): Payer: Medicare HMO
